# Patient Record
Sex: Female | Born: 1945
Health system: Southern US, Community
[De-identification: ages and names within clinical notes are randomized; demographics above are authoritative.]

## PROBLEM LIST (undated history)

## (undated) DIAGNOSIS — K219 Gastro-esophageal reflux disease without esophagitis: Secondary | ICD-10-CM

## (undated) DIAGNOSIS — I10 Essential (primary) hypertension: Secondary | ICD-10-CM

## (undated) DIAGNOSIS — E049 Nontoxic goiter, unspecified: Secondary | ICD-10-CM

## (undated) DIAGNOSIS — R229 Localized swelling, mass and lump, unspecified: Secondary | ICD-10-CM

## (undated) DIAGNOSIS — J40 Bronchitis, not specified as acute or chronic: Secondary | ICD-10-CM

## (undated) DIAGNOSIS — C4491 Basal cell carcinoma of skin, unspecified: Secondary | ICD-10-CM

## (undated) DIAGNOSIS — Z5189 Encounter for other specified aftercare: Secondary | ICD-10-CM

## (undated) DIAGNOSIS — D869 Sarcoidosis, unspecified: Secondary | ICD-10-CM

## (undated) DIAGNOSIS — T7840XA Allergy, unspecified, initial encounter: Secondary | ICD-10-CM

## (undated) DIAGNOSIS — E079 Disorder of thyroid, unspecified: Secondary | ICD-10-CM

## (undated) DIAGNOSIS — B019 Varicella without complication: Secondary | ICD-10-CM

## (undated) DIAGNOSIS — J302 Other seasonal allergic rhinitis: Secondary | ICD-10-CM

## (undated) HISTORY — DX: Basal cell carcinoma of skin, unspecified: C44.91

## (undated) HISTORY — PX: COSMETIC SURGERY: SHX468

## (undated) HISTORY — PX: JOINT REPLACEMENT: SHX530

## (undated) HISTORY — DX: Localized swelling, mass and lump, unspecified: R22.9

## (undated) HISTORY — DX: Sarcoidosis, unspecified: D86.9

## (undated) HISTORY — DX: Allergy, unspecified, initial encounter: T78.40XA

## (undated) HISTORY — DX: Nontoxic goiter, unspecified: E04.9

## (undated) HISTORY — DX: Disorder of thyroid, unspecified: E07.9

## (undated) HISTORY — DX: Varicella without complication: B01.9

## (undated) HISTORY — PX: CHOLECYSTECTOMY: SHX55

## (undated) HISTORY — DX: Gastro-esophageal reflux disease without esophagitis: K21.9

## (undated) HISTORY — PX: FRACTURE SURGERY: SHX138

## (undated) HISTORY — DX: Encounter for other specified aftercare: Z51.89

## (undated) HISTORY — DX: Other seasonal allergic rhinitis: J30.2

## (undated) HISTORY — DX: Bronchitis, not specified as acute or chronic: J40

## (undated) HISTORY — PX: POLYPECTOMY: SHX149

## (undated) HISTORY — DX: Essential (primary) hypertension: I10

---
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
## 1976-12-27 HISTORY — PX: GALLBLADDER SURGERY: SHX652

## 1976-12-27 HISTORY — PX: APPENDECTOMY: SHX54

## 1977-12-27 HISTORY — PX: CARPAL TUNNEL RELEASE: SHX101

## 1978-12-27 HISTORY — PX: CARPAL TUNNEL RELEASE: SHX101

## 1981-12-27 HISTORY — PX: TUBAL LIGATION: SHX77

## 1990-12-27 HISTORY — PX: ABDOMINAL HYSTERECTOMY: SHX81

## 2000-09-12 ENCOUNTER — Encounter: Admission: RE | Admit: 2000-09-12 | Discharge: 2000-09-12 | Payer: Self-pay | Admitting: Family Medicine

## 2000-09-12 ENCOUNTER — Encounter: Payer: Self-pay | Admitting: Family Medicine

## 2001-09-13 ENCOUNTER — Encounter: Payer: Self-pay | Admitting: Family Medicine

## 2001-09-13 ENCOUNTER — Encounter: Admission: RE | Admit: 2001-09-13 | Discharge: 2001-09-13 | Payer: Self-pay | Admitting: Family Medicine

## 2002-10-22 ENCOUNTER — Encounter: Admission: RE | Admit: 2002-10-22 | Discharge: 2002-10-22 | Payer: Self-pay | Admitting: Orthopedic Surgery

## 2002-10-24 ENCOUNTER — Encounter (INDEPENDENT_AMBULATORY_CARE_PROVIDER_SITE_OTHER): Payer: Self-pay | Admitting: *Deleted

## 2002-10-24 ENCOUNTER — Ambulatory Visit (HOSPITAL_BASED_OUTPATIENT_CLINIC_OR_DEPARTMENT_OTHER): Admission: RE | Admit: 2002-10-24 | Discharge: 2002-10-24 | Payer: Self-pay | Admitting: Orthopedic Surgery

## 2002-11-01 ENCOUNTER — Encounter: Admission: RE | Admit: 2002-11-01 | Discharge: 2002-11-01 | Payer: Self-pay | Admitting: Family Medicine

## 2002-11-01 ENCOUNTER — Encounter: Payer: Self-pay | Admitting: Family Medicine

## 2003-11-12 ENCOUNTER — Encounter: Admission: RE | Admit: 2003-11-12 | Discharge: 2003-11-12 | Payer: Self-pay | Admitting: Family Medicine

## 2004-01-03 ENCOUNTER — Ambulatory Visit (HOSPITAL_COMMUNITY): Admission: RE | Admit: 2004-01-03 | Discharge: 2004-01-03 | Payer: Self-pay | Admitting: General Surgery

## 2004-01-03 ENCOUNTER — Encounter (INDEPENDENT_AMBULATORY_CARE_PROVIDER_SITE_OTHER): Payer: Self-pay | Admitting: Specialist

## 2004-11-10 ENCOUNTER — Ambulatory Visit: Payer: Self-pay | Admitting: Cardiovascular Disease

## 2004-11-11 ENCOUNTER — Ambulatory Visit: Payer: Self-pay | Admitting: Internal Medicine

## 2004-11-20 ENCOUNTER — Ambulatory Visit: Payer: Self-pay

## 2004-11-26 LAB — HM COLONOSCOPY

## 2004-11-27 ENCOUNTER — Ambulatory Visit: Payer: Self-pay | Admitting: Internal Medicine

## 2004-12-18 ENCOUNTER — Encounter: Admission: RE | Admit: 2004-12-18 | Discharge: 2004-12-18 | Payer: Self-pay | Admitting: Internal Medicine

## 2005-06-26 DIAGNOSIS — C4491 Basal cell carcinoma of skin, unspecified: Secondary | ICD-10-CM

## 2005-06-26 HISTORY — DX: Basal cell carcinoma of skin, unspecified: C44.91

## 2005-12-03 ENCOUNTER — Other Ambulatory Visit: Admission: RE | Admit: 2005-12-03 | Discharge: 2005-12-03 | Payer: Self-pay | Admitting: Endocrinology

## 2005-12-03 ENCOUNTER — Encounter (INDEPENDENT_AMBULATORY_CARE_PROVIDER_SITE_OTHER): Payer: Self-pay | Admitting: Specialist

## 2005-12-03 ENCOUNTER — Ambulatory Visit: Payer: Self-pay | Admitting: Endocrinology

## 2006-12-27 LAB — HM PAP SMEAR

## 2007-12-28 DIAGNOSIS — IMO0002 Reserved for concepts with insufficient information to code with codable children: Secondary | ICD-10-CM

## 2007-12-28 HISTORY — PX: TIBIA FRACTURE SURGERY: SHX806

## 2007-12-28 HISTORY — DX: Reserved for concepts with insufficient information to code with codable children: IMO0002

## 2008-07-19 ENCOUNTER — Ambulatory Visit (HOSPITAL_COMMUNITY): Admission: RE | Admit: 2008-07-19 | Discharge: 2008-07-19 | Payer: Self-pay | Admitting: Orthopedic Surgery

## 2008-09-12 ENCOUNTER — Ambulatory Visit: Payer: Self-pay | Admitting: Oncology

## 2008-11-12 ENCOUNTER — Ambulatory Visit: Payer: Self-pay | Admitting: Oncology

## 2008-11-14 ENCOUNTER — Ambulatory Visit (HOSPITAL_COMMUNITY): Admission: RE | Admit: 2008-11-14 | Discharge: 2008-11-14 | Payer: Self-pay | Admitting: Oncology

## 2008-11-14 LAB — CBC WITH DIFFERENTIAL/PLATELET
BASO%: 0.3 % (ref 0.0–2.0)
Basophils Absolute: 0 10*3/uL (ref 0.0–0.1)
EOS%: 1.6 % (ref 0.0–7.0)
Eosinophils Absolute: 0.1 10*3/uL (ref 0.0–0.5)
HCT: 39.9 % (ref 34.8–46.6)
HGB: 13.6 g/dL (ref 11.6–15.9)
LYMPH%: 31.5 % (ref 14.0–48.0)
MCH: 30.5 pg (ref 26.0–34.0)
MCHC: 34.1 g/dL (ref 32.0–36.0)
MCV: 89.4 fL (ref 81.0–101.0)
MONO#: 0.5 10*3/uL (ref 0.1–0.9)
MONO%: 7.1 % (ref 0.0–13.0)
NEUT#: 4.2 10*3/uL (ref 1.5–6.5)
NEUT%: 59.5 % (ref 39.6–76.8)
Platelets: 357 10*3/uL (ref 145–400)
RBC: 4.46 10*6/uL (ref 3.70–5.32)
RDW: 14.4 % (ref 11.3–14.5)
WBC: 7.1 10*3/uL (ref 3.9–10.0)
lymph#: 2.2 10*3/uL (ref 0.9–3.3)

## 2008-11-18 LAB — COMPREHENSIVE METABOLIC PANEL
ALT: 23 U/L (ref 0–35)
AST: 17 U/L (ref 0–37)
Albumin: 4.5 g/dL (ref 3.5–5.2)
Alkaline Phosphatase: 90 U/L (ref 39–117)
BUN: 14 mg/dL (ref 6–23)
CO2: 25 mEq/L (ref 19–32)
Calcium: 9.5 mg/dL (ref 8.4–10.5)
Chloride: 106 mEq/L (ref 96–112)
Creatinine, Ser: 0.72 mg/dL (ref 0.40–1.20)
Glucose, Bld: 110 mg/dL — ABNORMAL HIGH (ref 70–99)
Potassium: 3.9 mEq/L (ref 3.5–5.3)
Sodium: 142 mEq/L (ref 135–145)
Total Bilirubin: 0.3 mg/dL (ref 0.3–1.2)
Total Protein: 7.1 g/dL (ref 6.0–8.3)

## 2008-11-18 LAB — SPEP & IFE WITH QIG
Albumin ELP: 60.6 % (ref 55.8–66.1)
Alpha-1-Globulin: 4.4 % (ref 2.9–4.9)
Alpha-2-Globulin: 10.6 % (ref 7.1–11.8)
Beta 2: 5 % (ref 3.2–6.5)
Beta Globulin: 6.9 % (ref 4.7–7.2)
Gamma Globulin: 12.5 % (ref 11.1–18.8)
IgA: 136 mg/dL (ref 68–378)
IgG (Immunoglobin G), Serum: 870 mg/dL (ref 694–1618)
IgM, Serum: 138 mg/dL (ref 60–263)
Total Protein, Serum Electrophoresis: 7.1 g/dL (ref 6.0–8.3)

## 2008-11-18 LAB — ANGIOTENSIN CONVERTING ENZYME: Angiotensin 1 Converting Enzyme: 16 U/L (ref 9–67)

## 2008-11-18 LAB — LACTATE DEHYDROGENASE: LDH: 161 U/L (ref 94–250)

## 2009-01-01 ENCOUNTER — Ambulatory Visit (HOSPITAL_COMMUNITY): Admission: RE | Admit: 2009-01-01 | Discharge: 2009-01-01 | Payer: Self-pay | Admitting: Oncology

## 2009-01-30 ENCOUNTER — Ambulatory Visit: Payer: Self-pay | Admitting: Oncology

## 2009-07-25 ENCOUNTER — Ambulatory Visit: Payer: Self-pay | Admitting: Oncology

## 2009-07-29 LAB — BASIC METABOLIC PANEL
BUN: 16 mg/dL (ref 6–23)
CO2: 28 mEq/L (ref 19–32)
Calcium: 9.6 mg/dL (ref 8.4–10.5)
Chloride: 106 mEq/L (ref 96–112)
Creatinine, Ser: 0.79 mg/dL (ref 0.40–1.20)
Glucose, Bld: 103 mg/dL — ABNORMAL HIGH (ref 70–99)
Potassium: 3.6 mEq/L (ref 3.5–5.3)
Sodium: 141 mEq/L (ref 135–145)

## 2009-08-06 ENCOUNTER — Ambulatory Visit (HOSPITAL_COMMUNITY): Admission: RE | Admit: 2009-08-06 | Discharge: 2009-08-06 | Payer: Self-pay | Admitting: Oncology

## 2009-08-19 ENCOUNTER — Ambulatory Visit: Payer: Self-pay | Admitting: Oncology

## 2009-11-17 ENCOUNTER — Inpatient Hospital Stay (HOSPITAL_COMMUNITY): Admission: RE | Admit: 2009-11-17 | Discharge: 2009-11-19 | Payer: Self-pay | Admitting: Urology

## 2009-11-17 ENCOUNTER — Encounter (INDEPENDENT_AMBULATORY_CARE_PROVIDER_SITE_OTHER): Payer: Self-pay | Admitting: Urology

## 2009-12-27 HISTORY — PX: OTHER SURGICAL HISTORY: SHX169

## 2011-01-13 LAB — CBC AND DIFFERENTIAL
HCT: 44 % (ref 36–46)
Hemoglobin: 14.7 g/dL (ref 12.0–16.0)
Platelets: 364 10*3/uL (ref 150–399)
WBC: 6.4 10^3/mL

## 2011-01-13 LAB — LIPID PANEL
Cholesterol: 178 mg/dL (ref 0–200)
HDL: 78 mg/dL — AB (ref 35–70)
LDL Cholesterol: 86 mg/dL
LDl/HDL Ratio: 2.3
Triglycerides: 72 mg/dL (ref 40–160)

## 2011-01-13 LAB — TSH: TSH: 0.83 u[IU]/mL (ref <=5.90)

## 2011-01-13 LAB — HEPATIC FUNCTION PANEL
ALT: 21 U/L (ref 7–35)
AST: 20 U/L (ref 13–35)
Alkaline Phosphatase: 71 U/L (ref 25–125)
Bilirubin, Total: 0.4 mg/dL

## 2011-01-13 LAB — HEMOGLOBIN A1C: Hgb A1c MFr Bld: 5.8 % (ref 4.0–6.0)

## 2011-01-17 ENCOUNTER — Encounter: Payer: Self-pay | Admitting: Oncology

## 2011-02-03 LAB — HM DEXA SCAN

## 2011-02-03 LAB — HM MAMMOGRAPHY: HM Mammogram: NORMAL

## 2011-03-31 LAB — CBC
HCT: 39.4 % (ref 36.0–46.0)
Hemoglobin: 13.1 g/dL (ref 12.0–15.0)
MCHC: 33.2 g/dL (ref 30.0–36.0)
MCV: 89.8 fL (ref 78.0–100.0)
Platelets: 375 10*3/uL (ref 150–400)
RBC: 4.39 MIL/uL (ref 3.87–5.11)
RDW: 13.4 % (ref 11.5–15.5)
WBC: 5.4 10*3/uL (ref 4.0–10.5)

## 2011-03-31 LAB — BASIC METABOLIC PANEL
BUN: 15 mg/dL (ref 6–23)
BUN: 6 mg/dL (ref 6–23)
BUN: 9 mg/dL (ref 6–23)
CO2: 26 mEq/L (ref 19–32)
CO2: 27 mEq/L (ref 19–32)
CO2: 30 mEq/L (ref 19–32)
Calcium: 8.1 mg/dL — ABNORMAL LOW (ref 8.4–10.5)
Calcium: 8.3 mg/dL — ABNORMAL LOW (ref 8.4–10.5)
Calcium: 9.2 mg/dL (ref 8.4–10.5)
Chloride: 104 mEq/L (ref 96–112)
Chloride: 106 mEq/L (ref 96–112)
Chloride: 107 mEq/L (ref 96–112)
Creatinine, Ser: 0.78 mg/dL (ref 0.4–1.2)
Creatinine, Ser: 0.88 mg/dL (ref 0.4–1.2)
Creatinine, Ser: 0.88 mg/dL (ref 0.4–1.2)
GFR calc Af Amer: >60 mL/min (ref >60)
GFR calc Af Amer: >60 mL/min (ref >60)
GFR calc Af Amer: >60 mL/min (ref >60)
GFR calc non Af Amer: >60 mL/min (ref >60)
GFR calc non Af Amer: >60 mL/min (ref >60)
GFR calc non Af Amer: >60 mL/min (ref >60)
Glucose, Bld: 126 mg/dL — ABNORMAL HIGH (ref 70–99)
Glucose, Bld: 133 mg/dL — ABNORMAL HIGH (ref 70–99)
Glucose, Bld: 86 mg/dL (ref 70–99)
Potassium: 3.6 mEq/L (ref 3.5–5.1)
Potassium: 3.7 mEq/L (ref 3.5–5.1)
Potassium: 4 mEq/L (ref 3.5–5.1)
Sodium: 139 mEq/L (ref 135–145)
Sodium: 140 mEq/L (ref 135–145)
Sodium: 142 mEq/L (ref 135–145)

## 2011-03-31 LAB — ABO/RH: ABO/RH(D): O POS

## 2011-03-31 LAB — TYPE AND SCREEN
ABO/RH(D): O POS
Antibody Screen: NEGATIVE

## 2011-03-31 LAB — CREATININE, FLUID (PLEURAL, PERITONEAL, JP DRAINAGE): Creat, Fluid: 0.8 mg/dL

## 2011-03-31 LAB — HEMOGLOBIN AND HEMATOCRIT, BLOOD
HCT: 30.7 % — ABNORMAL LOW (ref 36.0–46.0)
HCT: 32.8 % — ABNORMAL LOW (ref 36.0–46.0)
Hemoglobin: 10.3 g/dL — ABNORMAL LOW (ref 12.0–15.0)
Hemoglobin: 11 g/dL — ABNORMAL LOW (ref 12.0–15.0)

## 2011-05-11 NOTE — Op Note (Signed)
Kristina Mann, Kristina Mann               ACCOUNT NO.:  [id number]   MEDICAL RECORD NO.:  [id number]          PATIENT TYPE:  AMB   LOCATION:  SDS                          FACILITY:  [facility]   PHYSICIAN:  Doralee Albino. Carola Frost, M.D. DATE OF BIRTH:  [birth year]   DATE OF PROCEDURE:  DATE OF DISCHARGE:  07/19/2008                               OPERATIVE REPORT   PREOPERATIVE DIAGNOSES:  1. Retained external fixator, left ankle.  2. Soft tissue wounds with retained dressings, left thigh.   POSTOPERATIVE DIAGNOSES:  1. Retained external fixator, left ankle.  2. Soft tissue wounds with retained dressings, left thigh.   PROCEDURES:  1. Removal of external fixator under anesthesia.  2. Debridement of skin only, left thigh and left leg.   SURGEON:  Doralee Albino. Carola Frost, M.D.   ASSISTANT:  Mearl Latin, [place].   ANESTHESIA:  General   COMPLICATIONS:  None.   DISPOSITION:  PACU.   CONDITION:  Stable.   INDICATIONS FOR PROCEDURE:  Kristina Mann is a 65 year old female,  status post severe degloving injury to her left leg, which was treated  in an outside hospital with ORIF of the open fibula, initial antibiotic  spacer, and then rotational flap and split-thickness skin grafting.  We  discussed with her the risks and benefits of removal of the fixator  including loss of reduction, pin site infection, and we also discussed  removal of her adherent left thigh dressing and cleaning of her soft  tissue wounds about the donor and recipient sites.  After full  discussion, both she and her husband wished to proceed.   DESCRIPTION OF PROCEDURE:  Kristina Mann was administered preop  antibiotics, taken to operating room where general anesthesia was  induced and an LMA placed.  The left lower extremity then underwent  removal of the external fixator including pins and bars.  We also  removed the adherent aqua cell dressing about the about the thigh.  Underneath, there was hypertrophic skin, some foul odor and a  slight  purulence, which seemed to have accumulated within the contracted areas  of the dressing.  Distally, there was significant amount of dead skin  and scabbing.  We were able to remove the adherent scab and soft  tissues.  We did use a chlorhexidine scrub brush to further debride some  of this material while she remained under anesthesia.  At that point,  Adaptic and gauze dressing followed by an Ace wrap was applied.  The  patient was awakened from the anesthesia and transported to PACU in  stable condition.   PROGNOSIS:  Kristina Mann will be weightbearing as tolerated in a Cam  boot.  She will undergo dressing changes daily with Adaptic and gauze.  She will not require any formal DVT prophylaxis as the extractor to be  mobile at this time.  We will plan to see her back in the office in  about 10 days, and at that time, hope to wean her from her boot and  began PT supervised active and passive motion.  She will contact in the  interim, if any problems,  concerns, or questions.      Doralee Albino. Carola Frost, M.D.  Electronically Signed     MHH/MEDQ  D:  07/19/2008  T:  07/20/2008  Job:  [id number]

## 2011-05-14 NOTE — Op Note (Signed)
   NAME:  Kristina Mann, BAYER                         ACCOUNT NO.:  [id number]   MEDICAL RECORD NO.:  [id number]                   PATIENT TYPE:  AMB   LOCATION:  DSC                                  FACILITY:  [facility]   PHYSICIAN:  Artist Pais. Mina Marble, M.D.           DATE OF BIRTH:  1946/04/26   DATE OF PROCEDURE:  10/24/2002  DATE OF DISCHARGE:                                 OPERATIVE REPORT   PREOPERATIVE DIAGNOSIS:  Left wrist volar mass.   POSTOPERATIVE DIAGNOSIS:  Left wrist volar mass.   PROCEDURE:  Excisional biopsy of left wrist volar mass.   SURGEON:  Artist Pais. Mina Marble, M.D.   ASSISTANT:  RN.   ANESTHESIA:  Regional block.   TOURNIQUET TIME:  30 minutes.   COMPLICATIONS:  None.   DRAINS:  None.   SPECIMEN SENT:  None sent.   OPERATIVE REPORT:  The patient was taken to the operating room and after the  induction of adequate anesthesia, and the left upper extremity was prepped  and draped in the usual sterile fashion.  Once this was done a Lorrene Reid type  incision was made over the radial aspect of the left wrist and a large  radial based flap was elevated.  A large ventral artery was identified and  carefully dissected off of the mass.  The mass was dissected down to the FCR  sheath and removed in its entirety.  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  A complete debridement was  undertaken of the FCR sheath in this area.  The wound was then thoroughly  irrigated and loosely closed with a running 3-0 Prolene subcuticular stitch.  It was dressed with 4 x 4's, fluffs and a compressive dressing was applied.  The patient tolerated the procedure well and returned to the recovery room  in stable condition.                                               Artist Pais Mina Marble, M.D.    MAW/MEDQ  D:  10/24/2002  T:  10/24/2002  Job:  [id number]

## 2011-07-19 LAB — BASIC METABOLIC PANEL
BUN: 19 mg/dL (ref 4–21)
Creatinine: 0.9 mg/dL (ref <=1.1)
Glucose: 90 mg/dL
Potassium: 4.4 mmol/L (ref 3.4–5.3)
Sodium: 139 mmol/L (ref 137–147)

## 2011-09-24 LAB — CBC
HCT: 40.7
Hemoglobin: 13.5
MCHC: 33.2
MCV: 86.3
Platelets: 368
RBC: 4.72
RDW: 16 — ABNORMAL HIGH
WBC: 7.8

## 2011-09-24 LAB — BASIC METABOLIC PANEL
BUN: 8
CO2: 26
Calcium: 9.5
Chloride: 105
Creatinine, Ser: 0.58
GFR calc Af Amer: >60
GFR calc non Af Amer: >60
Glucose, Bld: 99
Potassium: 3.8
Sodium: 139

## 2012-01-20 ENCOUNTER — Encounter: Payer: Self-pay | Admitting: Internal Medicine

## 2012-01-20 ENCOUNTER — Encounter: Payer: Self-pay | Admitting: Family Medicine

## 2012-01-20 DIAGNOSIS — I10 Essential (primary) hypertension: Secondary | ICD-10-CM

## 2012-01-20 DIAGNOSIS — E041 Nontoxic single thyroid nodule: Secondary | ICD-10-CM

## 2012-01-20 DIAGNOSIS — J302 Other seasonal allergic rhinitis: Secondary | ICD-10-CM

## 2012-01-20 DIAGNOSIS — C4491 Basal cell carcinoma of skin, unspecified: Secondary | ICD-10-CM | POA: Insufficient documentation

## 2012-01-20 DIAGNOSIS — D869 Sarcoidosis, unspecified: Secondary | ICD-10-CM

## 2012-01-31 ENCOUNTER — Encounter: Payer: Self-pay | Admitting: Internal Medicine

## 2012-02-05 ENCOUNTER — Encounter: Payer: Self-pay | Admitting: *Deleted

## 2012-02-07 ENCOUNTER — Ambulatory Visit (INDEPENDENT_AMBULATORY_CARE_PROVIDER_SITE_OTHER): Payer: BC Managed Care – PPO | Admitting: Internal Medicine

## 2012-02-07 ENCOUNTER — Encounter: Payer: Self-pay | Admitting: Internal Medicine

## 2012-02-07 DIAGNOSIS — C44300 Unspecified malignant neoplasm of skin of unspecified part of face: Secondary | ICD-10-CM

## 2012-02-07 DIAGNOSIS — Z79899 Other long term (current) drug therapy: Secondary | ICD-10-CM

## 2012-02-07 DIAGNOSIS — I10 Essential (primary) hypertension: Secondary | ICD-10-CM

## 2012-02-07 DIAGNOSIS — E041 Nontoxic single thyroid nodule: Secondary | ICD-10-CM

## 2012-02-07 DIAGNOSIS — D869 Sarcoidosis, unspecified: Secondary | ICD-10-CM

## 2012-02-07 DIAGNOSIS — E042 Nontoxic multinodular goiter: Secondary | ICD-10-CM

## 2012-02-07 DIAGNOSIS — Z0000 Encounter for general adult medical examination without abnormal findings: Secondary | ICD-10-CM

## 2012-02-07 LAB — CBC WITH DIFFERENTIAL/PLATELET
Basophils Absolute: 0.1 10*3/uL (ref 0.0–0.1)
Basophils Relative: 1 % (ref 0–1)
Eosinophils Absolute: 0.3 10*3/uL (ref 0.0–0.7)
Eosinophils Relative: 3 % (ref 0–5)
HCT: 43.1 % (ref 36.0–46.0)
Hemoglobin: 14.5 g/dL (ref 12.0–15.0)
Lymphocytes Relative: 31 % (ref 12–46)
Lymphs Abs: 2.4 10*3/uL (ref 0.7–4.0)
MCH: 29.4 pg (ref 26.0–34.0)
MCHC: 33.6 g/dL (ref 30.0–36.0)
MCV: 87.4 fL (ref 78.0–100.0)
Monocytes Absolute: 0.5 10*3/uL (ref 0.1–1.0)
Monocytes Relative: 7 % (ref 3–12)
Neutro Abs: 4.5 10*3/uL (ref 1.7–7.7)
Neutrophils Relative %: 58 % (ref 43–77)
Platelets: 343 10*3/uL (ref 150–400)
RBC: 4.93 MIL/uL (ref 3.87–5.11)
RDW: 14.6 % (ref 11.5–15.5)
WBC: 7.7 10*3/uL (ref 4.0–10.5)

## 2012-02-07 LAB — COMPREHENSIVE METABOLIC PANEL
ALT: 20 U/L (ref 0–35)
AST: 21 U/L (ref 0–37)
Albumin: 4.1 g/dL (ref 3.5–5.2)
Alkaline Phosphatase: 62 U/L (ref 39–117)
BUN: 19 mg/dL (ref 6–23)
CO2: 27 mEq/L (ref 19–32)
Calcium: 9.5 mg/dL (ref 8.4–10.5)
Chloride: 104 mEq/L (ref 96–112)
Creat: 0.83 mg/dL (ref 0.50–1.10)
Glucose, Bld: 95 mg/dL (ref 70–99)
Potassium: 4.1 mEq/L (ref 3.5–5.3)
Sodium: 141 mEq/L (ref 135–145)
Total Bilirubin: 0.4 mg/dL (ref 0.3–1.2)
Total Protein: 6.2 g/dL (ref 6.0–8.3)

## 2012-02-07 LAB — LIPID PANEL
Cholesterol: 163 mg/dL (ref 0–200)
HDL: 68 mg/dL (ref >39)
LDL Cholesterol: 79 mg/dL (ref 0–99)
Total CHOL/HDL Ratio: 2.4 Ratio
Triglycerides: 81 mg/dL (ref <150)
VLDL: 16 mg/dL (ref 0–40)

## 2012-02-07 LAB — TSH: TSH: 0.583 u[IU]/mL (ref 0.350–4.500)

## 2012-02-07 NOTE — Progress Notes (Signed)
  Subjective:    Patient ID: Kristina Mann, female    DOB: 06/15/1946, 66 y.o.   MRN: [id number]  HPISee scanned hx, ros complete done today    Review of Systems    see above Objective:   Physical Exam  Constitutional: She is oriented to person, place, and time. She appears well-developed and well-nourished.  HENT:  Head: Normocephalic.  Right Ear: External ear normal.  Left Ear: External ear normal.  Nose: Nose normal.  Mouth/Throat: Oropharynx is clear and moist. No oropharyngeal exudate.  Eyes: EOM are normal. Pupils are equal, round, and reactive to light.  Neck: Normal range of motion. Neck supple. Thyromegaly present.  Cardiovascular: Normal rate, regular rhythm and normal heart sounds.   Pulmonary/Chest: Effort normal and breath sounds normal.  Abdominal: Soft. Bowel sounds are normal. She exhibits no mass. There is no tenderness.  Musculoskeletal: Normal range of motion.  Neurological: She is alert and oriented to person, place, and time. She has normal reflexes. No cranial nerve deficit. Coordination normal.  Skin: Skin is warm. No rash noted.  Psychiatric: She has a normal mood and affect.          Assessment & Plan:  HTN, Thyroid nodules, sarcoidosis all stable controlled.  Tolerates meds.  Refill meds one year.

## 2012-03-03 ENCOUNTER — Other Ambulatory Visit: Payer: Self-pay | Admitting: Internal Medicine

## 2012-03-03 LAB — IFOBT (OCCULT BLOOD): IFOBT: NEGATIVE

## 2012-04-05 ENCOUNTER — Ambulatory Visit (INDEPENDENT_AMBULATORY_CARE_PROVIDER_SITE_OTHER): Payer: BC Managed Care – PPO | Admitting: Internal Medicine

## 2012-04-05 VITALS — BP 141/70 | HR 78 | Temp 98.2°F | Resp 16 | Ht 61.0 in | Wt 174.0 lb

## 2012-04-05 DIAGNOSIS — J309 Allergic rhinitis, unspecified: Secondary | ICD-10-CM

## 2012-04-05 DIAGNOSIS — R05 Cough: Secondary | ICD-10-CM

## 2012-04-05 DIAGNOSIS — J45909 Unspecified asthma, uncomplicated: Secondary | ICD-10-CM

## 2012-04-05 DIAGNOSIS — R059 Cough, unspecified: Secondary | ICD-10-CM

## 2012-04-05 MED ORDER — ALBUTEROL SULFATE HFA 108 (90 BASE) MCG/ACT IN AERS: 2.0000 | INHALATION_SPRAY | Freq: Four times a day (QID) | RESPIRATORY_TRACT | Status: DC | PRN

## 2012-04-05 MED ORDER — ALBUTEROL SULFATE (2.5 MG/3ML) 0.083% IN NEBU
2.5000 mg | INHALATION_SOLUTION | Freq: Once | RESPIRATORY_TRACT | Status: AC
  Administered 2012-04-05: 2.5 mg via RESPIRATORY_TRACT

## 2012-04-05 MED ORDER — PREDNISONE 20 MG PO TABS: ORAL_TABLET | ORAL | Status: DC

## 2012-04-05 NOTE — Progress Notes (Signed)
  Subjective:    Patient ID: Kristina Mann, female    DOB: 06/21/1946, 66 y.o.   MRN: [id number]  Cough This is a new problem. The current episode started in the past 7 days. The problem has been unchanged. The cough is non-productive. Associated symptoms include nasal congestion and rhinorrhea. Pertinent negatives include no chest pain, chills, fever or heartburn. The symptoms are aggravated by lying down. Her past medical history is significant for environmental allergies.  [name] is here with complaints of a dry cough, scratchy throat for 2-3 days.  She is taking an OTC allergy medicine.  She uses her Advair inhaler once daily and has been using that for several years for her sarcoidosis.  She has felt feverish but her temperature was 98.7.  She has not had any sick contacts.  Her health is excellent and she recently saw Dr. Perrin Maltese in February for her CPE, she has never had a pulmonology consult.    Review of Systems  Constitutional: Negative for fever and chills.  HENT: Positive for rhinorrhea.   Respiratory: Positive for cough.   Cardiovascular: Negative for chest pain.  Gastrointestinal: Negative for heartburn.  Hematological: Positive for environmental allergies.  All other systems reviewed and are negative.       Objective:   Physical Exam  Vitals reviewed. Constitutional: She is oriented to person, place, and time. She appears well-developed and well-nourished.  HENT:  Head: Normocephalic and atraumatic.  Right Ear: External ear normal.  Eyes: Conjunctivae are normal.  Neck: Neck supple.  Cardiovascular: Normal rate, regular rhythm and normal heart sounds.   Pulmonary/Chest: Effort normal and breath sounds normal. No respiratory distress. She has no wheezes. She has no rales. She exhibits no tenderness.  Abdominal: Soft.  Lymphadenopathy:    She has no cervical adenopathy.  Neurological: She is alert and oriented to person, place, and time.  Skin: Skin is warm and  dry.  Psychiatric: She has a normal mood and affect. Her behavior is normal.          Assessment & Plan:  Her exam is entirely normal today, her oxygen saturation was unchanged after an albuterol neb treatment.  I suspect she has some RAD and so I have written a six day prednisone taper for her to take and asked her to increase her Advair inhalations to twice daily during this month of worse pollen, she agrees to this plan.  AVS printed and given to pt and she will RTC if not improved in 2-3 days.

## 2012-04-05 NOTE — Patient Instructions (Signed)
Increase your Advair to twice daily, also take your prednisone with food for a 6 day taper, let us know if your symptoms are not improving or get worse in 2-3 days.  Allergic Rhinitis Allergic rhinitis is when the mucous membranes in the nose respond to allergens. Allergens are particles in the air that cause your body to have an allergic reaction. This causes you to release allergic antibodies. Through a chain of events, these eventually cause you to release histamine into the blood stream (hence the use of antihistamines). Although meant to be protective to the body, it is this release that causes your discomfort, such as frequent sneezing, congestion and an itchy runny nose.  CAUSES  The pollen allergens may come from grasses, trees, and weeds. This is seasonal allergic rhinitis, or "hay fever." Other allergens cause year-round allergic rhinitis (perennial allergic rhinitis) such as house dust mite allergen, pet dander and mold spores.  SYMPTOMS   Nasal stuffiness (congestion).   Runny, itchy nose with sneezing and tearing of the eyes.   There is often an itching of the mouth, eyes and ears.  It cannot be cured, but it can be controlled with medications. DIAGNOSIS  If you are unable to determine the offending allergen, skin or blood testing may find it. TREATMENT   Avoid the allergen.   Medications and allergy shots (immunotherapy) can help.   Hay fever may often be treated with antihistamines in pill or nasal spray forms. Antihistamines block the effects of histamine. There are over-the-counter medicines that may help with nasal congestion and swelling around the eyes. Check with your caregiver before taking or giving this medicine.  If the treatment above does not work, there are many new medications your caregiver can prescribe. Stronger medications may be used if initial measures are ineffective. Desensitizing injections can be used if medications and avoidance fails. Desensitization is  when a patient is given ongoing shots until the body becomes less sensitive to the allergen. Make sure you follow up with your caregiver if problems continue. SEEK MEDICAL CARE IF:   You develop fever (more than 100.5 F (38.1 C).   You develop a cough that does not stop easily (persistent).   You have shortness of breath.   You start wheezing.   Symptoms interfere with normal daily activities.  Document Released: 09/07/2001 Document Revised: 12/02/2011 Document Reviewed: 03/19/2009 [facility] Patient Information 2012 [place], [place].

## 2012-04-24 ENCOUNTER — Other Ambulatory Visit: Payer: Self-pay | Admitting: Internal Medicine

## 2012-08-14 ENCOUNTER — Encounter: Payer: Self-pay | Admitting: Internal Medicine

## 2012-08-14 ENCOUNTER — Ambulatory Visit (INDEPENDENT_AMBULATORY_CARE_PROVIDER_SITE_OTHER): Payer: BC Managed Care – PPO | Admitting: Internal Medicine

## 2012-08-14 VITALS — BP 120/72 | HR 80 | Temp 98.2°F | Resp 16 | Ht 61.0 in | Wt 176.0 lb

## 2012-08-14 DIAGNOSIS — I10 Essential (primary) hypertension: Secondary | ICD-10-CM

## 2012-08-14 DIAGNOSIS — J301 Allergic rhinitis due to pollen: Secondary | ICD-10-CM

## 2012-08-14 DIAGNOSIS — E042 Nontoxic multinodular goiter: Secondary | ICD-10-CM

## 2012-08-14 DIAGNOSIS — Z789 Other specified health status: Secondary | ICD-10-CM

## 2012-08-14 DIAGNOSIS — J45902 Unspecified asthma with status asthmaticus: Secondary | ICD-10-CM

## 2012-08-14 DIAGNOSIS — Z79899 Other long term (current) drug therapy: Secondary | ICD-10-CM

## 2012-08-14 DIAGNOSIS — J45909 Unspecified asthma, uncomplicated: Secondary | ICD-10-CM

## 2012-08-14 DIAGNOSIS — E049 Nontoxic goiter, unspecified: Secondary | ICD-10-CM

## 2012-08-14 LAB — BASIC METABOLIC PANEL
BUN: 16 mg/dL (ref 6–23)
CO2: 29 mEq/L (ref 19–32)
Calcium: 9.8 mg/dL (ref 8.4–10.5)
Chloride: 105 mEq/L (ref 96–112)
Creat: 0.86 mg/dL (ref 0.50–1.10)
Glucose, Bld: 100 mg/dL — ABNORMAL HIGH (ref 70–99)
Potassium: 4.7 mEq/L (ref 3.5–5.3)
Sodium: 142 mEq/L (ref 135–145)

## 2012-08-14 NOTE — Progress Notes (Signed)
  Subjective:    Patient ID: Kristina Mann, female    DOB: 1946/08/27, 66 y.o.   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 HTN controlled. Allergy/asthma controlled.   Review of Systems stable    Objective:   Physical Exam  Constitutional: She is oriented to person, place, and time. She appears well-developed and well-nourished.  HENT:  Nose: Nose normal.  Mouth/Throat: Oropharynx is clear and moist.  Eyes: EOM are normal.  Neck: Neck supple. No tracheal deviation present. Thyromegaly present.  Cardiovascular: Normal rate, regular rhythm and normal heart sounds.   Pulmonary/Chest: Effort normal and breath sounds normal. She has no wheezes.  Lymphadenopathy:    She has no cervical adenopathy.  Neurological: She is alert and oriented to person, place, and time.  Psychiatric: She has a normal mood and affect.          Assessment & Plan:  Bmet RF meds 1 yr

## 2012-09-01 ENCOUNTER — Other Ambulatory Visit: Payer: Self-pay | Admitting: Physician Assistant

## 2012-10-05 ENCOUNTER — Ambulatory Visit (INDEPENDENT_AMBULATORY_CARE_PROVIDER_SITE_OTHER): Payer: BC Managed Care – PPO | Admitting: Physician Assistant

## 2012-10-05 VITALS — BP 120/76 | HR 74 | Temp 97.8°F | Resp 17 | Ht 62.5 in | Wt 176.0 lb

## 2012-10-05 DIAGNOSIS — J019 Acute sinusitis, unspecified: Secondary | ICD-10-CM

## 2012-10-05 MED ORDER — CEFDINIR 300 MG PO CAPS: 600.0000 mg | ORAL_CAPSULE | Freq: Every day | ORAL | Status: DC

## 2012-10-05 MED ORDER — GUAIFENESIN ER 1200 MG PO TB12: 1.0000 | ORAL_TABLET | Freq: Two times a day (BID) | ORAL | Status: DC | PRN

## 2012-10-05 MED ORDER — IPRATROPIUM BROMIDE 0.03 % NA SOLN: 2.0000 | Freq: Two times a day (BID) | NASAL | Status: DC

## 2012-10-05 NOTE — Patient Instructions (Signed)
Get plenty of rest and drink at least 64 ounces of water daily. 

## 2012-10-05 NOTE — Progress Notes (Signed)
Subjective:    Patient ID: Kristina Mann, female    DOB: May 28, 1946, 66 y.o.   MRN: [id number]  HPI This 66 y.o. female presents for evaluation of sinus pressure, intermittent ear pain and fullness and intermittent sore throat for the past week.  Some non-productive cough. This morning, the left eye was "plastered shut."  Eye continued to water this morning for about an hour, clear, no purulence. Using fluids, OTC medications, but without benefit.  Subjective fever/chills ("I generally run such a low temperature...").  No GU/GI symptoms.  Review of Systems As above.  Past Medical History  Diagnosis Date  . Hypertension   . Thyroid dysfunction      NODULE ABN CELLS 2006  . Bronchitis   . Chicken pox   . Sarcoidosis   . Seasonal allergies   . Basal cell cancer 06/2005  . Mass 2009    RETROPERITONEAL/PERINEPHRIC    Past Surgical History  Procedure Date  . Abdominal hysterectomy   . Gallbladder surgery   . Tonsillectomy and adenoidectomy   . Appendectomy   . 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 Wrist surgery     LEFT CYST    Prior to Admission medications   Medication Sig Start Date End Date Taking? Authorizing Provider  ADVAIR DISKUS 250-50 MCG/DOSE AEPB USE ONE INHALATION BY MOUTH TWICE DAILY 04/24/12  Yes Rickard Patience, PA-C  aspirin 81 MG chewable tablet Chew 81 mg by mouth daily.   Yes Historical Provider, MD  calcium carbonate (TUMS EX) 750 MG chewable tablet Chew 1 tablet by mouth daily.   Yes Historical Provider, MD  calcium-vitamin D 250-100 MG-UNIT per tablet Take 1 tablet by mouth 2 (two) times daily.   Yes Historical Provider, MD  chlorthalidone (HYGROTON) 25 MG tablet TAKE 1/2 TABLET BY MOUTH EVERY DAY FOR HYPERTENSION 03/03/12  Yes Morrell Riddle, PA-C  fexofenadine-pseudoephedrine (ALLEGRA-D 24) 180-240 MG per 24 hr tablet Take 1 tablet by mouth daily.   Yes Historical Provider, MD  fluticasone (FLONASE) 50 MCG/ACT nasal spray USE 2 SPRAYS IN EACH       NOSTRIL DAILY 09/01/12  Yes  Marzella Schlein McClung, PA-C  lisinopril (PRINIVIL,ZESTRIL) 5 MG tablet Take 5 mg by mouth daily.   Yes Historical Provider, MD  Multiple Vitamin (MULTIVITAMIN) capsule Take 1 capsule by mouth daily.   Yes Historical Provider, MD  naproxen sodium (ANAPROX) 220 MG tablet Take 220 mg by mouth 2 (two) times daily with a meal.   Yes Historical Provider, MD  albuterol (PROVENTIL HFA;VENTOLIN HFA) 108 (90 BASE) MCG/ACT inhaler Inhale 2 puffs into the lungs every 6 (six) hours as needed for wheezing. 04/05/12 04/05/13  Rickard Patience, PA-C    Allergies  Allergen Reactions  . Codeine Nausea And Vomiting    History   Social History  . Marital Status: Married    Spouse Name: Sherilyn Cooter    Number of Children: 3  . Years of Education: 16   Occupational History  . Customer Service    Social History Main Topics  . Smoking status: Never Smoker   . Smokeless tobacco: Never Used  . Alcohol Use: 0.0 - 0.6 oz/week    0-1 Glasses of wine per week  . Drug Use: No  . Sexually Active: Not Currently   Other Topics Concern  . Not on file   Social History Narrative   O+ BLOOD DONATES Q 3 MONTHS.  EXERCISE-WALKS.    Family History  Problem Relation Age of Onset  .  Diabetes Mother   . Cancer Mother     BREAST, THYROID  . Heart disease Father     MI X3  . Cancer Father   . COPD Father   . Hypertension    . Hypertension Sister   . Nephrolithiasis Sister   . Hypertension Brother        Objective:   Physical Exam  Blood pressure 120/76, pulse 74, temperature 97.8 F (36.6 C), temperature source Oral, resp. rate 17, height 5' 2.5" (1.588 m), weight 176 lb (79.833 kg), SpO2 96.00%. Body mass index is 31.68 kg/(m^2). Well-developed, well nourished WF who is awake, alert and oriented, in NAD. HEENT: [place]/AT, PERRL, EOMI.  Sclera and conjunctiva are clear.  No crusting or drainage noted. EAC are patent, TMs are normal in appearance. Nasal mucosa is pink and moist. OP is clear. Mild maxillary sinus tenderness  bilaterally.  No frontal sinus tenderness. Neck: supple, non-tender, no lymphadenopathy, thyromegaly. Heart: RRR, no murmur Lungs: normal effort, CTA Extremities: no cyanosis, clubbing or edema. Skin: warm and dry without rash. Psychologic: good mood and appropriate affect, normal speech and behavior.     Assessment & Plan:   1. Acute sinusitis, unspecified  cefdinir (OMNICEF) 300 MG capsule, ipratropium (ATROVENT) 0.03 % nasal spray, Guaifenesin (MUCINEX MAXIMUM STRENGTH) 1200 MG TB12   Anticipatory guidance provided.  Supportive care provided.

## 2012-10-24 ENCOUNTER — Other Ambulatory Visit: Payer: Self-pay | Admitting: Physician Assistant

## 2012-10-27 ENCOUNTER — Other Ambulatory Visit: Payer: Self-pay | Admitting: Internal Medicine

## 2012-11-15 ENCOUNTER — Ambulatory Visit (INDEPENDENT_AMBULATORY_CARE_PROVIDER_SITE_OTHER): Payer: BC Managed Care – PPO | Admitting: Family Medicine

## 2012-11-15 VITALS — BP 126/74 | HR 81 | Temp 98.2°F | Resp 17 | Ht 60.0 in | Wt 175.0 lb

## 2012-11-15 DIAGNOSIS — J329 Chronic sinusitis, unspecified: Secondary | ICD-10-CM

## 2012-11-15 MED ORDER — LEVOFLOXACIN 500 MG PO TABS: 500.0000 mg | ORAL_TABLET | Freq: Every day | ORAL | Status: DC

## 2012-11-15 NOTE — Progress Notes (Signed)
@[email or web address] @   Patient ID: Kristina Mann MRN: [id number], DOB: September 07, 1946, 66 y.o. Date of Encounter: 11/15/2012, 6:48 PM  Primary Physician: Tally Due, MD  Chief Complaint:  Chief Complaint  Patient presents with  . Sinusitis    1 week    HPI: 66 y.o. year old female presents with 7 day history of nasal congestion, post nasal drip, sore throat, sinus pressure, and cough. Afebrile. No chills. Nasal congestion thick and green/yellow. Sinus pressure is the worst symptom. Cough is productive secondary to post nasal drip and not associated with time of day. Ears feel full, leading to sensation of muffled hearing. Has tried OTC cold preps without success. No GI complaints.  No recent antibiotics, recent travels, or sick contacts   No leg trauma, sedentary periods, h/o cancer, or tobacco use.  Past Medical History  Diagnosis Date  . Hypertension   . Thyroid dysfunction      NODULE ABN CELLS 2006  . Bronchitis   . Chicken pox   . Sarcoidosis   . Seasonal allergies   . Basal cell cancer 06/2005  . Mass 2009    RETROPERITONEAL/PERINEPHRIC     Home Meds: Prior to Admission medications   Medication Sig Start Date End Date Taking? Authorizing Provider  ADVAIR DISKUS 250-50 MCG/DOSE AEPB INHALE 1 PUFF INTO THE LUNGS TWICE DAILY 10/27/12  Yes Eleanore E Debbra Riding, PA-C  albuterol (PROVENTIL HFA;VENTOLIN HFA) 108 (90 BASE) MCG/ACT inhaler Inhale 2 puffs into the lungs every 6 (six) hours as needed for wheezing. 04/05/12 04/05/13 Yes Rickard Patience, PA-C  aspirin 81 MG chewable tablet Chew 81 mg by mouth daily.   Yes Historical Provider, MD  calcium carbonate (TUMS EX) 750 MG chewable tablet Chew 1 tablet by mouth daily.   Yes Historical Provider, MD  calcium-vitamin D 250-100 MG-UNIT per tablet Take 1 tablet by mouth 2 (two) times daily.   Yes Historical Provider, MD  chlorthalidone (HYGROTON) 25 MG tablet TAKE 1/2 TABLET BY MOUTH EVERY DAY FOR HYPERTENSION 03/03/12  Yes Morrell Riddle,  PA-C  fexofenadine-pseudoephedrine (ALLEGRA-D 24) 180-240 MG per 24 hr tablet Take 1 tablet by mouth daily.   Yes Historical Provider, MD  fluticasone (FLONASE) 50 MCG/ACT nasal spray USE 2 SPRAYS IN EACH       NOSTRIL DAILY 09/01/12  Yes Marzella Schlein McClung, PA-C  ipratropium (ATROVENT) 0.03 % nasal spray Place 2 sprays into the nose 2 (two) times daily. 10/05/12  Yes Chelle S Jeffery, PA-C  lisinopril (PRINIVIL,ZESTRIL) 5 MG tablet TAKE 1 TABLET BY MOUTH EVERY DAY 10/24/12  Yes Nelva Nay, PA-C  Multiple Vitamin (MULTIVITAMIN) capsule Take 1 capsule by mouth daily.   Yes Historical Provider, MD  naproxen sodium (ANAPROX) 220 MG tablet Take 220 mg by mouth 2 (two) times daily with a meal.   Yes Historical Provider, MD  cefdinir (OMNICEF) 300 MG capsule Take 2 capsules (600 mg total) by mouth daily. 10/05/12   Chelle S Jeffery, PA-C  Guaifenesin (MUCINEX MAXIMUM STRENGTH) 1200 MG TB12 Take 1 tablet (1,200 mg total) by mouth every 12 (twelve) hours as needed. 10/05/12   Chelle Tessa Lerner, PA-C    Allergies:  Allergies  Allergen Reactions  . Codeine Nausea And Vomiting    History   Social History  . Marital Status: Married    Spouse Name: Sherilyn Cooter    Number of Children: 3  . Years of Education: 16   Occupational History  . Customer Service    Social History Main  Topics  . Smoking status: Never Smoker   . Smokeless tobacco: Never Used  . Alcohol Use: 0.0 - 0.6 oz/week    0-1 Glasses of wine per week  . Drug Use: No  . Sexually Active: Not Currently   Other Topics Concern  . Not on file   Social History Narrative   O+ BLOOD DONATES Q 3 MONTHS.  EXERCISE-WALKS.     Review of Systems: Constitutional: negative for chills, fever, night sweats or weight changes Cardiovascular: negative for chest pain or palpitations Respiratory: negative for hemoptysis, wheezing, or shortness of breath Abdominal: negative for abdominal pain, nausea, vomiting or diarrhea Dermatological: negative for  rash Neurologic: negative for headache   Physical Exam: Blood pressure 126/74, pulse 81, temperature 98.2 F (36.8 C), temperature source Oral, resp. rate 17, height 5' (1.524 m), weight 175 lb (79.379 kg), SpO2 96.00%., Body mass index is 34.18 kg/(m^2). General: Well developed, well nourished, in no acute distress. Head: Normocephalic, atraumatic, eyes without discharge, sclera non-icteric, nares are congested. Bilateral auditory canals clear, TM's are without perforation, pearly grey with reflective cone of light bilaterally. Serous effusion bilaterally behind TM's. Maxillary sinus TTP. Oral cavity moist, dentition normal. Posterior pharynx with post nasal drip and mild erythema. No peritonsillar abscess or tonsillar exudate. Neck: Supple. No thyromegaly. Full ROM. No lymphadenopathy. Lungs: Clear bilaterally to auscultation without wheezes, rales, or rhonchi. Breathing is unlabored.  Heart: RRR with S1 S2. No murmurs, rubs, or gallops appreciated. Msk:  Strength and tone normal for age. Extremities: No clubbing or cyanosis. No edema. Neuro: Alert and oriented X 3. Moves all extremities spontaneously. CNII-XII grossly in tact. Psych:  Responds to questions appropriately with a normal affect.   Labs:   ASSESSMENT AND PLAN:  66 y.o. year old female with sinusitis 1. Sinusitis  levofloxacin (LEVAQUIN) 500 MG tablet    -  -Tylenol/Motrin prn -Rest/fluids -RTC precautions -RTC 3-5 days if no improvement  Signed, Elvina Sidle, MD 11/15/2012 6:48 PM

## 2012-11-15 NOTE — Patient Instructions (Addendum)

## 2012-11-16 ENCOUNTER — Other Ambulatory Visit: Payer: Self-pay | Admitting: *Deleted

## 2013-02-19 ENCOUNTER — Ambulatory Visit (INDEPENDENT_AMBULATORY_CARE_PROVIDER_SITE_OTHER): Payer: BC Managed Care – PPO | Admitting: Internal Medicine

## 2013-02-19 ENCOUNTER — Ambulatory Visit: Payer: BC Managed Care – PPO

## 2013-02-19 ENCOUNTER — Encounter: Payer: Self-pay | Admitting: Internal Medicine

## 2013-02-19 VITALS — BP 115/67 | HR 74 | Temp 98.6°F | Resp 16 | Ht 60.5 in | Wt 172.0 lb

## 2013-02-19 DIAGNOSIS — E041 Nontoxic single thyroid nodule: Secondary | ICD-10-CM

## 2013-02-19 DIAGNOSIS — Z23 Encounter for immunization: Secondary | ICD-10-CM

## 2013-02-19 DIAGNOSIS — D869 Sarcoidosis, unspecified: Secondary | ICD-10-CM

## 2013-02-19 DIAGNOSIS — Z0000 Encounter for general adult medical examination without abnormal findings: Secondary | ICD-10-CM

## 2013-02-19 DIAGNOSIS — Z79899 Other long term (current) drug therapy: Secondary | ICD-10-CM

## 2013-02-19 DIAGNOSIS — I10 Essential (primary) hypertension: Secondary | ICD-10-CM

## 2013-02-19 LAB — CBC WITH DIFFERENTIAL/PLATELET
Basophils Absolute: 0.1 10*3/uL (ref 0.0–0.1)
Basophils Relative: 1 % (ref 0–1)
Eosinophils Absolute: 0.2 10*3/uL (ref 0.0–0.7)
Eosinophils Relative: 2 % (ref 0–5)
HCT: 40.6 % (ref 36.0–46.0)
Hemoglobin: 14.3 g/dL (ref 12.0–15.0)
Lymphocytes Relative: 26 % (ref 12–46)
Lymphs Abs: 1.9 10*3/uL (ref 0.7–4.0)
MCH: 29.2 pg (ref 26.0–34.0)
MCHC: 35.2 g/dL (ref 30.0–36.0)
MCV: 83 fL (ref 78.0–100.0)
Monocytes Absolute: 0.5 10*3/uL (ref 0.1–1.0)
Monocytes Relative: 7 % (ref 3–12)
Neutro Abs: 4.6 10*3/uL (ref 1.7–7.7)
Neutrophils Relative %: 64 % (ref 43–77)
Platelets: 400 10*3/uL (ref 150–400)
RBC: 4.89 MIL/uL (ref 3.87–5.11)
RDW: 15.1 % (ref 11.5–15.5)
WBC: 7.2 10*3/uL (ref 4.0–10.5)

## 2013-02-19 LAB — COMPREHENSIVE METABOLIC PANEL
ALT: 21 U/L (ref 0–35)
AST: 21 U/L (ref 0–37)
Albumin: 4.4 g/dL (ref 3.5–5.2)
Alkaline Phosphatase: 60 U/L (ref 39–117)
BUN: 18 mg/dL (ref 6–23)
CO2: 24 mEq/L (ref 19–32)
Calcium: 9.4 mg/dL (ref 8.4–10.5)
Chloride: 107 mEq/L (ref 96–112)
Creat: 0.84 mg/dL (ref 0.50–1.10)
Glucose, Bld: 96 mg/dL (ref 70–99)
Potassium: 4.2 mEq/L (ref 3.5–5.3)
Sodium: 140 mEq/L (ref 135–145)
Total Bilirubin: 0.3 mg/dL (ref 0.3–1.2)
Total Protein: 6.6 g/dL (ref 6.0–8.3)

## 2013-02-19 LAB — POCT URINALYSIS DIPSTICK
Bilirubin, UA: NEGATIVE
Blood, UA: NEGATIVE
Glucose, UA: NEGATIVE
Ketones, UA: NEGATIVE
Leukocytes, UA: NEGATIVE
Nitrite, UA: NEGATIVE
Protein, UA: NEGATIVE
Spec Grav, UA: 1.025
Urobilinogen, UA: 0.2
pH, UA: 5.5

## 2013-02-19 LAB — LIPID PANEL
Cholesterol: 173 mg/dL (ref 0–200)
HDL: 72 mg/dL (ref >39)
LDL Cholesterol: 87 mg/dL (ref 0–99)
Total CHOL/HDL Ratio: 2.4 Ratio
Triglycerides: 68 mg/dL (ref <150)
VLDL: 14 mg/dL (ref 0–40)

## 2013-02-19 LAB — POCT UA - MICROSCOPIC ONLY
Bacteria, U Microscopic: NEGATIVE
Casts, Ur, LPF, POC: NEGATIVE
Crystals, Ur, HPF, POC: NEGATIVE
Yeast, UA: NEGATIVE

## 2013-02-19 LAB — TSH: TSH: 0.369 u[IU]/mL (ref 0.350–4.500)

## 2013-02-19 LAB — IFOBT (OCCULT BLOOD): IFOBT: NEGATIVE

## 2013-02-19 MED ORDER — LISINOPRIL 5 MG PO TABS: 5.0000 mg | ORAL_TABLET | Freq: Every day | ORAL | Status: DC

## 2013-02-19 NOTE — Patient Instructions (Addendum)
`Sarcoidosis, Schaumann's Disease, Sarcoid of Boeck Sarcoidosis appears briefly and heals naturally in 60 to 70 percent of cases, often without the patient knowing or doing anything about it. 20 to 30 percent of patients with sarcoidosis are left with some permanent lung damage. In 10 to 15 percent of the patients, sarcoidosis can become chronic (long lasting). When either the granulomas or fibrosis seriously affect the function of a vital organ (lungs, heart, nervous system, liver, or kidneys), sarcoidosis can be fatal. This occurs 5 to 10 percent of the time. No one can predict how sarcoidosis will progress in an individual patient. The symptoms the patient experiences, the caregiver's findings, and the patient's race can give some clues. Sarcoidosis was once considered a rare disease. We now know that it is a common chronic illness that appears all over the world. It is the most common of the fibrotic (scarring) lung disorders. Anyone can get sarcoidosis. It occurs in all races and in both sexes. The risk is greater if you are a young black adult, especially a black woman, or are of Scandinavian, [place], [place], or [place] origin. In sarcoidosis, small lumps (also called nodules or granulomas) develop in multiple organs of the body. These granulomas are small collections of inflamed cells. They commonly appear in the lungs. This is the most common organ affected. They also occur in the lymph nodes (your glands), skin, liver, and eyes. The granulomas vary in the amount of disease they produce from very little with no problems (symptoms) to causing severe illness. The cause of sarcoidosis is not known. It may be due to an abnormal immune reaction in the body. Most people will recover. A few people will develop long lasting conditions that may get worse. Women are affected more often than men. The majority of those affected are under forty years of age. Because we do not know the cause, we do not have ways to  prevent it. SYMPTOMS   Fever.  Loss of appetite.  Night sweats.  Joint pain.  Aching muscles Symptoms vary because the disease affects different parts of the body in different people. Most people who see their caregiver with sarcoidosis have lung problems. The first signs are usually a dry cough and shortness of breath. There may also be wheezing, chest pain, or a cough that brings up bloody mucus. In severe cases, lung function may become so poor that the person cannot perform even the simple routine tasks of daily life. Other symptoms of sarcoidosis are less common than lung symptoms. They can include:  Skin symptoms. Sarcoidosis can appear as a collection of tender, red bumps called erythema nodosum. These bumps usually occur on the face, shins, and arms. They can also occur as a scaly, purplish discoloration on the nose, cheeks, and ears. This is called lupus pernio. Less often, sarcoidosis causes cysts, pimples, or disfiguring over growths of skin. In many cases, the disfiguring over growths develop in areas of scars or tattoos.  Eye symptoms. These include redness, eye pain, and sensitivity to light.  Heart symptoms. These include irregular heartbeat and heart failure.  Other symptoms. A person may have paralyzed facial muscles, seizures, psychiatric symptoms, swollen salivary glands, or bone pain. DIAGNOSIS  Even when there are no symptoms, your caregiver can sometimes pick up signs of sarcoidosis during a routine examination, usually through a chest x-ray or when checking other complaints. The patient's age and race or ethnic group can raise an additional red flag that a sign or symptom could   be related to sarcoidosis.   Enlargement of the salivary or tear glands and cysts in bone tissue may also be caused by sarcoidosis.  You may have had a biopsy done that shows signs of sarcoidosis. A biopsy is a small tissue sample that is removed for laboratory testing. This tissue sample can  be taken from your lung, skin, lip, or another inflamed or abnormal area of the body.  You may have had an abnormal chest X-ray. Although you appear healthy, a chest X-ray ordered for other reasons may turn up abnormalities that suggest sarcoidosis.  Other tests may be needed. These tests may be done to rule out other illnesses or to determine the amount of organ damage caused by sarcoidosis. Some of the most common tests are:  Blood levels of calcium or angiotensin-converting enzyme may be high in people with sarcoidosis.  Blood tests to evaluate how well your liver is functioning.  Lung function tests to measure how well you are breathing.  A complete eye examination. TREATMENT  If sarcoidosis does not cause any problems, treatment may not be necessary. Your caregiver may decide to simply monitor your condition. As part of this monitoring process, you may have frequent office visits, follow-up chest X-rays, and tests of your lung function.If you have signs of moderate or severe lung disease, your doctor may recommend:  A corticosteroid drug, such as prednisone (sold under several brand names).  Corticosteroids also are used to treat sarcoidosis of the eyes, joints, skin, nerves, or heart.  Corticosteroid eye drops may be used for the eyes.  Over-the-counter medications like nonsteroidal anti-inflammatory drugs (NSAID) often are used to treat joint pain first before corticosteroids, which tend to have more side effects.  If corticosteroids are not effective or cause serious side effects, other drugs that alter or suppress the immune system may be used.  In rare cases, when sarcoidosis causes life-threatening lung disease, a lung transplant may be necessary. However, there is some risk that the new lungs also will be attacked by sarcoidosis. SEEK IMMEDIATE MEDICAL CARE IF:   You suffer from shortness of breath or a lingering cough.  You develop new problems that may be related to the  disease. Remember this disease can affect almost all organs of the body and cause many different problems. Document Released: 10/13/2004 Document Revised: 03/06/2012 Document Reviewed: 03/23/2006 [employer] Patient Information 2013 [employer].  

## 2013-02-19 NOTE — Progress Notes (Signed)
Subjective:    Patient ID: Kristina Mann, female    DOB: 06/09/1946, 67 y.o.   MRN: [id number]  HPI Doing well. No pain. Sarcoidosis, thyroid nodules, mild htn, allergys all stable and controlled.   Review of Systems  Constitutional: Negative.   HENT: Negative.   Eyes: Negative.   Respiratory: Positive for wheezing.   Cardiovascular: Negative.   Gastrointestinal: Negative.   Endocrine: Negative.   Genitourinary: Negative.   Musculoskeletal: Negative.   Allergic/Immunologic: Positive for environmental allergies.  Neurological: Negative.   Hematological: Negative.   Psychiatric/Behavioral: Negative.        Objective:   Physical Exam  Vitals reviewed. Constitutional: She is oriented to person, place, and time. She appears well-developed and well-nourished.  HENT:  Right Ear: External ear normal.  Left Ear: External ear normal.  Nose: Nose normal.  Mouth/Throat: Oropharynx is clear and moist.  Eyes: Conjunctivae and EOM are normal. Pupils are equal, round, and reactive to light. No scleral icterus.  Neck: Normal range of motion. Neck supple. No tracheal deviation present. Thyromegaly present.  Cardiovascular: Normal rate, regular rhythm, normal heart sounds and intact distal pulses.   Pulmonary/Chest: Effort normal and breath sounds normal.  Abdominal: Soft. Bowel sounds are normal. She exhibits no mass. There is no tenderness. Hernia confirmed negative in the right inguinal area and confirmed negative in the left inguinal area.  Genitourinary: Vagina normal. No breast swelling, tenderness, discharge or bleeding. Pelvic exam was performed with patient in the knee-chest position. There is no rash, tenderness, lesion or injury on the right labia. There is no rash, tenderness or lesion on the left labia. No vaginal discharge found.  Rectal exam normal  Musculoskeletal: Normal range of motion.  Lymphadenopathy:    She has no cervical adenopathy.       Right: No inguinal  adenopathy present.       Left: No inguinal adenopathy present.  Neurological: She is alert and oriented to person, place, and time. She has normal reflexes. No cranial nerve deficit. Coordination normal.  Skin: Skin is warm and dry.  Psychiatric: She has a normal mood and affect.   Paps done with hpv   [facility] reading (PRIMARY) by  Dr.Guest CXR possible hilar adenopathy, has hx of sarcoid  Results for orders placed in visit on 02/19/13  POCT UA - MICROSCOPIC ONLY      Result Value Range   WBC, Ur, HPF, POC 0-6     RBC, urine, microscopic 0-1     Bacteria, U Microscopic neg     Mucus, UA small     Epithelial cells, urine per micros 0-10     Crystals, Ur, HPF, POC neg     Casts, Ur, LPF, POC neg     Yeast, UA neg    POCT URINALYSIS DIPSTICK      Result Value Range   Color, UA yellow     Clarity, UA clear     Glucose, UA neg     Bilirubin, UA neg     Ketones, UA neg     Spec Grav, UA 1.025     Blood, UA neg     pH, UA 5.5     Protein, UA neg     Urobilinogen, UA 0.2     Nitrite, UA neg     Leukocytes, UA Negative    IFOBT (OCCULT BLOOD)      Result Value Range   IFOBT Negative         Assessment &  Plan:  Normal exam RF meds 1 year

## 2013-02-20 LAB — PAP IG W/ RFLX HPV ASCU

## 2013-02-24 ENCOUNTER — Other Ambulatory Visit: Payer: Self-pay | Admitting: Internal Medicine

## 2013-03-13 ENCOUNTER — Encounter: Payer: Self-pay | Admitting: Internal Medicine

## 2013-03-14 ENCOUNTER — Other Ambulatory Visit: Payer: Self-pay | Admitting: Physician Assistant

## 2013-03-30 ENCOUNTER — Encounter: Payer: Self-pay | Admitting: Internal Medicine

## 2013-08-13 ENCOUNTER — Encounter: Payer: Self-pay | Admitting: Internal Medicine

## 2013-08-13 ENCOUNTER — Ambulatory Visit (INDEPENDENT_AMBULATORY_CARE_PROVIDER_SITE_OTHER): Payer: BC Managed Care – PPO | Admitting: Internal Medicine

## 2013-08-13 VITALS — BP 142/78 | HR 78 | Temp 98.3°F | Resp 16 | Ht 60.0 in | Wt 173.4 lb

## 2013-08-13 DIAGNOSIS — I10 Essential (primary) hypertension: Secondary | ICD-10-CM

## 2013-08-13 DIAGNOSIS — E041 Nontoxic single thyroid nodule: Secondary | ICD-10-CM

## 2013-08-13 DIAGNOSIS — D869 Sarcoidosis, unspecified: Secondary | ICD-10-CM

## 2013-08-13 DIAGNOSIS — R05 Cough: Secondary | ICD-10-CM

## 2013-08-13 DIAGNOSIS — Z79899 Other long term (current) drug therapy: Secondary | ICD-10-CM

## 2013-08-13 DIAGNOSIS — J019 Acute sinusitis, unspecified: Secondary | ICD-10-CM

## 2013-08-13 DIAGNOSIS — R059 Cough, unspecified: Secondary | ICD-10-CM

## 2013-08-13 DIAGNOSIS — M25562 Pain in left knee: Secondary | ICD-10-CM

## 2013-08-13 LAB — BASIC METABOLIC PANEL
BUN: 14 mg/dL (ref 6–23)
CO2: 24 mEq/L (ref 19–32)
Calcium: 9.7 mg/dL (ref 8.4–10.5)
Chloride: 106 mEq/L (ref 96–112)
Creat: 0.82 mg/dL (ref 0.50–1.10)
Glucose, Bld: 107 mg/dL — ABNORMAL HIGH (ref 70–99)
Potassium: 4 mEq/L (ref 3.5–5.3)
Sodium: 139 mEq/L (ref 135–145)

## 2013-08-13 MED ORDER — AZITHROMYCIN 500 MG PO TABS: 500.0000 mg | ORAL_TABLET | Freq: Every day | ORAL | Status: DC

## 2013-08-13 NOTE — Patient Instructions (Addendum)
Sinusitis Sinusitis is redness, soreness, and swelling (inflammation) of the paranasal sinuses. Paranasal sinuses are air pockets within the bones of your face (beneath the eyes, the middle of the forehead, or above the eyes). In healthy paranasal sinuses, mucus is able to drain out, and air is able to circulate through them by way of your nose. However, when your paranasal sinuses are inflamed, mucus and air can become trapped. This can allow bacteria and other germs to grow and cause infection. Sinusitis can develop quickly and last only a short time (acute) or continue over a long period (chronic). Sinusitis that lasts for more than 12 weeks is considered chronic.  CAUSES  Causes of sinusitis include:  Allergies.  Structural abnormalities, such as displacement of the cartilage that separates your nostrils (deviated septum), which can decrease the air flow through your nose and sinuses and affect sinus drainage.  Functional abnormalities, such as when the small hairs (cilia) that line your sinuses and help remove mucus do not work properly or are not present. SYMPTOMS  Symptoms of acute and chronic sinusitis are the same. The primary symptoms are pain and pressure around the affected sinuses. Other symptoms include:  Upper toothache.  Earache.  Headache.  Bad breath.  Decreased sense of smell and taste.  A cough, which worsens when you are lying flat.  Fatigue.  Fever.  Thick drainage from your nose, which often is green and may contain pus (purulent).  Swelling and warmth over the affected sinuses. DIAGNOSIS  Your caregiver will perform a physical exam. During the exam, your caregiver may:  Look in your nose for signs of abnormal growths in your nostrils (nasal polyps).  Tap over the affected sinus to check for signs of infection.  View the inside of your sinuses (endoscopy) with a special imaging device with a light attached (endoscope), which is inserted into your  sinuses. If your caregiver suspects that you have chronic sinusitis, one or more of the following tests may be recommended:  Allergy tests.  Nasal culture A sample of mucus is taken from your nose and sent to a lab and screened for bacteria.  Nasal cytology A sample of mucus is taken from your nose and examined by your caregiver to determine if your sinusitis is related to an allergy. TREATMENT  Most cases of acute sinusitis are related to a viral infection and will resolve on their own within 10 days. Sometimes medicines are prescribed to help relieve symptoms (pain medicine, decongestants, nasal steroid sprays, or saline sprays).  However, for sinusitis related to a bacterial infection, your caregiver will prescribe antibiotic medicines. These are medicines that will help kill the bacteria causing the infection.  Rarely, sinusitis is caused by a fungal infection. In theses cases, your caregiver will prescribe antifungal medicine. For some cases of chronic sinusitis, surgery is needed. Generally, these are cases in which sinusitis recurs more than 3 times per year, despite other treatments. HOME CARE INSTRUCTIONS   Drink plenty of water. Water helps thin the mucus so your sinuses can drain more easily.  Use a humidifier.  Inhale steam 3 to 4 times a day (for example, sit in the bathroom with the shower running).  Apply a warm, moist washcloth to your face 3 to 4 times a day, or as directed by your caregiver.  Use saline nasal sprays to help moisten and clean your sinuses.  Take over-the-counter or prescription medicines for pain, discomfort, or fever only as directed by your caregiver. SEEK IMMEDIATE MEDICAL   CARE IF:  You have increasing pain or severe headaches.  You have nausea, vomiting, or drowsiness.  You have swelling around your face.  You have vision problems.  You have a stiff neck.  You have difficulty breathing. MAKE SURE YOU:   Understand these  instructions.  Will watch your condition.  Will get help right away if you are not doing well or get worse. Document Released: 12/13/2005 Document Revised: 03/06/2012 Document Reviewed: 12/28/2011 [facility] Patient Information 2014 [place], [place]. Acute Bronchitis You have acute bronchitis. This means you have a chest cold. The airways in your lungs are red and sore (inflamed). Acute means it is sudden onset.  CAUSES Bronchitis is most often caused by the same virus that causes a cold. SYMPTOMS   Body aches.  Chest congestion.  Chills.  Cough.  Fever.  Shortness of breath.  Sore throat. TREATMENT  Acute bronchitis is usually treated with rest, fluids, and medicines for relief of fever or cough. Most symptoms should go away after a few days or a week. Increased fluids may help thin your secretions and will prevent dehydration. Your caregiver may give you an inhaler to improve your symptoms. The inhaler reduces shortness of breath and helps control cough. You can take over-the-counter pain relievers or cough medicine to decrease coughing, pain, or fever. A cool-air vaporizer may help thin bronchial secretions and make it easier to clear your chest. Antibiotics are usually not needed but can be prescribed if you smoke, are seriously ill, have chronic lung problems, are elderly, or you are at higher risk for developing complications.Allergies and asthma can make bronchitis worse. Repeated episodes of bronchitis may cause longstanding lung problems. Avoid smoking and secondhand smoke.Exposure to cigarette smoke or irritating chemicals will make bronchitis worse. If you are a cigarette smoker, consider using nicotine gum or skin patches to help control withdrawal symptoms. Quitting smoking will help your lungs heal faster. Recovery from bronchitis is often slow, but you should start feeling better after 2 to 3 days. Cough from bronchitis frequently lasts for 3 to 4 weeks. To prevent  another bout of acute bronchitis:  Quit smoking.  Wash your hands frequently to get rid of viruses or use a hand sanitizer.  Avoid other people with cold or virus symptoms.  Try not to touch your hands to your mouth, nose, or eyes. SEEK IMMEDIATE MEDICAL CARE IF:  You develop increased fever, chills, or chest pain.  You have severe shortness of breath or bloody sputum.  You develop dehydration, fainting, repeated vomiting, or a severe headache.  You have no improvement after 1 week of treatment or you get worse. MAKE SURE YOU:   Understand these instructions.  Will watch your condition.  Will get help right away if you are not doing well or get worse. Document Released: 01/20/2005 Document Revised: 03/06/2012 Document Reviewed: 04/07/2011 [facility] Patient Information 2014 [place], [place]. Sarcoidosis, Schaumann's Disease, Sarcoid of Boeck Sarcoidosis appears briefly and heals naturally in 60 to 70 percent of cases, often without the patient knowing or doing anything about it. 20 to 30 percent of patients with sarcoidosis are left with some permanent lung damage. In 10 to 15 percent of the patients, sarcoidosis can become chronic (long lasting). When either the granulomas or fibrosis seriously affect the function of a vital organ (lungs, heart, nervous system, liver, or kidneys), sarcoidosis can be fatal. This occurs 5 to 10 percent of the time. No one can predict how sarcoidosis will progress in an individual patient. The  symptoms the patient experiences, the caregiver's findings, and the patient's race can give some clues. Sarcoidosis was once considered a rare disease. We now know that it is a common chronic illness that appears all over the world. It is the most common of the fibrotic (scarring) lung disorders. Anyone can get sarcoidosis. It occurs in all races and in both sexes. The risk is greater if you are a young black adult, especially a black woman, or are of [place],  [place], [place], or [place] origin. In sarcoidosis, small lumps (also called nodules or granulomas) develop in multiple organs of the body. These granulomas are small collections of inflamed cells. They commonly appear in the lungs. This is the most common organ affected. They also occur in the lymph nodes (your glands), skin, liver, and eyes. The granulomas vary in the amount of disease they produce from very little with no problems (symptoms) to causing severe illness. The cause of sarcoidosis is not known. It may be due to an abnormal immune reaction in the body. Most people will recover. A few people will develop long lasting conditions that may get worse. Women are affected more often than men. The majority of those affected are under forty years of age. Because we do not know the cause, we do not have ways to prevent it. SYMPTOMS   Fever.  Loss of appetite.  Night sweats.  Joint pain.  Aching muscles Symptoms vary because the disease affects different parts of the body in different people. Most people who see their caregiver with sarcoidosis have lung problems. The first signs are usually a dry cough and shortness of breath. There may also be wheezing, chest pain, or a cough that brings up bloody mucus. In severe cases, lung function may become so poor that the person cannot perform even the simple routine tasks of daily life. Other symptoms of sarcoidosis are less common than lung symptoms. They can include:  Skin symptoms. Sarcoidosis can appear as a collection of tender, red bumps called erythema nodosum. These bumps usually occur on the face, shins, and arms. They can also occur as a scaly, purplish discoloration on the nose, cheeks, and ears. This is called lupus pernio. Less often, sarcoidosis causes cysts, pimples, or disfiguring over growths of skin. In many cases, the disfiguring over growths develop in areas of scars or tattoos.  Eye symptoms. These include redness, eye pain, and  sensitivity to light.  Heart symptoms. These include irregular heartbeat and heart failure.  Other symptoms. A person may have paralyzed facial muscles, seizures, psychiatric symptoms, swollen salivary glands, or bone pain. DIAGNOSIS  Even when there are no symptoms, your caregiver can sometimes pick up signs of sarcoidosis during a routine examination, usually through a chest x-ray or when checking other complaints. The patient's age and race or ethnic group can raise an additional red flag that a sign or symptom could be related to sarcoidosis.   Enlargement of the salivary or tear glands and cysts in bone tissue may also be caused by sarcoidosis.  You may have had a biopsy done that shows signs of sarcoidosis. A biopsy is a small tissue sample that is removed for laboratory testing. This tissue sample can be taken from your lung, skin, lip, or another inflamed or abnormal area of the body.  You may have had an abnormal chest X-ray. Although you appear healthy, a chest X-ray ordered for other reasons may turn up abnormalities that suggest sarcoidosis.  Other tests may be needed. These  tests may be done to rule out other illnesses or to determine the amount of organ damage caused by sarcoidosis. Some of the most common tests are:  Blood levels of calcium or angiotensin-converting enzyme may be high in people with sarcoidosis.  Blood tests to evaluate how well your liver is functioning.  Lung function tests to measure how well you are breathing.  A complete eye examination. TREATMENT  If sarcoidosis does not cause any problems, treatment may not be necessary. Your caregiver may decide to simply monitor your condition. As part of this monitoring process, you may have frequent office visits, follow-up chest X-rays, and tests of your lung function.If you have signs of moderate or severe lung disease, your doctor may recommend:  A corticosteroid drug, such as prednisone (sold under several  brand names).  Corticosteroids also are used to treat sarcoidosis of the eyes, joints, skin, nerves, or heart.  Corticosteroid eye drops may be used for the eyes.  Over-the-counter medications like nonsteroidal anti-inflammatory drugs (NSAID) often are used to treat joint pain first before corticosteroids, which tend to have more side effects.  If corticosteroids are not effective or cause serious side effects, other drugs that alter or suppress the immune system may be used.  In rare cases, when sarcoidosis causes life-threatening lung disease, a lung transplant may be necessary. However, there is some risk that the new lungs also will be attacked by sarcoidosis. SEEK IMMEDIATE MEDICAL CARE IF:   You suffer from shortness of breath or a lingering cough.  You develop new problems that may be related to the disease. Remember this disease can affect almost all organs of the body and cause many different problems. Document Released: 10/13/2004 Document Revised: 03/06/2012 Document Reviewed: 03/23/2006 [facility] Patient Information 2014 [place], [place].
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
## 2013-08-23 ENCOUNTER — Ambulatory Visit: Payer: BC Managed Care – PPO

## 2013-08-23 ENCOUNTER — Ambulatory Visit (INDEPENDENT_AMBULATORY_CARE_PROVIDER_SITE_OTHER): Payer: BC Managed Care – PPO | Admitting: Internal Medicine

## 2013-08-23 VITALS — BP 128/72 | HR 84 | Temp 98.6°F | Resp 20 | Ht 60.5 in | Wt 173.4 lb

## 2013-08-23 DIAGNOSIS — R059 Cough, unspecified: Secondary | ICD-10-CM

## 2013-08-23 DIAGNOSIS — R05 Cough: Secondary | ICD-10-CM

## 2013-08-23 DIAGNOSIS — D869 Sarcoidosis, unspecified: Secondary | ICD-10-CM

## 2013-08-23 DIAGNOSIS — R51 Headache: Secondary | ICD-10-CM

## 2013-08-23 DIAGNOSIS — J329 Chronic sinusitis, unspecified: Secondary | ICD-10-CM

## 2013-08-23 LAB — POCT CBC
Granulocyte percent: 60.7 %G (ref 37–80)
HCT, POC: 44.9 % (ref 37.7–47.9)
Hemoglobin: 14.5 g/dL (ref 12.2–16.2)
Lymph, poc: 2.3 (ref 0.6–3.4)
MCH, POC: 28.4 pg (ref 27–31.2)
MCHC: 32.3 g/dL (ref 31.8–35.4)
MCV: 88 fL (ref 80–97)
MID (cbc): 0.6 (ref 0–0.9)
MPV: 8.7 fL (ref 0–99.8)
POC Granulocyte: 4.6 (ref 2–6.9)
POC LYMPH PERCENT: 30.8 %L (ref 10–50)
POC MID %: 8.5 %M (ref 0–12)
Platelet Count, POC: 396 10*3/uL (ref 142–424)
RBC: 5.1 M/uL (ref 4.04–5.48)
RDW, POC: 17.6 %
WBC: 7.6 10*3/uL (ref 4.6–10.2)

## 2013-08-23 LAB — ANGIOTENSIN CONVERTING ENZYME: Angiotensin-Converting Enzyme: 13 U/L (ref 8–52)

## 2013-08-23 MED ORDER — PREDNISONE 10 MG PO TABS: ORAL_TABLET | ORAL | Status: DC

## 2013-08-23 MED ORDER — CEFTRIAXONE SODIUM 1 G IJ SOLR
1.0000 g | Freq: Once | INTRAMUSCULAR | Status: AC
  Administered 2013-08-23: 1 g via INTRAMUSCULAR

## 2013-08-23 MED ORDER — LEVOFLOXACIN 500 MG PO TABS: 500.0000 mg | ORAL_TABLET | Freq: Every day | ORAL | Status: DC

## 2013-08-23 NOTE — Progress Notes (Signed)
  Subjective:    Patient ID: Kristina Mann, female    DOB: [birth year], 67 y.o.   MRN: [id number]  HPI Has sarcoidosis, sinus infection is worse see last visit, chest is ok, cough is mild. No sob, cp. No fever.   Review of Systems     Objective:   Physical Exam  Vitals reviewed. Constitutional: She is oriented to person, place, and time. She appears well-developed and well-nourished. She appears ill.  HENT:  Right Ear: External ear normal.  Left Ear: External ear normal.  Nose: Mucosal edema, rhinorrhea and sinus tenderness present. Right sinus exhibits maxillary sinus tenderness. Right sinus exhibits no frontal sinus tenderness. Left sinus exhibits maxillary sinus tenderness. Left sinus exhibits no frontal sinus tenderness.  Mouth/Throat: Oropharynx is clear and moist.  Eyes: EOM are normal.  Neck: Neck supple.  Cardiovascular: Normal rate, regular rhythm and normal heart sounds.   Pulmonary/Chest: Effort normal and breath sounds normal. She has no wheezes. She has no rales. She exhibits no tenderness.  Musculoskeletal: Normal range of motion.  Neurological: She is alert and oriented to person, place, and time. No cranial nerve deficit. She exhibits normal muscle tone. Coordination normal.  Psychiatric: She has a normal mood and affect. Her behavior is normal.     [facility] reading (PRIMARY) by  Dr.Tyshon Fanning sinus cloudy, no air fluid levels seen, cxr calcified granuloma Results for orders placed in visit on 08/23/13  POCT CBC      Result Value Range   WBC 7.6  4.6 - 10.2 K/uL   Lymph, poc 2.3  0.6 - 3.4   POC LYMPH PERCENT 30.8  10 - 50 %L   MID (cbc) 0.6  0 - 0.9   POC MID % 8.5  0 - 12 %M   POC Granulocyte 4.6  2 - 6.9   Granulocyte percent 60.7  37 - 80 %G   RBC 5.10  4.04 - 5.48 M/uL   Hemoglobin 14.5  12.2 - 16.2 g/dL   HCT, POC 40.9  81.1 - 47.9 %   MCV 88.0  80 - 97 fL   MCH, POC 28.4  27 - 31.2 pg   MCHC 32.3  31.8 - 35.4 g/dL   RDW, POC 91.4     Platelet Count, POC  396  142 - 424 K/uL   MPV 8.7  0 - 99.8 fL          Assessment & Plan:  Sinusitis/Cough Prednisone taper/Levaquin 500mg /Rocephin 1g Steam/afrin 3d

## 2013-08-23 NOTE — Patient Instructions (Signed)
Sarcoidosis, Schaumann's Disease, Sarcoid of [place]  Sarcoidosis appears briefly and heals naturally in 60 to 70 percent of cases, often without the patient knowing or doing anything about it. 20 to 30 percent of patients with sarcoidosis are left with some permanent lung damage. In 10 to 15 percent of the patients, sarcoidosis can become chronic (long lasting). When either the granulomas or fibrosis seriously affect the function of a vital organ (lungs, heart, nervous system, liver, or kidneys), sarcoidosis can be fatal. This occurs 5 to 10 percent of the time. No one can predict how sarcoidosis will progress in an individual patient. The symptoms the patient experiences, the caregiver's findings, and the patient's race can give some clues.  Sarcoidosis was once considered a rare disease. We now know that it is a common chronic illness that appears all over the world. It is the most common of the fibrotic (scarring) lung disorders. Anyone can get sarcoidosis. It occurs in all races and in both sexes. The risk is greater if you are a young black adult, especially a black woman, or are of Scandinavian, German, Irish, or [place] origin.  In sarcoidosis, small lumps (also called nodules or granulomas) develop in multiple organs of the body. These granulomas are small collections of inflamed cells. They commonly appear in the lungs. This is the most common organ affected. They also occur in the lymph nodes (your glands), skin, liver, and eyes. The granulomas vary in the amount of disease they produce from very little with no problems (symptoms) to causing severe illness. The cause of sarcoidosis is not known. It may be due to an abnormal immune reaction in the body. Most people will recover. A few people will develop long lasting conditions that may get worse. Women are affected more often than men. The majority of those affected are under forty years of age. Because we do not know the cause, we do not have ways to  prevent it.  SYMPTOMS   · Fever.  · Loss of appetite.  · Night sweats.  · Joint pain.  · Aching muscles  Symptoms vary because the disease affects different parts of the body in different people. Most people who see their caregiver with sarcoidosis have lung problems. The first signs are usually a dry cough and shortness of breath. There may also be wheezing, chest pain, or a cough that brings up bloody mucus. In severe cases, lung function may become so poor that the person cannot perform even the simple routine tasks of daily life.  Other symptoms of sarcoidosis are less common than lung symptoms. They can include:  · Skin symptoms. Sarcoidosis can appear as a collection of tender, red bumps called erythema nodosum. These bumps usually occur on the face, shins, and arms. They can also occur as a scaly, purplish discoloration on the nose, cheeks, and ears. This is called lupus pernio. Less often, sarcoidosis causes cysts, pimples, or disfiguring over growths of skin. In many cases, the disfiguring over growths develop in areas of scars or tattoos.  · Eye symptoms. These include redness, eye pain, and sensitivity to light.  · Heart symptoms. These include irregular heartbeat and heart failure.  · Other symptoms. A person may have paralyzed facial muscles, seizures, psychiatric symptoms, swollen salivary glands, or bone pain.  DIAGNOSIS   Even when there are no symptoms, your caregiver can sometimes pick up signs of sarcoidosis during a routine examination, usually through a chest x-ray or when checking other complaints. The patient's age   and race or ethnic group can raise an additional red flag that a sign or symptom could be related to sarcoidosis.   · Enlargement of the salivary or tear glands and cysts in bone tissue may also be caused by sarcoidosis.  · You may have had a biopsy done that shows signs of sarcoidosis. A biopsy is a small tissue sample that is removed for laboratory testing. This tissue sample can  be taken from your lung, skin, lip, or another inflamed or abnormal area of the body.  · You may have had an abnormal chest X-ray. Although you appear healthy, a chest X-ray ordered for other reasons may turn up abnormalities that suggest sarcoidosis.  · Other tests may be needed. These tests may be done to rule out other illnesses or to determine the amount of organ damage caused by sarcoidosis. Some of the most common tests are:  · Blood levels of calcium or angiotensin-converting enzyme may be high in people with sarcoidosis.  · Blood tests to evaluate how well your liver is functioning.  · Lung function tests to measure how well you are breathing.  · A complete eye examination.  TREATMENT   If sarcoidosis does not cause any problems, treatment may not be necessary. Your caregiver may decide to simply monitor your condition. As part of this monitoring process, you may have frequent office visits, follow-up chest X-rays, and tests of your lung function.If you have signs of moderate or severe lung disease, your doctor may recommend:  · A corticosteroid drug, such as prednisone (sold under several brand names).  · Corticosteroids also are used to treat sarcoidosis of the eyes, joints, skin, nerves, or heart.  · Corticosteroid eye drops may be used for the eyes.  · Over-the-counter medications like nonsteroidal anti-inflammatory drugs (NSAID) often are used to treat joint pain first before corticosteroids, which tend to have more side effects.  · If corticosteroids are not effective or cause serious side effects, other drugs that alter or suppress the immune system may be used.  · In rare cases, when sarcoidosis causes life-threatening lung disease, a lung transplant may be necessary. However, there is some risk that the new lungs also will be attacked by sarcoidosis.  SEEK IMMEDIATE MEDICAL CARE IF:   · You suffer from shortness of breath or a lingering cough.  · You develop new problems that may be related to the  disease. Remember this disease can affect almost all organs of the body and cause many different problems.  Document Released: 10/13/2004 Document Revised: 03/06/2012 Document Reviewed: 03/23/2006  ExitCare® Patient Information ©2014 [employer].

## 2013-08-25 ENCOUNTER — Encounter: Payer: Self-pay | Admitting: Family Medicine

## 2013-09-19 ENCOUNTER — Other Ambulatory Visit: Payer: Self-pay | Admitting: Physician Assistant

## 2013-10-15 ENCOUNTER — Other Ambulatory Visit: Payer: Self-pay | Admitting: Physician Assistant

## 2013-10-18 ENCOUNTER — Telehealth: Payer: Self-pay

## 2013-10-18 NOTE — Telephone Encounter (Signed)
Patient states that [facility] needs prior authorization for her Flonase.[phone]  [phone]

## 2013-10-19 NOTE — Telephone Encounter (Signed)
Called [facility] (I've never had to do a PA on generic Flonase). They stated that it does not require a PA and pt does have RFs on file, so she can call and order a RF today. I notified pt of this and gave her the date we sent in the most recent Rx w/1 year of RFs.

## 2013-11-20 ENCOUNTER — Ambulatory Visit (INDEPENDENT_AMBULATORY_CARE_PROVIDER_SITE_OTHER): Payer: BC Managed Care – PPO | Admitting: Physician Assistant

## 2013-11-20 VITALS — BP 150/70 | HR 95 | Temp 98.3°F | Resp 16 | Ht 61.0 in | Wt 174.8 lb

## 2013-11-20 DIAGNOSIS — J029 Acute pharyngitis, unspecified: Secondary | ICD-10-CM

## 2013-11-20 DIAGNOSIS — M545 Low back pain, unspecified: Secondary | ICD-10-CM | POA: Insufficient documentation

## 2013-11-20 DIAGNOSIS — I10 Essential (primary) hypertension: Secondary | ICD-10-CM

## 2013-11-20 DIAGNOSIS — J019 Acute sinusitis, unspecified: Secondary | ICD-10-CM

## 2013-11-20 LAB — POCT RAPID STREP A (OFFICE): Rapid Strep A Screen: NEGATIVE

## 2013-11-20 MED ORDER — GUAIFENESIN ER 1200 MG PO TB12: 1.0000 | ORAL_TABLET | Freq: Two times a day (BID) | ORAL | Status: DC | PRN

## 2013-11-20 MED ORDER — CEFDINIR 300 MG PO CAPS: 600.0000 mg | ORAL_CAPSULE | Freq: Every day | ORAL | Status: DC

## 2013-11-20 MED ORDER — PREDNISONE 20 MG PO TABS: ORAL_TABLET | ORAL | Status: DC

## 2013-11-20 MED ORDER — IPRATROPIUM BROMIDE 0.03 % NA SOLN: 2.0000 | Freq: Two times a day (BID) | NASAL | Status: DC

## 2013-11-20 NOTE — Progress Notes (Signed)
[address]  [place], [place] [address]  [phone]  [email or web address]  Subjective:   [address]  [place], [place] [address]  [phone]  [email or web address]   Patient ID: Kristina Mann, female    DOB: 1946/03/28, 67 y.o.   MRN: [id number]  PCP: Tally Due, MD  Chief Complaint  Patient presents with  . Sore Throat    X Wednesday  . Cough    Minor, X Wednesday  . Sinus Congestion    Minor congestion, X Wednesday    HPI This 67 y.o. female presents for evaluation of sinus symptoms and sore throat.  Began with a sore throat.  Rested, but didn't have any significant improvement, and then got much worse over night.  Some nasal congestion and drainage (yellow).  Mild non-productive cough.   RIGHT ear has intermittent crackling sound. Generally feels bad, and low energy.  No fever ("I rarely get fever").  No identified exposure to allergic triggers.  Medications, allergies, past medical history, surgical history, family history, social history and problem list reviewed and updated. Of note, she has sarcoidosis.   Review of Systems As above.    Objective:   Physical Exam  Vitals reviewed. Constitutional: She is oriented to person, place, and time. Vital signs are normal. She appears well-developed and well-nourished. No distress.  HENT:  Head: Normocephalic and atraumatic.  Right Ear: Hearing, tympanic membrane, external ear and ear canal normal.  Left Ear: Hearing, tympanic membrane, external ear and ear canal normal.  Nose: Mucosal edema and rhinorrhea present.  No foreign bodies. Right sinus exhibits no maxillary sinus tenderness and no frontal sinus tenderness. Left sinus exhibits no maxillary sinus tenderness and no frontal sinus tenderness.  Mouth/Throat: Uvula is midline, oropharynx is clear and moist and mucous membranes are normal. No uvula swelling. No oropharyngeal exudate.  Eyes: Conjunctivae and EOM are normal. Pupils are equal, round, and reactive to  light. Right eye exhibits no discharge. Left eye exhibits no discharge. No scleral icterus.  Neck: Trachea normal, normal range of motion and full passive range of motion without pain. Neck supple. No mass and no thyromegaly present.  Cardiovascular: Normal rate, regular rhythm and normal heart sounds.   Pulmonary/Chest: Effort normal and breath sounds normal.  Lymphadenopathy:       Head (right side): No submandibular, no tonsillar, no preauricular, no posterior auricular and no occipital adenopathy present.       Head (left side): No submandibular, no tonsillar, no preauricular and no occipital adenopathy present.    She has no cervical adenopathy.       Right: No supraclavicular adenopathy present.       Left: No supraclavicular adenopathy present.  Neurological: She is alert and oriented to person, place, and time. She has normal strength. No cranial nerve deficit or sensory deficit.  Skin: Skin is warm, dry and intact. No rash noted.  Psychiatric: She has a normal mood and affect. Her speech is normal and behavior is normal.      Results for orders placed in visit on 11/20/13  POCT RAPID STREP A (OFFICE)      Result Value Range   Rapid Strep A Screen Negative  Negative       Assessment & Plan:  Acute pharyngitis - Plan: POCT rapid strep A, Culture, Group A Strep  Acute sinusitis, unspecified - Plan: cefdinir (OMNICEF) 300 MG capsule, Guaifenesin (MUCINEX MAXIMUM STRENGTH) 1200 MG TB12, ipratropium (ATROVENT) 0.03 % nasal spray, predniSONE (DELTASONE) 20 MG tablet  Brittnee Gaetano S.  Leotis Shames, PA-C Physician Assistant-Certified [facility]

## 2013-11-20 NOTE — Patient Instructions (Signed)
Get plenty of rest and drink at least 64 ounces of water daily. You may fill the prescription for prednisone now, or hold on to it to see if the other medications do the job.  If you haven't had significant improvement in the next 2-3 days, go ahead and fill it.

## 2013-11-21 LAB — CULTURE, GROUP A STREP: Organism ID, Bacteria: NORMAL

## 2013-12-05 ENCOUNTER — Other Ambulatory Visit: Payer: Self-pay | Admitting: Dermatology

## 2013-12-07 ENCOUNTER — Encounter: Payer: Self-pay | Admitting: Family Medicine

## 2013-12-07 ENCOUNTER — Ambulatory Visit (INDEPENDENT_AMBULATORY_CARE_PROVIDER_SITE_OTHER): Payer: BC Managed Care – PPO | Admitting: Family Medicine

## 2013-12-07 VITALS — BP 110/70 | HR 90 | Temp 98.3°F | Resp 16 | Ht 60.5 in | Wt 176.0 lb

## 2013-12-07 DIAGNOSIS — R05 Cough: Secondary | ICD-10-CM

## 2013-12-07 DIAGNOSIS — B349 Viral infection, unspecified: Secondary | ICD-10-CM

## 2013-12-07 DIAGNOSIS — B9789 Other viral agents as the cause of diseases classified elsewhere: Secondary | ICD-10-CM

## 2013-12-07 DIAGNOSIS — R059 Cough, unspecified: Secondary | ICD-10-CM

## 2013-12-07 LAB — POCT CBC
Granulocyte percent: 82.1 %G — AB (ref 37–80)
HCT, POC: 43.8 % (ref 37.7–47.9)
Hemoglobin: 13.8 g/dL (ref 12.2–16.2)
Lymph, poc: 0.9 (ref 0.6–3.4)
MCH, POC: 28.5 pg (ref 27–31.2)
MCHC: 31.5 g/dL — AB (ref 31.8–35.4)
MCV: 90.3 fL (ref 80–97)
MID (cbc): 0.5 (ref 0–0.9)
MPV: 9.1 fL (ref 0–99.8)
POC Granulocyte: 6.6 (ref 2–6.9)
POC LYMPH PERCENT: 11.6 %L (ref 10–50)
POC MID %: 6.3 %M (ref 0–12)
Platelet Count, POC: 317 10*3/uL (ref 142–424)
RBC: 4.85 M/uL (ref 4.04–5.48)
RDW, POC: 16 %
WBC: 8 10*3/uL (ref 4.6–10.2)

## 2013-12-07 LAB — POCT INFLUENZA A/B
Influenza A, POC: NEGATIVE
Influenza B, POC: NEGATIVE

## 2013-12-07 MED ORDER — BENZONATATE 100 MG PO CAPS: ORAL_CAPSULE | ORAL | Status: DC

## 2013-12-07 NOTE — Patient Instructions (Signed)
Drink plenty of fluids and get enough rest  Practice good handwashing had to try to avoid being in people spaces. If necessary wear a face mask.  Tessalon one or 2 every 6 or 8 hours for cough  DM from over-the-counter as necessary  Continue your Allegra and nose sprays  Take Imodium if diarrhea gets worse  Return if worse

## 2013-12-07 NOTE — Progress Notes (Signed)
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 She apparently did better, and then yesterday started feeling worse again. She developed a cough and reports getting a little bit raspy. Her head is congested and she is blowing stuff out. She only has a slight sore throat. She was not documented to be febrile at home though she says her regular body temperature is 97.6 and she was 98. She does not smoke. She did work yesterday. During the night last night she had nausea without vomiting. She did have diarrhea about 4 times. She has not had a flu shot.  She does have a history of sarcoidosis. She is on medicine for her blood pressure.  Objective: Pleasant alert lady in no major distress but congested. Her TMs are normal. Throat is minimally erythematous. Neck supple without significant nodes. Chest is clear to auscultation. Heart regular without murmurs gallops or arrhythmias. She feels some crackles in her chest and she wonders whether it's just for sarcoidosis, but I do not hear them auscultation.  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  Plan: CBC Results for orders placed in visit on 12/07/13  POCT CBC      Result Value Range   WBC 8.0  4.6 - 10.2 K/uL   Lymph, poc 0.9  0.6 - 3.4   POC LYMPH PERCENT 11.6  10 - 50 %L   MID (cbc) 0.5  0 - 0.9   POC MID % 6.3  0 - 12 %M   POC Granulocyte 6.6  2 - 6.9   Granulocyte percent 82.1 (*) 37 - 80 %G   RBC 4.85  4.04 - 5.48 M/uL   Hemoglobin 13.8  12.2 - 16.2 g/dL   HCT, POC 16.1  09.6 - 47.9 %   MCV 90.3  80 - 97 fL   MCH, POC 28.5  27 - 31.2 pg   MCHC 31.5 (*) 31.8 - 35.4 g/dL   RDW, POC 04.5     Platelet Count, POC 317  142 - 424 K/uL   MPV 9.1  0 - 99.8 fL  POCT INFLUENZA A/B      Result Value Range   Influenza A, POC Negative     Influenza B, POC Negative

## 2013-12-22 ENCOUNTER — Other Ambulatory Visit: Payer: Self-pay | Admitting: Physician Assistant

## 2013-12-29 ENCOUNTER — Ambulatory Visit (INDEPENDENT_AMBULATORY_CARE_PROVIDER_SITE_OTHER): Payer: BC Managed Care – PPO | Admitting: *Deleted

## 2013-12-29 ENCOUNTER — Other Ambulatory Visit: Payer: Self-pay | Admitting: Physician Assistant

## 2013-12-29 DIAGNOSIS — Z23 Encounter for immunization: Secondary | ICD-10-CM

## 2014-02-25 ENCOUNTER — Other Ambulatory Visit: Payer: Self-pay | Admitting: Internal Medicine

## 2014-02-26 ENCOUNTER — Other Ambulatory Visit: Payer: Self-pay | Admitting: Internal Medicine

## 2014-03-03 ENCOUNTER — Ambulatory Visit (INDEPENDENT_AMBULATORY_CARE_PROVIDER_SITE_OTHER): Payer: BC Managed Care – PPO | Admitting: Internal Medicine

## 2014-03-03 VITALS — BP 132/86 | HR 82 | Temp 98.4°F | Resp 16

## 2014-03-03 DIAGNOSIS — M79644 Pain in right finger(s): Secondary | ICD-10-CM

## 2014-03-03 DIAGNOSIS — S61209A Unspecified open wound of unspecified finger without damage to nail, initial encounter: Secondary | ICD-10-CM

## 2014-03-03 DIAGNOSIS — M79609 Pain in unspecified limb: Secondary | ICD-10-CM

## 2014-03-03 NOTE — Progress Notes (Signed)
   Patient ID: Kristina Mann MRN: [id number], DOB: 1946/03/18, 68 y.o. Date of Encounter: 03/03/2014, 7:12 PM   PROCEDURE NOTE: Verbal consent obtained.  Risks and benefits of the procedure were explained. Patient made an informed decision to proceed with the procedure. Sterile technique employed. Numbing: Anesthesia obtained with 2% plain lidocaine 3 cc for digit block.  Cleansed with soap and water. Irrigated. Betadine prep per usual protocol.  Penrose applied x 2 for 1 minute each time to explore the wound.  Wound explored, no deep structures involved, no foreign bodies.   Wound repaired with # 3 simple interrupted sutures of 5-0 Prolene.  Hemostasis obtained. Wound cleansed and dressed.  Wound care instructions including precautions covered with patient. Handout given.  Anticipate suture removal in 10 days.   Signed, Christell Faith, MHS, PA-C [facility] [place] [place] [id number] [place] 03/03/2014 7:12 PM

## 2014-03-03 NOTE — Progress Notes (Signed)
   Subjective:    Patient ID: Kristina Mann, female    DOB: Oct 08, 1946, 68 y.o.   MRN: [id number]  HPI Cutting celery w/ automatic slicer and cut tip of right index finger Bleeding profusely  Patient Active Problem List   Diagnosis Date Noted  . Low back pain 11/20/2013  . Seasonal allergies 01/20/2012  . Thyroid nodule 01/20/2012  . Sarcoidosis 01/20/2012  . HTN (hypertension) 01/20/2012  . Basal cell cancer 01/20/2012   Kristina Mann does not currently have medications on file. they include lisinopril and chlorthalidone but no blood thinners.  Tetanus is up to date  Review of Systems Noncontributory    Objective:   Physical Exam BP 132/86  Pulse 82  Temp(Src) 98.4 F (36.9 C) (Oral)  Resp 16  SpO2 97% No acute distress Bleeding of the right index finger has been controlled with compression The wound is a slice along the distal aspect that extends from the fingerprint into the lateral nail border/the flap is moderately perfused        Assessment & Plan:  Wound finger-laceration  Sutures per R DunnPAC Wound care// followup for suture removal 10 days

## 2014-03-11 ENCOUNTER — Encounter: Payer: Self-pay | Admitting: Family Medicine

## 2014-03-11 ENCOUNTER — Ambulatory Visit (INDEPENDENT_AMBULATORY_CARE_PROVIDER_SITE_OTHER): Payer: BC Managed Care – PPO | Admitting: Family Medicine

## 2014-03-11 VITALS — BP 120/60 | HR 74 | Temp 98.2°F | Resp 16 | Ht 60.5 in | Wt 175.2 lb

## 2014-03-11 DIAGNOSIS — J449 Chronic obstructive pulmonary disease, unspecified: Secondary | ICD-10-CM

## 2014-03-11 DIAGNOSIS — Z0000 Encounter for general adult medical examination without abnormal findings: Secondary | ICD-10-CM

## 2014-03-11 DIAGNOSIS — I10 Essential (primary) hypertension: Secondary | ICD-10-CM

## 2014-03-11 LAB — COMPREHENSIVE METABOLIC PANEL
ALT: 21 U/L (ref 0–35)
AST: 19 U/L (ref 0–37)
Albumin: 4.4 g/dL (ref 3.5–5.2)
Alkaline Phosphatase: 69 U/L (ref 39–117)
BUN: 14 mg/dL (ref 6–23)
CO2: 29 mEq/L (ref 19–32)
Calcium: 9.6 mg/dL (ref 8.4–10.5)
Chloride: 104 mEq/L (ref 96–112)
Creat: 0.75 mg/dL (ref 0.50–1.10)
Glucose, Bld: 95 mg/dL (ref 70–99)
Potassium: 4.2 mEq/L (ref 3.5–5.3)
Sodium: 141 mEq/L (ref 135–145)
Total Bilirubin: 0.4 mg/dL (ref 0.2–1.2)
Total Protein: 6.7 g/dL (ref 6.0–8.3)

## 2014-03-11 LAB — LIPID PANEL
Cholesterol: 191 mg/dL (ref 0–200)
HDL: 59 mg/dL (ref >39)
LDL Cholesterol: 108 mg/dL — ABNORMAL HIGH (ref 0–99)
Total CHOL/HDL Ratio: 3.2 Ratio
Triglycerides: 121 mg/dL (ref <150)
VLDL: 24 mg/dL (ref 0–40)

## 2014-03-11 MED ORDER — LISINOPRIL 5 MG PO TABS: ORAL_TABLET | ORAL | Status: DC

## 2014-03-11 MED ORDER — FLUTICASONE-SALMETEROL 250-50 MCG/DOSE IN AEPB: INHALATION_SPRAY | RESPIRATORY_TRACT | Status: DC

## 2014-03-11 MED ORDER — CHLORTHALIDONE 25 MG PO TABS: ORAL_TABLET | ORAL | Status: DC

## 2014-03-11 NOTE — Patient Instructions (Addendum)
We will update your pneumovax now that you are 67.  You should receive the 13 valent, followed by the 23 valent in one year.    Your mammogram is now due- please give [facility] a call.   I will be in touch with your labs when they come in- if you wish, sign up for mychart for faster results.    It is time to update your colonoscopy. Please give [facility] a call.   Address: [address], [place], [place] [address]  [phone]
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
## 2014-04-04 ENCOUNTER — Encounter: Payer: Self-pay | Admitting: Family Medicine

## 2014-06-07 ENCOUNTER — Ambulatory Visit (INDEPENDENT_AMBULATORY_CARE_PROVIDER_SITE_OTHER): Payer: BC Managed Care – PPO | Admitting: Family Medicine

## 2014-06-07 VITALS — BP 126/70 | HR 89 | Temp 98.6°F | Resp 18 | Ht 61.0 in | Wt 174.0 lb

## 2014-06-07 DIAGNOSIS — R059 Cough, unspecified: Secondary | ICD-10-CM

## 2014-06-07 DIAGNOSIS — J329 Chronic sinusitis, unspecified: Secondary | ICD-10-CM

## 2014-06-07 DIAGNOSIS — J209 Acute bronchitis, unspecified: Secondary | ICD-10-CM

## 2014-06-07 DIAGNOSIS — R05 Cough: Secondary | ICD-10-CM

## 2014-06-07 MED ORDER — AMOXICILLIN-POT CLAVULANATE 875-125 MG PO TABS: 1.0000 | ORAL_TABLET | Freq: Two times a day (BID) | ORAL | Status: DC

## 2014-06-07 NOTE — Patient Instructions (Signed)

## 2014-06-07 NOTE — Progress Notes (Signed)
68 year old retired woman comes in with her husband Hank. She's been having several days of progressive cough and sinus congestion. He got much worse yesterday. She had trouble sleeping last night and took some Tessalon which did help a little bit.  She was exposed to an acute infection from her grandson 2 weeks ago was treated with Z-Pak.  Patient has an underlying condition of sarcoid.  Objective: EENT: Marked with a red right nasal passage with mucopurulent chart Oropharynx: Mild erythema Neck: Supple no adenopathy Chest: Few expiratory wheezes in the right lung Heart: Regular no murmur Skin: No rash  Assessment: Sinus infection with bronchitis, acute. Patient has an underlying sarcoid to consider but she's not so sick that steroids are indicated at this point.  Rather, she should continue her Advair and fluticasone and I'll call in a prescription for Augmentin 875 mg twice a day for 10 days.  Patient can call if she's not doing better. She'll be attending the funeral of her sister-in-law who died last night in [place] ?, Robyn Haber M.D.

## 2014-06-21 ENCOUNTER — Ambulatory Visit (INDEPENDENT_AMBULATORY_CARE_PROVIDER_SITE_OTHER): Payer: BC Managed Care – PPO | Admitting: Emergency Medicine

## 2014-06-21 VITALS — BP 126/70 | HR 95 | Temp 98.2°F | Ht 60.0 in | Wt 175.2 lb

## 2014-06-21 DIAGNOSIS — R059 Cough, unspecified: Secondary | ICD-10-CM

## 2014-06-21 DIAGNOSIS — B349 Viral infection, unspecified: Secondary | ICD-10-CM

## 2014-06-21 DIAGNOSIS — J029 Acute pharyngitis, unspecified: Secondary | ICD-10-CM

## 2014-06-21 DIAGNOSIS — B9789 Other viral agents as the cause of diseases classified elsewhere: Secondary | ICD-10-CM

## 2014-06-21 DIAGNOSIS — R05 Cough: Secondary | ICD-10-CM

## 2014-06-21 LAB — POCT RAPID STREP A (OFFICE): Rapid Strep A Screen: NEGATIVE

## 2014-06-21 MED ORDER — FIRST-DUKES MOUTHWASH MT SUSP: OROMUCOSAL | Status: DC

## 2014-06-21 MED ORDER — BENZONATATE 100 MG PO CAPS: 100.0000 mg | ORAL_CAPSULE | Freq: Three times a day (TID) | ORAL | Status: DC | PRN

## 2014-06-21 NOTE — Patient Instructions (Addendum)
Sore Throat A sore throat is pain, burning, irritation, or scratchiness of the throat. There is often pain or tenderness when swallowing or talking. A sore throat may be accompanied by other symptoms, such as coughing, sneezing, fever, and swollen neck glands. A sore throat is often the first sign of another sickness, such as a cold, flu, strep throat, or mononucleosis (commonly known as mono). Most sore throats go away without medical treatment. CAUSES  The most common causes of a sore throat include:  A viral infection, such as a cold, flu, or mono.  A bacterial infection, such as strep throat, tonsillitis, or whooping cough.  Seasonal allergies.  Dryness in the air.  Irritants, such as smoke or pollution.  Gastroesophageal reflux disease (GERD). HOME CARE INSTRUCTIONS   Only take over-the-counter medicines as directed by your caregiver.  Drink enough fluids to keep your urine clear or pale yellow.  Rest as needed.  Try using throat sprays, lozenges, or sucking on hard candy to ease any pain (if older than 4 years or as directed).  Sip warm liquids, such as broth, herbal tea, or warm water with honey to relieve pain temporarily. You may also eat or drink cold or frozen liquids such as frozen ice pops.  Gargle with salt water (mix 1 tsp salt with 8 oz of water).  Do not smoke and avoid secondhand smoke.  Put a cool-mist humidifier in your bedroom at night to moisten the air. You can also turn on a hot shower and sit in the bathroom with the door closed for 5-10 minutes. SEEK IMMEDIATE MEDICAL CARE IF:  You have difficulty breathing.  You are unable to swallow fluids, soft foods, or your saliva.  You have increased swelling in the throat.  Your sore throat does not get better in 7 days.  You have nausea and vomiting.  You have a fever or persistent symptoms for more than 2-3 days.  You have a fever and your symptoms suddenly get worse. MAKE SURE YOU:   Understand  these instructions.  Will watch your condition.  Will get help right away if you are not doing well or get worse. Document Released: 01/20/2005 Document Revised: 11/29/2012 Document Reviewed: 08/20/2012 [facility] Patient Information 2015 [place], [place]. This information is not intended to replace advice given to you by your health care provider. Make sure you discuss any questions you have with your health care provider. Cough, Adult  A cough is a reflex that helps clear your throat and airways. It can help heal the body or may be a reaction to an irritated airway. A cough may only last 2 or 3 weeks (acute) or may last more than 8 weeks (chronic).  CAUSES Acute cough:  Viral or bacterial infections. Chronic cough:  Infections.  Allergies.  Asthma.  Post-nasal drip.  Smoking.  Heartburn or acid reflux.  Some medicines.  Chronic lung problems (COPD).  Cancer. SYMPTOMS   Cough.  Fever.  Chest pain.  Increased breathing rate.  High-pitched whistling sound when breathing (wheezing).  Colored mucus that you cough up (sputum). TREATMENT   A bacterial cough may be treated with antibiotic medicine.  A viral cough must run its course and will not respond to antibiotics.  Your caregiver may recommend other treatments if you have a chronic cough. HOME CARE INSTRUCTIONS   Only take over-the-counter or prescription medicines for pain, discomfort, or fever as directed by your caregiver. Use cough suppressants only as directed by your caregiver.  Use a cold  steam vaporizer or humidifier in your bedroom or home to help loosen secretions.  Sleep in a semi-upright position if your cough is worse at night.  Rest as needed.  Stop smoking if you smoke. SEEK IMMEDIATE MEDICAL CARE IF:   You have pus in your sputum.  Your cough starts to worsen.  You cannot control your cough with suppressants and are losing sleep.  You begin coughing up blood.  You have difficulty  breathing.  You develop pain which is getting worse or is uncontrolled with medicine.  You have a fever. MAKE SURE YOU:   Understand these instructions.  Will watch your condition.  Will get help right away if you are not doing well or get worse. Document Released: 06/11/2011 Document Revised: 03/06/2012 Document Reviewed: 06/11/2011 [facility] Patient Information 2015 [place], [place]. This information is not intended to replace advice given to you by your health care provider. Make sure you discuss any questions you have with your health care provider.

## 2014-06-21 NOTE — Progress Notes (Signed)
Subjective:  This chart was scribed for Arlyss Queen, MD by Mercy Moore, Medial Scribe. This patient was seen in room 14 and the patient's care was started at 6:44 PM.    Patient ID: Kristina Mann, female    DOB: [birth year], 68 y.o.   MRN: [id number]  HPI HPI Comments: Kristina Mann is a 68 y.o. female who presents to the Emergency Department complaining of sore throat and  continued dry, nonproductive cough. Patient recently seen by Dr. Joseph Art for sinus infection and bronchitis. Patient prescribed Augmentin. Patient reports disturbed sleep due to her coughing. Patient's husband has similar symptoms.   Patient denies acid reflux or belching. Patient reports history of mild allergies and sarcoidosis for which she has been taking Advair and Allegra D.   Patient Active Problem List   Diagnosis Date Noted  . Low back pain 11/20/2013  . Seasonal allergies 01/20/2012  . Thyroid nodule 01/20/2012  . Sarcoidosis 01/20/2012  . HTN (hypertension) 01/20/2012  . Basal cell cancer 01/20/2012   Past Medical History  Diagnosis Date  . Hypertension   . Thyroid dysfunction      NODULE ABN CELLS 2006  . Bronchitis   . Chicken pox   . Sarcoidosis   . Seasonal allergies   . Basal cell cancer 06/2005  . Mass 2009    RETROPERITONEAL/PERINEPHRIC  . Allergy    Past Surgical History  Procedure Laterality Date  . Abdominal hysterectomy    . Gallbladder surgery    . Tonsillectomy and adenoidectomy    . Appendectomy    . 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 Wrist surgery      LEFT CYST  . Tubal ligation     Allergies  Allergen Reactions  . Codeine Nausea And Vomiting   Prior to Admission medications   Medication Sig Start Date End Date Taking? Authorizing Provider  amoxicillin-clavulanate (AUGMENTIN) 875-125 MG per tablet Take 1 tablet by mouth 2 (two) times daily. 06/07/14  Yes Robyn Haber, MD  aspirin 81 MG chewable tablet Chew 81 mg by mouth daily.   Yes Historical Provider, MD  benzonatate  (TESSALON) 100 MG capsule Take 1-2 tablets 3-4 times daily for cough 12/07/13  Yes Posey Boyer, MD  calcium-vitamin D 250-100 MG-UNIT per tablet Take 1 tablet by mouth 2 (two) times daily.   Yes Historical Provider, MD  chlorthalidone (HYGROTON) 25 MG tablet TAKE 1/2 TABLET BY MOUTH EVERY DAY FOR BLOOD PRESSURE 03/11/14  Yes Jessica C Copland, MD  fexofenadine-pseudoephedrine (ALLEGRA-D 24) 180-240 MG per 24 hr tablet Take 1 tablet by mouth daily.   Yes Historical Provider, MD  fluticasone (FLONASE) 50 MCG/ACT nasal spray USE 2 SPRAYS IN [facility]       NOSTRIL DAILY 09/19/13  Yes Heather M Marte, PA-C  Fluticasone-Salmeterol (ADVAIR DISKUS) 250-50 MCG/DOSE AEPB USE 1 PUFF TWICE DAILY 03/11/14  Yes Gay Filler Copland, MD  lisinopril (PRINIVIL,ZESTRIL) 5 MG tablet TAKE 1 TABLET BY MOUTH DAILY 03/11/14  Yes Gay Filler Copland, MD  Multiple Vitamin (MULTIVITAMIN) capsule Take 1 capsule by mouth daily.   Yes Historical Provider, MD  OVER THE COUNTER MEDICATION Biotin skin, hair, nails taking   Yes Historical Provider, MD  OVER THE COUNTER MEDICATION OTC Co Q10 taking   Yes Historical Provider, MD  OVER THE COUNTER MEDICATION Glucosamine Chondriotin taking   Yes Historical Provider, MD  OVER THE COUNTER MEDICATION Krill Oil taking   Yes Historical Provider, MD  OVER THE COUNTER MEDICATION Garlic tablets taking  Yes Historical Provider, MD   History   Social History  . Marital Status: Married    Spouse Name: Mallie Mussel    Number of Children: 3  . Years of Education: 16   Occupational History  . Customer Service    Social History Main Topics  . Smoking status: Never Smoker   . Smokeless tobacco: Never Used  . Alcohol Use: 0.0 - 0.6 oz/week    0-1 Glasses of wine per week  . Drug Use: No  . Sexual Activity: Not Currently   Other Topics Concern  . Not on file   Social History Narrative   O+ BLOOD DONATES Q 3 MONTHS.  EXERCISE-WALKS.   Mother in an assisted living facility in [place], where the  patient's sister lives.   The patient lives with her husband.  Her son lives in [place], [place].   One daughter lives in [place], the other in [place]. Patient does exercise.      Review of Systems  Constitutional: Negative for fever and chills.  HENT: Positive for sore throat and voice change. Negative for congestion and rhinorrhea.   Respiratory: Positive for cough. Negative for shortness of breath.   Cardiovascular: Negative for chest pain.  Psychiatric/Behavioral: Negative for confusion.       Objective:   Physical Exam  Nursing note and vitals reviewed.   CONSTITUTIONAL: Well developed/well nourished HEAD: Normocephalic/atraumatic EYES: EOMI/PERRL ENMT: Mucous membranes moist; reddened uvula  NECK: supple no meningeal signs; no adenopathy  SPINE:entire spine nontender CV: S1/S2 noted, no murmurs/rubs/gallops noted LUNGS: Lungs are clear to auscultation bilaterally, no apparent distress ABDOMEN: soft, nontender, no rebound or guarding GU:no cva tenderness NEURO: Pt is awake/alert, moves all extremitiesx4 EXTREMITIES: pulses normal, full ROM SKIN: warm, color normal PSYCH: no abnormalities of mood noted  Filed Vitals:   06/21/14 1844  BP: 126/70  Pulse: 95  Temp: 98.2 F (36.8 C)   Results for orders placed in visit on 06/21/14  POCT RAPID STREP A (OFFICE)      Result Value Ref Range   Rapid Strep A Screen Negative  Negative        Assessment & Plan:  Treat symptomatically with Tessalon Perles and Dukes. I personally performed the services described in this documentation, which was scribed in my presence. The recorded information has been reviewed and is accurate.

## 2014-08-28 ENCOUNTER — Encounter: Payer: Self-pay | Admitting: Internal Medicine

## 2014-09-04 ENCOUNTER — Other Ambulatory Visit: Payer: Self-pay | Admitting: Internal Medicine

## 2014-09-10 ENCOUNTER — Encounter: Payer: Self-pay | Admitting: Internal Medicine

## 2014-09-16 ENCOUNTER — Encounter: Payer: Self-pay | Admitting: Family Medicine

## 2014-09-16 ENCOUNTER — Ambulatory Visit (INDEPENDENT_AMBULATORY_CARE_PROVIDER_SITE_OTHER): Payer: BC Managed Care – PPO | Admitting: Family Medicine

## 2014-09-16 ENCOUNTER — Ambulatory Visit (INDEPENDENT_AMBULATORY_CARE_PROVIDER_SITE_OTHER): Payer: BC Managed Care – PPO

## 2014-09-16 VITALS — BP 122/64 | HR 91 | Temp 98.7°F | Resp 16 | Ht 60.5 in | Wt 174.2 lb

## 2014-09-16 DIAGNOSIS — D751 Secondary polycythemia: Secondary | ICD-10-CM

## 2014-09-16 DIAGNOSIS — R490 Dysphonia: Secondary | ICD-10-CM

## 2014-09-16 DIAGNOSIS — R059 Cough, unspecified: Secondary | ICD-10-CM

## 2014-09-16 DIAGNOSIS — D869 Sarcoidosis, unspecified: Secondary | ICD-10-CM

## 2014-09-16 DIAGNOSIS — R05 Cough: Secondary | ICD-10-CM

## 2014-09-16 DIAGNOSIS — Z23 Encounter for immunization: Secondary | ICD-10-CM

## 2014-09-16 LAB — CBC WITH DIFFERENTIAL/PLATELET
Basophils Absolute: 0.1 10*3/uL (ref 0.0–0.1)
Basophils Relative: 1 % (ref 0–1)
Eosinophils Absolute: 0.1 10*3/uL (ref 0.0–0.7)
Eosinophils Relative: 2 % (ref 0–5)
HCT: 46.6 % — ABNORMAL HIGH (ref 36.0–46.0)
Hemoglobin: 16.2 g/dL — ABNORMAL HIGH (ref 12.0–15.0)
Lymphocytes Relative: 31 % (ref 12–46)
Lymphs Abs: 2.3 10*3/uL (ref 0.7–4.0)
MCH: 29.9 pg (ref 26.0–34.0)
MCHC: 34.8 g/dL (ref 30.0–36.0)
MCV: 86.1 fL (ref 78.0–100.0)
Monocytes Absolute: 0.4 10*3/uL (ref 0.1–1.0)
Monocytes Relative: 6 % (ref 3–12)
Neutro Abs: 4.4 10*3/uL (ref 1.7–7.7)
Neutrophils Relative %: 60 % (ref 43–77)
Platelets: 357 10*3/uL (ref 150–400)
RBC: 5.41 MIL/uL — ABNORMAL HIGH (ref 3.87–5.11)
RDW: 13.8 % (ref 11.5–15.5)
WBC: 7.4 10*3/uL (ref 4.0–10.5)

## 2014-09-16 LAB — COMPREHENSIVE METABOLIC PANEL
ALT: 25 U/L (ref 0–35)
AST: 24 U/L (ref 0–37)
Albumin: 4.5 g/dL (ref 3.5–5.2)
Alkaline Phosphatase: 73 U/L (ref 39–117)
BUN: 13 mg/dL (ref 6–23)
CO2: 29 mEq/L (ref 19–32)
Calcium: 9.9 mg/dL (ref 8.4–10.5)
Chloride: 103 mEq/L (ref 96–112)
Creat: 0.95 mg/dL (ref 0.50–1.10)
Glucose, Bld: 107 mg/dL — ABNORMAL HIGH (ref 70–99)
Potassium: 4 mEq/L (ref 3.5–5.3)
Sodium: 140 mEq/L (ref 135–145)
Total Bilirubin: 0.4 mg/dL (ref 0.2–1.2)
Total Protein: 6.8 g/dL (ref 6.0–8.3)

## 2014-09-16 MED ORDER — AZITHROMYCIN 250 MG PO TABS: ORAL_TABLET | ORAL | Status: AC

## 2014-09-16 NOTE — Patient Instructions (Signed)
I will be in touch with your labs.  We were refer you to ENT to check out your vocal cords/ chronic hoarseness.  Let me know if your cough does not finally improve! Use the azithromycin (zpack) as directed
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
## 2014-09-19 ENCOUNTER — Encounter: Payer: Self-pay | Admitting: Family Medicine

## 2014-09-19 NOTE — Addendum Note (Signed)
Addended by: Lamar Blinks C on: 09/19/2014 04:44 PM   Modules accepted: Orders

## 2014-10-24 ENCOUNTER — Ambulatory Visit (AMBULATORY_SURGERY_CENTER): Payer: BC Managed Care – PPO | Admitting: *Deleted

## 2014-10-24 VITALS — Ht 60.0 in | Wt 176.4 lb

## 2014-10-24 DIAGNOSIS — Z1211 Encounter for screening for malignant neoplasm of colon: Secondary | ICD-10-CM

## 2014-10-24 MED ORDER — MOVIPREP 100 G PO SOLR: ORAL | Status: DC

## 2014-10-24 NOTE — Progress Notes (Signed)
No allergies to eggs or soy. No problems with anesthesia.  Pt given Emmi instructions for colonoscopy  No oxygen use  No diet drug use  

## 2014-11-04 ENCOUNTER — Encounter: Payer: Self-pay | Admitting: Internal Medicine

## 2014-11-08 ENCOUNTER — Ambulatory Visit (AMBULATORY_SURGERY_CENTER): Payer: BC Managed Care – PPO | Admitting: Internal Medicine

## 2014-11-08 ENCOUNTER — Encounter: Payer: Self-pay | Admitting: Internal Medicine

## 2014-11-08 VITALS — BP 113/73 | HR 73 | Temp 97.3°F | Resp 21 | Ht 60.0 in | Wt 176.0 lb

## 2014-11-08 DIAGNOSIS — D125 Benign neoplasm of sigmoid colon: Secondary | ICD-10-CM

## 2014-11-08 DIAGNOSIS — Z1211 Encounter for screening for malignant neoplasm of colon: Secondary | ICD-10-CM

## 2014-11-08 HISTORY — PX: COLONOSCOPY: SHX174

## 2014-11-08 MED ORDER — SODIUM CHLORIDE 0.9 % IV SOLN: 500.0000 mL | INTRAVENOUS | Status: DC

## 2014-11-08 NOTE — Op Note (Signed)
[facility]  [address]. [place], [address]   COLONOSCOPY PROCEDURE REPORT  PATIENT: Kristina Mann, Kristina Mann  MR#: [id number] BIRTHDATE: Jun 13, 1946 , 77  yrs. old GENDER: female ENDOSCOPIST: Lafayette Dragon, MD REFERRED GY:[id number] Copland, M.D. PROCEDURE DATE:  11/08/2014 PROCEDURE:   Colonoscopy with snare polypectomy First Screening Colonoscopy - Avg.  risk and is 50 yrs.  old or older - No.  Prior Negative Screening - Now for repeat screening. 10 or more years since last screening  History of Adenoma - Now for follow-up colonoscopy & has been > or = to 3 yrs.  N/A  Polyps Removed Today? Yes. ASA CLASS:   Class II INDICATIONS:average risk for colon cancer and prior colonoscopy December 2005 showed mild diverticulosis. MEDICATIONS: Monitored anesthesia care and Propofol 290 mg IV  DESCRIPTION OF PROCEDURE:   After the risks benefits and alternatives of the procedure were thoroughly explained, informed consent was obtained.  The digital rectal exam revealed no abnormalities of the rectum.   The LB [id number]  endoscope was introduced through the anus and advanced to the cecum, which was identified by both the appendix and ileocecal valve. No adverse events experienced.   The quality of the prep was good, using MoviPrep  The instrument was then slowly withdrawn as the colon was fully examined.      COLON FINDINGS: A firm sessile polyp measuring 9 mm in size was found in the sigmoid colon.  A polypectomy was performed with a cold snare.  The resection was complete, the polyp tissue was completely retrieved and sent to histology.   There was mild diverticulosis noted throughout the entire examined colon. Retroflexed views revealed no abnormalities. The time to cecum= 10.38min, .  Withdrawal time=8 minutes 10 seconds.  The scope was withdrawn and the procedure completed. COMPLICATIONS: There were no immediate complications.  ENDOSCOPIC IMPRESSION: 1.   Sessile  polyp was found in the sigmoid colon; polypectomy was performed with a cold snare 2.   Mild diverticulosis was noted throughout the entire examined colon  RECOMMENDATIONS: 1.  Await pathology results 2.  High-fiber diet Recall colonoscopy pending path report  eSigned:  Lafayette Dragon, MD 11/08/2014 10:41 AM   cc:   PATIENT NAME:  Kristina Mann, Kristina Mann MR#: [id number]

## 2014-11-08 NOTE — Progress Notes (Signed)
Called to room to assist during endoscopic procedure.  Patient ID and intended procedure confirmed with present staff. Received instructions for my participation in the procedure from the performing physician.  

## 2014-11-08 NOTE — Patient Instructions (Signed)
YOU HAD AN ENDOSCOPIC PROCEDURE TODAY AT THE [facility]: Refer to the procedure report that was given to you for any specific questions about what was found during the examination.  If the procedure report does not answer your questions, please call your gastroenterologist to clarify.  If you requested that your care partner not be given the details of your procedure findings, then the procedure report has been included in a sealed envelope for you to review at your convenience later.  YOU SHOULD EXPECT: Some feelings of bloating in the abdomen. Passage of more gas than usual.  Walking can help get rid of the air that was put into your GI tract during the procedure and reduce the bloating. If you had a lower endoscopy (such as a colonoscopy or flexible sigmoidoscopy) you may notice spotting of blood in your stool or on the toilet paper. If you underwent a bowel prep for your procedure, then you may not have a normal bowel movement for a few days.  DIET: Your first meal following the procedure should be a light meal and then it is ok to progress to your normal diet.  A half-sandwich or bowl of soup is an example of a good first meal.  Heavy or fried foods are harder to digest and may make you feel nauseous or bloated.  Likewise meals heavy in dairy and vegetables can cause extra gas to form and this can also increase the bloating.  Drink plenty of fluids but you should avoid alcoholic beverages for 24 hours.  ACTIVITY: Your care partner should take you home directly after the procedure.  You should plan to take it easy, moving slowly for the rest of the day.  You can resume normal activity the day after the procedure however you should NOT DRIVE or use heavy machinery for 24 hours (because of the sedation medicines used during the test).    SYMPTOMS TO REPORT IMMEDIATELY: A gastroenterologist can be reached at any hour.  During normal business hours, 8:30 AM to 5:00 PM Monday through Friday,  call [phone].  After hours and on weekends, please call the GI answering service at [phone] who will take a message and have the physician on call contact you.   Following lower endoscopy (colonoscopy or flexible sigmoidoscopy):  Excessive amounts of blood in the stool  Significant tenderness or worsening of abdominal pains  Swelling of the abdomen that is new, acute  Fever of 100F or higher    FOLLOW UP: If any biopsies were taken you will be contacted by phone or by letter within the next 1-3 weeks.  Call your gastroenterologist if you have not heard about the biopsies in 3 weeks.  Our staff will call the home number listed on your records the next business day following your procedure to check on you and address any questions or concerns that you may have at that time regarding the information given to you following your procedure. This is a courtesy call and so if there is no answer at the home number and we have not heard from you through the emergency physician on call, we will assume that you have returned to your regular daily activities without incident.  SIGNATURES/CONFIDENTIALITY: You and/or your care partner have signed paperwork which will be entered into your electronic medical record.  These signatures attest to the fact that that the information above on your After Visit Summary has been reviewed and is understood.  Full responsibility of the confidentiality   of this discharge information lies with you and/or your care-partner.  Polyp, diverticulosis, and high fiber diet information given. 

## 2014-11-08 NOTE — Progress Notes (Signed)
Procedure ends, to recovery, report given and VSS. 

## 2014-11-11 ENCOUNTER — Telehealth: Payer: Self-pay | Admitting: *Deleted

## 2014-11-11 NOTE — Telephone Encounter (Signed)
  Follow up Call-  Call back number 11/08/2014  Post procedure Call Back phone  # [phone]  Permission to leave phone message Yes     Patient questions:  Do you have a fever, pain , or abdominal swelling? No. Pain Score  0 *  Have you tolerated food without any problems? Yes.    Have you been able to return to your normal activities? Yes.    Do you have any questions about your discharge instructions: Diet   No. Medications  No. Follow up visit  No.  Do you have questions or concerns about your Care? No.  Actions: * If pain score is 4 or above: No action needed, pain <4.

## 2014-11-12 ENCOUNTER — Encounter: Payer: Self-pay | Admitting: Internal Medicine

## 2015-02-28 ENCOUNTER — Ambulatory Visit (INDEPENDENT_AMBULATORY_CARE_PROVIDER_SITE_OTHER): Payer: BLUE CROSS/BLUE SHIELD | Admitting: Family Medicine

## 2015-02-28 VITALS — BP 110/80 | HR 85 | Temp 98.9°F | Resp 18 | Wt 172.0 lb

## 2015-02-28 DIAGNOSIS — R059 Cough, unspecified: Secondary | ICD-10-CM

## 2015-02-28 DIAGNOSIS — R5381 Other malaise: Secondary | ICD-10-CM | POA: Diagnosis not present

## 2015-02-28 DIAGNOSIS — R05 Cough: Secondary | ICD-10-CM | POA: Diagnosis not present

## 2015-02-28 LAB — POCT INFLUENZA A/B
Influenza A, POC: NEGATIVE
Influenza B, POC: NEGATIVE

## 2015-02-28 MED ORDER — PROMETHAZINE-DM 6.25-15 MG/5ML PO SYRP: 5.0000 mL | ORAL_SOLUTION | Freq: Four times a day (QID) | ORAL | Status: DC | PRN

## 2015-02-28 MED ORDER — BENZONATATE 100 MG PO CAPS: 100.0000 mg | ORAL_CAPSULE | Freq: Three times a day (TID) | ORAL | Status: DC | PRN
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
## 2015-02-28 NOTE — Patient Instructions (Signed)
It appears that you have a viral illness. Use the tessalon perles and the phenergan DM cough syrup as needed Remember the syrup will make you feel sleepy! Rest and drink plenty of fluids. If you are not feeling better over the next few days please let me know- Sooner if worse.

## 2015-03-03 ENCOUNTER — Other Ambulatory Visit: Payer: Self-pay | Admitting: Family Medicine

## 2015-03-03 ENCOUNTER — Telehealth: Payer: Self-pay

## 2015-03-03 DIAGNOSIS — J209 Acute bronchitis, unspecified: Secondary | ICD-10-CM

## 2015-03-03 MED ORDER — DOXYCYCLINE HYCLATE 100 MG PO CAPS: 100.0000 mg | ORAL_CAPSULE | Freq: Two times a day (BID) | ORAL | Status: DC

## 2015-03-03 NOTE — Telephone Encounter (Signed)
Called her back- she notes sinus pressure and cough that keeps her awake at night. She has tried the phenergan DM and tessalon.  She has noted a temp to 98.7.  Will rx doxycycline for her, she will let me know if not getting better soon

## 2015-03-03 NOTE — Telephone Encounter (Signed)
Assessment and Plan: Malaise - Plan: POCT Influenza A/B  Cough - Plan: POCT Influenza A/B, promethazine-dextromethorphan (PROMETHAZINE-DM) 6.25-15 MG/5ML syrup, benzonatate (TESSALON) 100 MG capsule  Viral URI- does not appear to be flu. She is intolerant to codeine, so will treat with phenergan DM and tessalon as needed   Signed Lamar Blinks, MD   Dr. Lorelei Pont what is the next step?

## 2015-03-03 NOTE — Telephone Encounter (Signed)
Pt states she was seen by Dr.Copland and told if no better to give her a call, she is worse and having a hard time sleeping from coughing. Please call [phone]

## 2015-03-17 ENCOUNTER — Other Ambulatory Visit: Payer: Self-pay | Admitting: Family Medicine

## 2015-03-17 ENCOUNTER — Ambulatory Visit (INDEPENDENT_AMBULATORY_CARE_PROVIDER_SITE_OTHER): Payer: BLUE CROSS/BLUE SHIELD | Admitting: Family Medicine

## 2015-03-17 VITALS — BP 110/70 | HR 76 | Temp 98.5°F | Resp 16 | Ht 60.5 in | Wt 173.2 lb

## 2015-03-17 DIAGNOSIS — Z130 Encounter for screening for diseases of the blood and blood-forming organs and certain disorders involving the immune mechanism: Secondary | ICD-10-CM

## 2015-03-17 DIAGNOSIS — Z131 Encounter for screening for diabetes mellitus: Secondary | ICD-10-CM

## 2015-03-17 DIAGNOSIS — Z0000 Encounter for general adult medical examination without abnormal findings: Secondary | ICD-10-CM

## 2015-03-17 DIAGNOSIS — Z13220 Encounter for screening for lipoid disorders: Secondary | ICD-10-CM | POA: Diagnosis not present

## 2015-03-17 DIAGNOSIS — Z124 Encounter for screening for malignant neoplasm of cervix: Secondary | ICD-10-CM

## 2015-03-17 DIAGNOSIS — I10 Essential (primary) hypertension: Secondary | ICD-10-CM

## 2015-03-17 DIAGNOSIS — E041 Nontoxic single thyroid nodule: Secondary | ICD-10-CM | POA: Diagnosis not present

## 2015-03-17 DIAGNOSIS — Z23 Encounter for immunization: Secondary | ICD-10-CM | POA: Diagnosis not present

## 2015-03-17 LAB — LIPID PANEL
Cholesterol: 170 mg/dL (ref 0–200)
HDL: 64 mg/dL (ref >=46)
LDL Cholesterol: 86 mg/dL (ref 0–99)
Total CHOL/HDL Ratio: 2.7 Ratio
Triglycerides: 98 mg/dL (ref <150)
VLDL: 20 mg/dL (ref 0–40)

## 2015-03-17 LAB — COMPREHENSIVE METABOLIC PANEL
ALT: 29 U/L (ref 0–35)
AST: 23 U/L (ref 0–37)
Albumin: 4 g/dL (ref 3.5–5.2)
Alkaline Phosphatase: 80 U/L (ref 39–117)
BUN: 14 mg/dL (ref 6–23)
CO2: 29 mEq/L (ref 19–32)
Calcium: 9.6 mg/dL (ref 8.4–10.5)
Chloride: 105 mEq/L (ref 96–112)
Creat: 0.79 mg/dL (ref 0.50–1.10)
Glucose, Bld: 93 mg/dL (ref 70–99)
Potassium: 4.5 mEq/L (ref 3.5–5.3)
Sodium: 143 mEq/L (ref 135–145)
Total Bilirubin: 0.3 mg/dL (ref 0.2–1.2)
Total Protein: 6.4 g/dL (ref 6.0–8.3)

## 2015-03-17 LAB — CBC
HCT: 40 % (ref 36.0–46.0)
Hemoglobin: 13.4 g/dL (ref 12.0–15.0)
MCH: 28.2 pg (ref 26.0–34.0)
MCHC: 33.5 g/dL (ref 30.0–36.0)
MCV: 84.2 fL (ref 78.0–100.0)
MPV: 9.5 fL (ref 8.6–12.4)
Platelets: 404 10*3/uL — ABNORMAL HIGH (ref 150–400)
RBC: 4.75 MIL/uL (ref 3.87–5.11)
RDW: 14.6 % (ref 11.5–15.5)
WBC: 7.4 10*3/uL (ref 4.0–10.5)

## 2015-03-17 LAB — HEMOGLOBIN A1C
Hgb A1c MFr Bld: 6.3 % — ABNORMAL HIGH (ref <5.7)
Mean Plasma Glucose: 134 mg/dL — ABNORMAL HIGH (ref <117)

## 2015-03-17 LAB — POCT URINALYSIS DIPSTICK
Bilirubin, UA: NEGATIVE
Blood, UA: NEGATIVE
Glucose, UA: NEGATIVE
Ketones, UA: NEGATIVE
Nitrite, UA: NEGATIVE
Protein, UA: NEGATIVE
Spec Grav, UA: 1.015
Urobilinogen, UA: 0.2
pH, UA: 7

## 2015-03-17 LAB — TSH: TSH: 0.348 u[IU]/mL — ABNORMAL LOW (ref 0.350–4.500)

## 2015-03-17 NOTE — Patient Instructions (Addendum)
Great to see you today as always!  You will be done with pneumonia vaccination today.  Assuming your pap is normal we can also stop doing paps if you like  Let us know when you need more BP medication
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
## 2015-03-19 ENCOUNTER — Encounter: Payer: Self-pay | Admitting: Family Medicine

## 2015-03-19 LAB — PAP IG AND HPV HIGH-RISK: HPV DNA High Risk: NOT DETECTED

## 2015-03-20 ENCOUNTER — Encounter: Payer: Self-pay | Admitting: Family Medicine

## 2015-03-20 LAB — T3, FREE: T3, Free: 3.2 pg/mL (ref 2.3–4.2)

## 2015-03-20 NOTE — Addendum Note (Signed)
Addended by: Constance Goltz on: 03/20/2015 10:52 AM   Modules accepted: Miquel Dunn

## 2015-03-27 ENCOUNTER — Other Ambulatory Visit: Payer: Self-pay | Admitting: Family Medicine

## 2015-03-30 ENCOUNTER — Other Ambulatory Visit: Payer: Self-pay | Admitting: Family Medicine

## 2015-05-19 ENCOUNTER — Other Ambulatory Visit: Payer: Self-pay | Admitting: Physician Assistant

## 2015-05-19 ENCOUNTER — Other Ambulatory Visit: Payer: Self-pay | Admitting: Internal Medicine

## 2015-05-20 NOTE — Telephone Encounter (Signed)
Please advise on refill.

## 2015-06-16 ENCOUNTER — Ambulatory Visit (INDEPENDENT_AMBULATORY_CARE_PROVIDER_SITE_OTHER): Payer: BLUE CROSS/BLUE SHIELD | Admitting: Family Medicine

## 2015-06-16 VITALS — BP 125/72 | HR 71 | Temp 98.4°F | Resp 16 | Ht 60.5 in | Wt 168.6 lb

## 2015-06-16 DIAGNOSIS — R7309 Other abnormal glucose: Secondary | ICD-10-CM | POA: Diagnosis not present

## 2015-06-16 DIAGNOSIS — Z131 Encounter for screening for diabetes mellitus: Secondary | ICD-10-CM | POA: Diagnosis not present

## 2015-06-16 DIAGNOSIS — R7303 Prediabetes: Secondary | ICD-10-CM | POA: Insufficient documentation

## 2015-06-16 LAB — POCT GLYCOSYLATED HEMOGLOBIN (HGB A1C): Hemoglobin A1C: 6.2

## 2015-06-16 MED ORDER — METFORMIN HCL 500 MG PO TABS: 500.0000 mg | ORAL_TABLET | Freq: Two times a day (BID) | ORAL | Status: DC

## 2015-06-16 NOTE — Patient Instructions (Signed)
Great job on weight loss!  Keep up the good work Your A1c shows some improvement today Start on the metformin 500 once a day in the evening.  If this goes well you can increase to twice a day after a couple of weeks Please plan to see me in about 6 months to check on your progress
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
## 2015-08-29 ENCOUNTER — Other Ambulatory Visit: Payer: Self-pay | Admitting: Family Medicine

## 2015-09-02 ENCOUNTER — Other Ambulatory Visit: Payer: Self-pay | Admitting: Physician Assistant

## 2015-09-15 ENCOUNTER — Ambulatory Visit: Payer: BLUE CROSS/BLUE SHIELD | Admitting: Family Medicine

## 2015-09-17 ENCOUNTER — Encounter: Payer: Self-pay | Admitting: Family Medicine

## 2015-09-17 ENCOUNTER — Ambulatory Visit: Payer: BLUE CROSS/BLUE SHIELD | Admitting: Family Medicine

## 2015-09-17 ENCOUNTER — Ambulatory Visit (INDEPENDENT_AMBULATORY_CARE_PROVIDER_SITE_OTHER): Payer: BLUE CROSS/BLUE SHIELD | Admitting: Family Medicine

## 2015-09-17 VITALS — BP 110/69 | HR 78 | Temp 98.3°F | Resp 16 | Ht 60.5 in | Wt 170.0 lb

## 2015-09-17 DIAGNOSIS — Z130 Encounter for screening for diseases of the blood and blood-forming organs and certain disorders involving the immune mechanism: Secondary | ICD-10-CM | POA: Diagnosis not present

## 2015-09-17 DIAGNOSIS — R7989 Other specified abnormal findings of blood chemistry: Secondary | ICD-10-CM

## 2015-09-17 DIAGNOSIS — R7309 Other abnormal glucose: Secondary | ICD-10-CM | POA: Diagnosis not present

## 2015-09-17 DIAGNOSIS — R7303 Prediabetes: Secondary | ICD-10-CM

## 2015-09-17 DIAGNOSIS — Z119 Encounter for screening for infectious and parasitic diseases, unspecified: Secondary | ICD-10-CM | POA: Diagnosis not present

## 2015-09-17 DIAGNOSIS — Z23 Encounter for immunization: Secondary | ICD-10-CM

## 2015-09-17 LAB — BASIC METABOLIC PANEL
BUN: 12 mg/dL (ref 7–25)
CO2: 27 mmol/L (ref 20–31)
Calcium: 9.1 mg/dL (ref 8.6–10.4)
Chloride: 104 mmol/L (ref 98–110)
Creat: 0.88 mg/dL (ref 0.50–0.99)
Glucose, Bld: 85 mg/dL (ref 65–99)
Potassium: 4 mmol/L (ref 3.5–5.3)
Sodium: 140 mmol/L (ref 135–146)

## 2015-09-17 LAB — CBC
HCT: 37.5 % (ref 36.0–46.0)
Hemoglobin: 12.1 g/dL (ref 12.0–15.0)
MCH: 25.4 pg — ABNORMAL LOW (ref 26.0–34.0)
MCHC: 32.3 g/dL (ref 30.0–36.0)
MCV: 78.6 fL (ref 78.0–100.0)
MPV: 9.3 fL (ref 8.6–12.4)
Platelets: 411 10*3/uL — ABNORMAL HIGH (ref 150–400)
RBC: 4.77 MIL/uL (ref 3.87–5.11)
RDW: 21.3 % — ABNORMAL HIGH (ref 11.5–15.5)
WBC: 8 10*3/uL (ref 4.0–10.5)

## 2015-09-17 LAB — TSH: TSH: 0.518 u[IU]/mL (ref 0.350–4.500)

## 2015-09-17 NOTE — Progress Notes (Signed)
[facility] [address], [place] [address] [phone]  Date:  09/17/2015   Name:  Kristina Mann   DOB:  07/06/1946   MRN:  [id number]  PCP:  Lamar Blinks, MD    Chief Complaint: Follow-up; Diabetes; and Sarcoidosis   History of Present Illness:  Kristina Mann is a 69 y.o. very pleasant female patient who presents with the following:  Last seen about 6 months ago and noted to have pre-diabetes, then had a repeat A1c in June- 6.2%.  We started metformin in June- she has noted some GI effects and will have some diarrhea every few days. She is taking it once daily in the evening.  This SE is tolerable for her  Her CHL looked great back in March Her sarcoidosis is stable- she continues to use adviar She will soon retire- in January.  She will then be on medicare.  Her husband has been retired for a while now. She is looking forward to her retirement!   She has 7 grandchildren and is excited to be able to spend more time with them  BP Readings from Last 3 Encounters:  09/17/15 110/69  06/16/15 125/72  03/17/15 110/70     Wt Readings from Last 3 Encounters:  09/17/15 170 lb (77.111 kg)  06/16/15 168 lb 9.6 oz (76.476 kg)  03/17/15 173 lb 3.2 oz (78.563 kg)     Patient Active Problem List   Diagnosis Date Noted  . Pre-diabetes 06/16/2015  . Low back pain 11/20/2013  . Seasonal allergies 01/20/2012  . Thyroid nodule 01/20/2012  . Sarcoidosis 01/20/2012  . HTN (hypertension) 01/20/2012  . Basal cell cancer 01/20/2012    Past Medical History  Diagnosis Date  . Hypertension   . Thyroid dysfunction      NODULE ABN CELLS 2006  . Bronchitis   . Chicken pox   . Sarcoidosis   . Seasonal allergies   . Mass 2009    RETROPERITONEAL/PERINEPHRIC  . Allergy   . GERD (gastroesophageal reflux disease)   . Basal cell cancer 06/2005    Patient denies any cancer    Past Surgical History  Procedure Laterality Date  . Abdominal hysterectomy  1992   . Gallbladder surgery  1978  . Tonsillectomy and adenoidectomy  1953  . Appendectomy  1978  . Tumor on kidney  2011  . Carpal tunnel release Left 1979  . Tubal ligation  1983  . Tibia fracture surgery Left 2009    had 6 surgeries to repair and debride; and skin graft    Social History  Substance Use Topics  . Smoking status: Never Smoker   . Smokeless tobacco: Never Used  . Alcohol Use: 0.0 - 0.6 oz/week    0-1 Glasses of wine per week    Family History  Problem Relation Age of Onset  . Diabetes Mother   . Cancer Mother     BREAST, THYROID  . Dementia Mother   . Heart disease Father     MI X3  . Cancer Father   . COPD Father   . Hypertension    . Hypertension Sister   . Nephrolithiasis Sister   . Hypertension Brother   . Colon cancer Neg Hx     Allergies  Allergen Reactions  . Codeine Nausea And Vomiting    Medication list has been reviewed and updated.  Current Outpatient Prescriptions on File Prior to Visit  Medication Sig Dispense Refill  . aspirin 81 MG chewable tablet  Chew 81 mg by mouth daily.    . calcium-vitamin D 250-100 MG-UNIT per tablet Take 1 tablet by mouth 2 (two) times daily.    . chlorthalidone (HYGROTON) 25 MG tablet TAKE 1/2 TABLET BY MOUTH EVERY DAY FOR BLOOD PRESSURE 45 tablet 0  . Esomeprazole Magnesium (NEXIUM PO) Take 40 mg by mouth 2 (two) times daily.     . fexofenadine-pseudoephedrine (ALLEGRA-D 24) 180-240 MG per 24 hr tablet Take 1 tablet by mouth daily.    . fluticasone (FLONASE) 50 MCG/ACT nasal spray USE 2 SPRAYS IN EACH       NOSTRIL DAILY 48 g 3  . Fluticasone-Salmeterol (ADVAIR DISKUS) 250-50 MCG/DOSE AEPB Inhale 1 puff by mouth twice daily 1 each 0  . KRILL OIL PO Take by mouth daily.    Marland Kitchen lisinopril (PRINIVIL,ZESTRIL) 5 MG tablet TAKE 1 TABLET (5 MG) BY MOUTH DAILY 30 tablet 0  . metFORMIN (GLUCOPHAGE) 500 MG tablet Take 1 tablet (500 mg total) by mouth 2 (two) times daily with a meal. 180 tablet 3  . Multiple Vitamin  (MULTIVITAMIN) capsule Take 1 capsule by mouth daily.    Marland Kitchen OVER THE COUNTER MEDICATION Biotin skin, hair, nails taking    . OVER THE COUNTER MEDICATION OTC Co Q10 taking    . OVER THE COUNTER MEDICATION Glucosamine Chondriotin taking    . OVER THE COUNTER MEDICATION Krill Oil taking    . OVER THE COUNTER MEDICATION Garlic tablets taking     No current facility-administered medications on file prior to visit.    Review of Systems:  As per HPI- otherwise negative.   Physical Examination: Filed Vitals:   09/17/15 1607  BP: 110/69  Pulse: 78  Temp: 98.3 F (36.8 C)  Resp: 16   Filed Vitals:   09/17/15 1607  Height: 5' 0.5" (1.537 m)  Weight: 170 lb (77.111 kg)   Body mass index is 32.64 kg/(m^2). Ideal Body Weight: Weight in (lb) to have BMI = 25: 129.9  GEN: WDWN, NAD, Non-toxic, A & O x 3, looks well,  HEENT: Atraumatic, Normocephalic. Neck supple. No masses, No LAD. Ears and Nose: No external deformity. CV: RRR, No M/G/R. No JVD. No thrill. No extra heart sounds. PULM: CTA B, no wheezes, crackles, rhonchi. No retractions. No resp. distress. No accessory muscle use. ABD: S, NT, ND, +BS. No rebound. No HSM. EXTR: No c/c/e NEURO Normal gait.  PSYCH: Normally interactive. Conversant. Not depressed or anxious appearing.  Calm demeanor.  Normal foot exam bilaterally    Assessment and Plan: Pre-diabetes - Plan: Basic metabolic panel, Hemoglobin A1c  Need for prophylactic vaccination and inoculation against influenza - Plan: Flu Vaccine QUAD 36+ mos IM  Screening examination for infectious disease - Plan: Hepatitis C antibody  Abnormal TSH - Plan: TSH  Screening for deficiency anemia - Plan: CBC   Repeat labs for her today as above Her sarcoid is stable and asymptomatic Flu shot today Signed Lamar Blinks, MD

## 2015-09-17 NOTE — Patient Instructions (Signed)
I will be in touch with your labs asap Kristina Mann to see you today and congratulations in advance on your retirement!

## 2015-09-18 ENCOUNTER — Encounter: Payer: Self-pay | Admitting: Family Medicine

## 2015-09-18 LAB — HEMOGLOBIN A1C
Hgb A1c MFr Bld: 6 % — ABNORMAL HIGH (ref <5.7)
Mean Plasma Glucose: 126 mg/dL — ABNORMAL HIGH (ref <117)

## 2015-09-18 LAB — HEPATITIS C ANTIBODY: HCV Ab: NEGATIVE

## 2015-09-26 ENCOUNTER — Other Ambulatory Visit: Payer: Self-pay | Admitting: Physician Assistant

## 2015-10-18 ENCOUNTER — Other Ambulatory Visit: Payer: Self-pay | Admitting: Family Medicine

## 2015-10-20 NOTE — Telephone Encounter (Signed)
OK to RF

## 2015-12-28 ENCOUNTER — Other Ambulatory Visit: Payer: Self-pay | Admitting: Family Medicine

## 2016-01-03 ENCOUNTER — Other Ambulatory Visit: Payer: Self-pay | Admitting: Physician Assistant

## 2016-01-16 ENCOUNTER — Encounter: Payer: Self-pay | Admitting: Family Medicine

## 2016-01-21 ENCOUNTER — Encounter: Payer: Self-pay | Admitting: Family Medicine

## 2016-02-25 ENCOUNTER — Ambulatory Visit (INDEPENDENT_AMBULATORY_CARE_PROVIDER_SITE_OTHER): Payer: Medicare Other | Admitting: Family Medicine

## 2016-02-25 ENCOUNTER — Encounter: Payer: Self-pay | Admitting: Family Medicine

## 2016-02-25 VITALS — BP 110/72 | HR 70 | Temp 98.6°F | Ht 60.5 in | Wt 174.0 lb

## 2016-02-25 DIAGNOSIS — J0111 Acute recurrent frontal sinusitis: Secondary | ICD-10-CM

## 2016-02-25 DIAGNOSIS — R7309 Other abnormal glucose: Secondary | ICD-10-CM | POA: Diagnosis not present

## 2016-02-25 DIAGNOSIS — R7303 Prediabetes: Secondary | ICD-10-CM

## 2016-02-25 MED ORDER — PREDNISONE 20 MG PO TABS: ORAL_TABLET | ORAL | Status: DC

## 2016-02-25 MED ORDER — AMOXICILLIN 500 MG PO CAPS: 1000.0000 mg | ORAL_CAPSULE | Freq: Two times a day (BID) | ORAL | Status: DC
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
## 2016-02-25 NOTE — Patient Instructions (Signed)
You likely have allergies causing your sinus pain Start on the prednisone- remember this will make you more hungry and thirsty. And can raise your blood sugar. Watch your carbs and sweets while on this medication If you are not improved in 1-2 days you can start the amoxicillin rx also

## 2016-02-26 LAB — BASIC METABOLIC PANEL
BUN: 15 mg/dL (ref 6–23)
CO2: 29 mEq/L (ref 19–32)
Calcium: 9.3 mg/dL (ref 8.4–10.5)
Chloride: 103 mEq/L (ref 96–112)
Creatinine, Ser: 0.96 mg/dL (ref 0.40–1.20)
GFR: 61.39 mL/min (ref >60.00)
Glucose, Bld: 84 mg/dL (ref 70–99)
Potassium: 3.9 mEq/L (ref 3.5–5.1)
Sodium: 139 mEq/L (ref 135–145)

## 2016-02-26 LAB — HEMOGLOBIN A1C: Hgb A1c MFr Bld: 6.1 % (ref 4.6–6.5)

## 2016-02-28 ENCOUNTER — Encounter: Payer: Self-pay | Admitting: Family Medicine

## 2016-03-15 DIAGNOSIS — Z1231 Encounter for screening mammogram for malignant neoplasm of breast: Secondary | ICD-10-CM | POA: Diagnosis not present

## 2016-03-15 DIAGNOSIS — Z803 Family history of malignant neoplasm of breast: Secondary | ICD-10-CM | POA: Diagnosis not present

## 2016-03-15 LAB — HM MAMMOGRAPHY

## 2016-03-17 ENCOUNTER — Ambulatory Visit: Payer: BLUE CROSS/BLUE SHIELD | Admitting: Family Medicine

## 2016-03-17 ENCOUNTER — Ambulatory Visit: Payer: Self-pay | Admitting: Family Medicine

## 2016-03-17 ENCOUNTER — Encounter: Payer: Self-pay | Admitting: Family Medicine

## 2016-03-17 ENCOUNTER — Ambulatory Visit (INDEPENDENT_AMBULATORY_CARE_PROVIDER_SITE_OTHER): Payer: Medicare Other | Admitting: Family Medicine

## 2016-03-17 ENCOUNTER — Ambulatory Visit (HOSPITAL_BASED_OUTPATIENT_CLINIC_OR_DEPARTMENT_OTHER)
Admission: RE | Admit: 2016-03-17 | Discharge: 2016-03-17 | Disposition: A | Discharge status: Home / Self Care | Payer: Medicare Other | Source: Ambulatory Visit | Attending: Family Medicine | Admitting: Family Medicine

## 2016-03-17 VITALS — BP 118/60 | HR 86 | Ht 60.5 in | Wt 174.6 lb

## 2016-03-17 DIAGNOSIS — Z13220 Encounter for screening for lipoid disorders: Secondary | ICD-10-CM

## 2016-03-17 DIAGNOSIS — M25572 Pain in left ankle and joints of left foot: Secondary | ICD-10-CM

## 2016-03-17 DIAGNOSIS — Z5181 Encounter for therapeutic drug level monitoring: Secondary | ICD-10-CM | POA: Diagnosis not present

## 2016-03-17 DIAGNOSIS — Z23 Encounter for immunization: Secondary | ICD-10-CM | POA: Diagnosis not present

## 2016-03-17 DIAGNOSIS — M19072 Primary osteoarthritis, left ankle and foot: Secondary | ICD-10-CM | POA: Diagnosis not present

## 2016-03-17 DIAGNOSIS — E669 Obesity, unspecified: Secondary | ICD-10-CM

## 2016-03-17 DIAGNOSIS — R7303 Prediabetes: Secondary | ICD-10-CM

## 2016-03-17 DIAGNOSIS — Z130 Encounter for screening for diseases of the blood and blood-forming organs and certain disorders involving the immune mechanism: Secondary | ICD-10-CM

## 2016-03-17 NOTE — Progress Notes (Signed)
Pre visit review using our clinic review tool, if applicable. No additional management support is needed unless otherwise documented below in the visit note. 

## 2016-03-17 NOTE — Patient Instructions (Signed)
Please go downstairs for your x-rays, then you can head home I will send you the reports on mychart asap  Please make a lab appointment at your convenience in the next few months and come in for fasting labs
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
## 2016-03-18 ENCOUNTER — Other Ambulatory Visit (INDEPENDENT_AMBULATORY_CARE_PROVIDER_SITE_OTHER): Payer: Medicare Other

## 2016-03-18 DIAGNOSIS — Z23 Encounter for immunization: Secondary | ICD-10-CM

## 2016-03-18 DIAGNOSIS — Z5181 Encounter for therapeutic drug level monitoring: Secondary | ICD-10-CM | POA: Diagnosis not present

## 2016-03-18 DIAGNOSIS — Z130 Encounter for screening for diseases of the blood and blood-forming organs and certain disorders involving the immune mechanism: Secondary | ICD-10-CM | POA: Diagnosis not present

## 2016-03-18 DIAGNOSIS — R7303 Prediabetes: Secondary | ICD-10-CM

## 2016-03-18 DIAGNOSIS — Z13220 Encounter for screening for lipoid disorders: Secondary | ICD-10-CM | POA: Diagnosis not present

## 2016-03-18 DIAGNOSIS — E669 Obesity, unspecified: Secondary | ICD-10-CM

## 2016-03-18 LAB — LIPID PANEL
Cholesterol: 196 mg/dL (ref 0–200)
HDL: 67.8 mg/dL (ref >39.00)
LDL Cholesterol: 108 mg/dL — ABNORMAL HIGH (ref 0–99)
NonHDL: 128.66
Total CHOL/HDL Ratio: 3
Triglycerides: 102 mg/dL (ref 0.0–149.0)
VLDL: 20.4 mg/dL (ref 0.0–40.0)

## 2016-03-18 LAB — CBC
HCT: 40.9 % (ref 36.0–46.0)
Hemoglobin: 13.3 g/dL (ref 12.0–15.0)
MCHC: 32.6 g/dL (ref 30.0–36.0)
MCV: 84.3 fl (ref 78.0–100.0)
Platelets: 381 10*3/uL (ref 150.0–400.0)
RBC: 4.85 Mil/uL (ref 3.87–5.11)
RDW: 15.7 % — ABNORMAL HIGH (ref 11.5–15.5)
WBC: 6.9 10*3/uL (ref 4.0–10.5)

## 2016-03-18 LAB — COMPREHENSIVE METABOLIC PANEL
ALT: 25 U/L (ref 0–35)
AST: 21 U/L (ref 0–37)
Albumin: 4.1 g/dL (ref 3.5–5.2)
Alkaline Phosphatase: 67 U/L (ref 39–117)
BUN: 17 mg/dL (ref 6–23)
CO2: 30 mEq/L (ref 19–32)
Calcium: 9.4 mg/dL (ref 8.4–10.5)
Chloride: 102 mEq/L (ref 96–112)
Creatinine, Ser: 0.88 mg/dL (ref 0.40–1.20)
GFR: 67.86 mL/min (ref >60.00)
Glucose, Bld: 95 mg/dL (ref 70–99)
Potassium: 3.4 mEq/L — ABNORMAL LOW (ref 3.5–5.1)
Sodium: 141 mEq/L (ref 135–145)
Total Bilirubin: 0.4 mg/dL (ref 0.2–1.2)
Total Protein: 7.1 g/dL (ref 6.0–8.3)

## 2016-03-22 ENCOUNTER — Other Ambulatory Visit: Payer: Self-pay | Admitting: Family Medicine

## 2016-03-24 ENCOUNTER — Encounter: Payer: Self-pay | Admitting: Family Medicine

## 2016-05-12 DIAGNOSIS — I10 Essential (primary) hypertension: Secondary | ICD-10-CM | POA: Diagnosis not present

## 2016-05-12 DIAGNOSIS — E0590 Thyrotoxicosis, unspecified without thyrotoxic crisis or storm: Secondary | ICD-10-CM | POA: Diagnosis not present

## 2016-05-12 DIAGNOSIS — E042 Nontoxic multinodular goiter: Secondary | ICD-10-CM | POA: Insufficient documentation

## 2016-06-02 DIAGNOSIS — E042 Nontoxic multinodular goiter: Secondary | ICD-10-CM | POA: Diagnosis not present

## 2016-06-02 DIAGNOSIS — E0590 Thyrotoxicosis, unspecified without thyrotoxic crisis or storm: Secondary | ICD-10-CM | POA: Diagnosis not present

## 2016-06-14 ENCOUNTER — Other Ambulatory Visit: Payer: Self-pay | Admitting: Family Medicine

## 2016-07-29 ENCOUNTER — Other Ambulatory Visit: Payer: Self-pay | Admitting: Family Medicine

## 2016-07-29 DIAGNOSIS — R7303 Prediabetes: Secondary | ICD-10-CM

## 2016-07-29 NOTE — Telephone Encounter (Signed)
Refill request received for metFORMIN (GLUCOPHAGE) 500 MG tablet. RX sent to pharmacy.

## 2016-09-08 ENCOUNTER — Other Ambulatory Visit: Payer: Self-pay | Admitting: Emergency Medicine

## 2016-09-08 ENCOUNTER — Other Ambulatory Visit: Payer: Self-pay | Admitting: Family Medicine

## 2016-09-08 MED ORDER — LISINOPRIL 5 MG PO TABS: ORAL_TABLET | ORAL | 1 refills | Status: DC

## 2016-09-08 MED ORDER — CHLORTHALIDONE 25 MG PO TABS: ORAL_TABLET | ORAL | 2 refills | Status: DC

## 2016-09-24 ENCOUNTER — Encounter: Payer: Self-pay | Admitting: Family Medicine

## 2016-09-24 ENCOUNTER — Ambulatory Visit (INDEPENDENT_AMBULATORY_CARE_PROVIDER_SITE_OTHER): Payer: Medicare Other | Admitting: Family Medicine

## 2016-09-24 VITALS — BP 120/70 | HR 84 | Temp 98.3°F | Ht 60.5 in | Wt 171.4 lb

## 2016-09-24 DIAGNOSIS — J040 Acute laryngitis: Secondary | ICD-10-CM | POA: Diagnosis not present

## 2016-09-24 DIAGNOSIS — R05 Cough: Secondary | ICD-10-CM | POA: Diagnosis not present

## 2016-09-24 DIAGNOSIS — R059 Cough, unspecified: Secondary | ICD-10-CM

## 2016-09-24 MED ORDER — BENZONATATE 100 MG PO CAPS: 100.0000 mg | ORAL_CAPSULE | Freq: Three times a day (TID) | ORAL | 0 refills | Status: DC | PRN
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
## 2016-09-24 NOTE — Patient Instructions (Signed)
Claritin (loratadine), Allegra (fexofenadine), Zyrtec (cetirizine); these are listed in order from weakest to strongest. Generic, and therefore cheaper, options are in the parentheses.   There are available OTC, and the generic versions, which may be cheaper, are in parentheses. Show this to a pharmacist if you have trouble finding any of these items.  If you develop new symptoms, come back.

## 2016-09-24 NOTE — Progress Notes (Signed)
Pre visit review using our clinic review tool, if applicable. No additional management support is needed unless otherwise documented below in the visit note. 

## 2016-10-25 ENCOUNTER — Other Ambulatory Visit: Payer: Self-pay | Admitting: Family Medicine

## 2016-10-25 DIAGNOSIS — R7303 Prediabetes: Secondary | ICD-10-CM

## 2016-11-22 DIAGNOSIS — H40053 Ocular hypertension, bilateral: Secondary | ICD-10-CM | POA: Diagnosis not present

## 2016-11-27 ENCOUNTER — Other Ambulatory Visit: Payer: Self-pay | Admitting: Family Medicine

## 2016-12-13 ENCOUNTER — Encounter: Payer: Self-pay | Admitting: Family Medicine

## 2016-12-13 ENCOUNTER — Ambulatory Visit (INDEPENDENT_AMBULATORY_CARE_PROVIDER_SITE_OTHER): Payer: Medicare Other | Admitting: Family Medicine

## 2016-12-13 VITALS — BP 104/63 | HR 82 | Temp 97.9°F | Ht 60.5 in | Wt 169.4 lb

## 2016-12-13 DIAGNOSIS — M25572 Pain in left ankle and joints of left foot: Secondary | ICD-10-CM | POA: Diagnosis not present

## 2016-12-13 DIAGNOSIS — G8929 Other chronic pain: Secondary | ICD-10-CM

## 2016-12-13 DIAGNOSIS — R7303 Prediabetes: Secondary | ICD-10-CM | POA: Diagnosis not present

## 2016-12-13 DIAGNOSIS — Z23 Encounter for immunization: Secondary | ICD-10-CM | POA: Diagnosis not present

## 2016-12-13 DIAGNOSIS — Z85828 Personal history of other malignant neoplasm of skin: Secondary | ICD-10-CM

## 2016-12-13 DIAGNOSIS — J0111 Acute recurrent frontal sinusitis: Secondary | ICD-10-CM

## 2016-12-13 DIAGNOSIS — I10 Essential (primary) hypertension: Secondary | ICD-10-CM

## 2016-12-13 LAB — BASIC METABOLIC PANEL
BUN: 13 mg/dL (ref 6–23)
CO2: 29 mEq/L (ref 19–32)
Calcium: 9.3 mg/dL (ref 8.4–10.5)
Chloride: 102 mEq/L (ref 96–112)
Creatinine, Ser: 0.81 mg/dL (ref 0.40–1.20)
GFR: 74.51 mL/min (ref >60.00)
Glucose, Bld: 101 mg/dL — ABNORMAL HIGH (ref 70–99)
Potassium: 3.9 mEq/L (ref 3.5–5.1)
Sodium: 140 mEq/L (ref 135–145)

## 2016-12-13 LAB — HEMOGLOBIN A1C: Hgb A1c MFr Bld: 5.6 % (ref 4.6–6.5)

## 2016-12-13 MED ORDER — AMOXICILLIN 500 MG PO CAPS: 1000.0000 mg | ORAL_CAPSULE | Freq: Two times a day (BID) | ORAL | 0 refills | Status: DC

## 2016-12-13 NOTE — Patient Instructions (Addendum)
It was very nice to see you today- if you are not able to find a chlorthalidone supplier that you can split please let me know .  Your BP is on the low side today; please keep an eye on this for me. We may need to stop your lisinopril I will be in touch with your labs, and we will refer you to Dr. Denna Haggard to check your skin, and to see Dr. Doran Durand for your ankle You got your flu shot today I gave you a printed rx for amoxicillin- this is an antibiotic for your sinuses. However, at this point I would presume that your symptoms are due to a virus. I would recommend that you hold this rx and not use unless your symptoms fail to resolve over the next several days  Please see me in about 6 months
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
## 2017-01-12 ENCOUNTER — Ambulatory Visit: Payer: PRIVATE HEALTH INSURANCE | Admitting: Family Medicine

## 2017-01-14 ENCOUNTER — Ambulatory Visit (INDEPENDENT_AMBULATORY_CARE_PROVIDER_SITE_OTHER): Payer: Medicare Other | Admitting: Medical

## 2017-01-14 ENCOUNTER — Encounter: Payer: Self-pay | Admitting: Medical

## 2017-01-14 ENCOUNTER — Ambulatory Visit (HOSPITAL_BASED_OUTPATIENT_CLINIC_OR_DEPARTMENT_OTHER)
Admission: RE | Admit: 2017-01-14 | Discharge: 2017-01-14 | Disposition: A | Discharge status: Home / Self Care | Payer: Medicare Other | Source: Ambulatory Visit | Attending: Medical | Admitting: Medical

## 2017-01-14 VITALS — BP 117/56 | HR 79 | Temp 98.0°F | Resp 16 | Ht 60.5 in | Wt 173.4 lb

## 2017-01-14 DIAGNOSIS — M25742 Osteophyte, left hand: Secondary | ICD-10-CM | POA: Diagnosis not present

## 2017-01-14 DIAGNOSIS — M79645 Pain in left finger(s): Secondary | ICD-10-CM | POA: Diagnosis not present

## 2017-01-14 DIAGNOSIS — M189 Osteoarthritis of first carpometacarpal joint, unspecified: Secondary | ICD-10-CM | POA: Diagnosis not present

## 2017-01-14 DIAGNOSIS — M19042 Primary osteoarthritis, left hand: Secondary | ICD-10-CM | POA: Diagnosis not present

## 2017-01-14 DIAGNOSIS — M779 Enthesopathy, unspecified: Secondary | ICD-10-CM | POA: Diagnosis not present

## 2017-01-14 NOTE — Progress Notes (Signed)
Pre visit review using our clinic review tool, if applicable. No additional management support is needed unless otherwise documented below in the visit note/SLS  

## 2017-01-14 NOTE — Patient Instructions (Addendum)
You appear to have tendinitis of the thumb and may be related to working with clay.  Will get xray of thumb today. And will place thumb spica splint. Use daily.  Advise low dose ibuprofen 200 mg every 8 hours for next 3-4 days/until can get in with sports medicine.  If by Monday pain increased would recommend uric acid level.  Will put in referral to sports med and see if they can see you on Monday.   Follow up 7-10 days or as needed
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
## 2017-01-17 ENCOUNTER — Encounter: Payer: Self-pay | Admitting: Family Medicine

## 2017-01-17 ENCOUNTER — Ambulatory Visit (INDEPENDENT_AMBULATORY_CARE_PROVIDER_SITE_OTHER): Payer: Medicare Other | Admitting: Family Medicine

## 2017-01-17 VITALS — BP 120/78 | HR 99 | Ht 61.0 in | Wt 174.0 lb

## 2017-01-17 DIAGNOSIS — M6530 Trigger finger, unspecified finger: Secondary | ICD-10-CM | POA: Diagnosis not present

## 2017-01-17 DIAGNOSIS — M65312 Trigger thumb, left thumb: Secondary | ICD-10-CM

## 2017-01-17 MED ORDER — METHYLPREDNISOLONE ACETATE 40 MG/ML IJ SUSP
20.0000 mg | Freq: Once | INTRAMUSCULAR | Status: AC
  Administered 2017-01-17: 20 mg via INTRA_ARTICULAR

## 2017-01-17 NOTE — Patient Instructions (Signed)
You have a trigger thumb. You were given an injection today. Wear the splint for another week then can discontinue this. Tylenol, aleve only if needed for pain. Follow up with me in 1 month or as needed.

## 2017-01-18 DIAGNOSIS — M6530 Trigger finger, unspecified finger: Secondary | ICD-10-CM | POA: Insufficient documentation
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
## 2017-01-18 NOTE — Assessment & Plan Note (Signed)
discussed options - went ahead with injection today.  Will continue with thumb spica splint for another week.  Tylenol, aleve only if needed.  F/u in 1 month or prn.  After informed written consent patient was seated in chair in exam room.  Area overlying left 1st digit A1 pulley prepped with alcohol swab then injected with 0.5:0.22mL bupivicaine:depomedrol.  Patient tolerated procedure well without immediate complications.

## 2017-01-31 DIAGNOSIS — Z9689 Presence of other specified functional implants: Secondary | ICD-10-CM | POA: Diagnosis not present

## 2017-01-31 DIAGNOSIS — M19172 Post-traumatic osteoarthritis, left ankle and foot: Secondary | ICD-10-CM | POA: Diagnosis not present

## 2017-02-01 DIAGNOSIS — Z85828 Personal history of other malignant neoplasm of skin: Secondary | ICD-10-CM | POA: Diagnosis not present

## 2017-02-01 DIAGNOSIS — D229 Melanocytic nevi, unspecified: Secondary | ICD-10-CM | POA: Diagnosis not present

## 2017-02-01 DIAGNOSIS — L570 Actinic keratosis: Secondary | ICD-10-CM | POA: Diagnosis not present

## 2017-02-01 DIAGNOSIS — L821 Other seborrheic keratosis: Secondary | ICD-10-CM | POA: Diagnosis not present

## 2017-03-22 ENCOUNTER — Encounter: Payer: Self-pay | Admitting: Family Medicine

## 2017-03-22 DIAGNOSIS — Z1231 Encounter for screening mammogram for malignant neoplasm of breast: Secondary | ICD-10-CM | POA: Diagnosis not present

## 2017-03-22 DIAGNOSIS — Z803 Family history of malignant neoplasm of breast: Secondary | ICD-10-CM | POA: Diagnosis not present

## 2017-03-22 DIAGNOSIS — M8589 Other specified disorders of bone density and structure, multiple sites: Secondary | ICD-10-CM | POA: Diagnosis not present

## 2017-03-30 ENCOUNTER — Encounter: Payer: Self-pay | Admitting: Family Medicine

## 2017-03-30 DIAGNOSIS — M8580 Other specified disorders of bone density and structure, unspecified site: Secondary | ICD-10-CM | POA: Insufficient documentation

## 2017-05-09 ENCOUNTER — Other Ambulatory Visit: Payer: Self-pay

## 2017-05-09 MED ORDER — FLUTICASONE-SALMETEROL 250-50 MCG/DOSE IN AEPB: 1.0000 | INHALATION_SPRAY | Freq: Two times a day (BID) | RESPIRATORY_TRACT | 1 refills | Status: DC

## 2017-05-09 MED ORDER — LISINOPRIL 5 MG PO TABS: ORAL_TABLET | ORAL | 1 refills | Status: DC

## 2017-05-11 IMAGING — DX DG FINGER THUMB 2+V*L*
3 series · 3 of 3 positions shown · non-contrast
Comparison: None.

CLINICAL DATA: Soft tissue swelling and tendinitis of the thumb
over the last week.

EXAM:
LEFT THUMB 2+V

[finger ap]
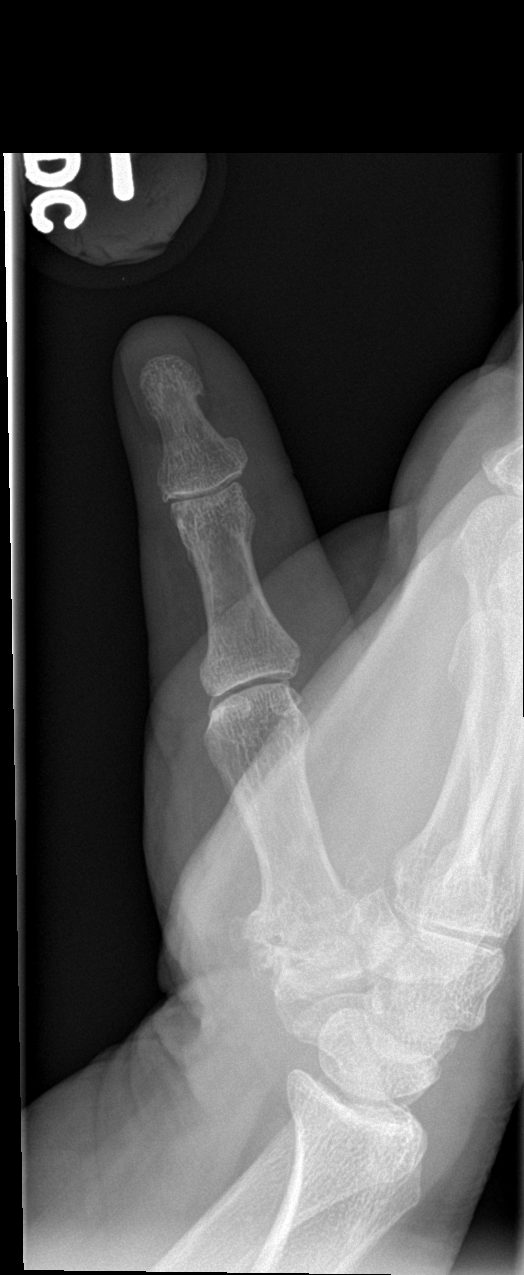

[finger obl]
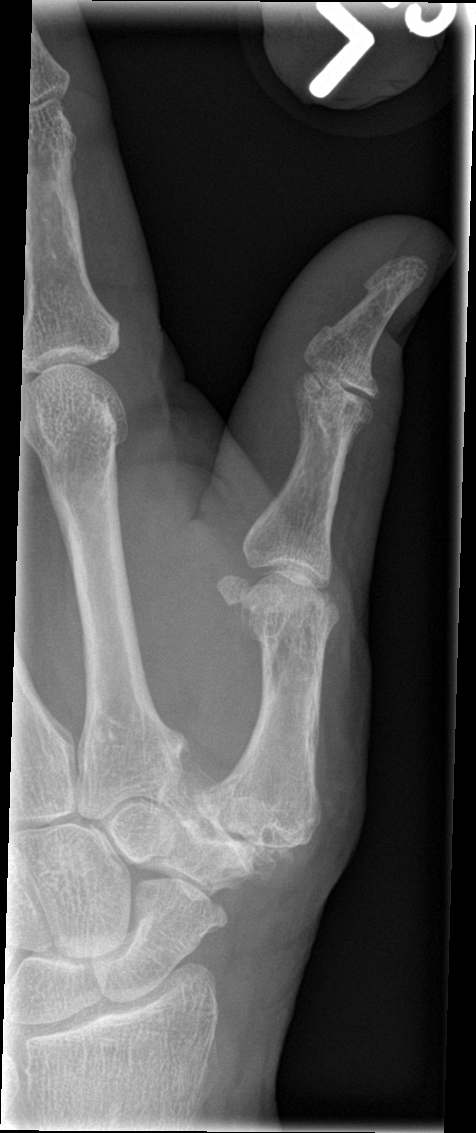

[finger lat]
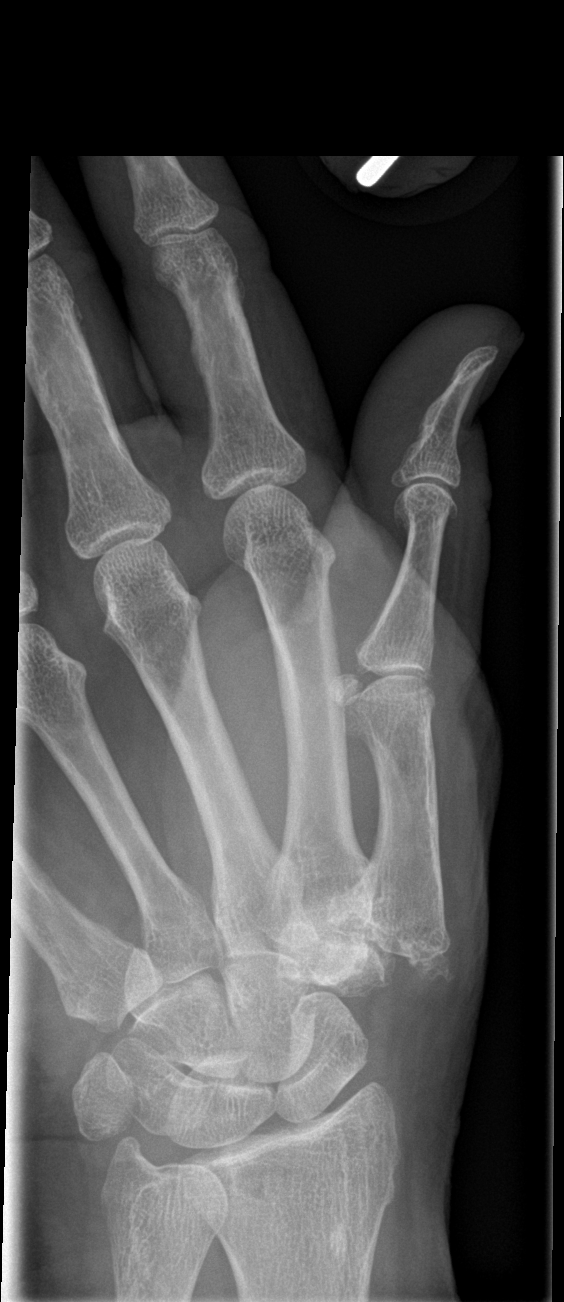

[3 of 3 positions shown; findings below may reference images not displayed]

FINDINGS: There is pronounced osteoarthritis at the first carpal/metacarpal
joint with joint space narrowing, osteophyte formation and
intraosseous cyst formation. There could be calcified loose bodies
as well. Mild osteoarthritis of the MCP joint and the inter
phalangeal joint.
IMPRESSION: Advanced osteoarthritis of the first carpometacarpal joint, with
lesser degenerative changes of the MCP joint and IP joint as well.

## 2017-05-12 DIAGNOSIS — E0590 Thyrotoxicosis, unspecified without thyrotoxic crisis or storm: Secondary | ICD-10-CM | POA: Diagnosis not present

## 2017-05-12 DIAGNOSIS — I10 Essential (primary) hypertension: Secondary | ICD-10-CM | POA: Diagnosis not present

## 2017-05-12 DIAGNOSIS — E042 Nontoxic multinodular goiter: Secondary | ICD-10-CM | POA: Diagnosis not present

## 2017-06-08 ENCOUNTER — Encounter: Payer: Self-pay | Admitting: Family Medicine

## 2017-06-08 ENCOUNTER — Ambulatory Visit (INDEPENDENT_AMBULATORY_CARE_PROVIDER_SITE_OTHER): Payer: Medicare Other | Admitting: Family Medicine

## 2017-06-08 VITALS — BP 128/72 | HR 71 | Temp 98.1°F | Ht 61.0 in | Wt 172.0 lb

## 2017-06-08 DIAGNOSIS — M8580 Other specified disorders of bone density and structure, unspecified site: Secondary | ICD-10-CM | POA: Diagnosis not present

## 2017-06-08 DIAGNOSIS — G8929 Other chronic pain: Secondary | ICD-10-CM

## 2017-06-08 DIAGNOSIS — E785 Hyperlipidemia, unspecified: Secondary | ICD-10-CM

## 2017-06-08 DIAGNOSIS — R7303 Prediabetes: Secondary | ICD-10-CM

## 2017-06-08 DIAGNOSIS — M2550 Pain in unspecified joint: Secondary | ICD-10-CM | POA: Diagnosis not present

## 2017-06-08 DIAGNOSIS — Z5181 Encounter for therapeutic drug level monitoring: Secondary | ICD-10-CM

## 2017-06-08 DIAGNOSIS — D869 Sarcoidosis, unspecified: Secondary | ICD-10-CM

## 2017-06-08 DIAGNOSIS — I10 Essential (primary) hypertension: Secondary | ICD-10-CM

## 2017-06-08 DIAGNOSIS — M25572 Pain in left ankle and joints of left foot: Secondary | ICD-10-CM | POA: Diagnosis not present

## 2017-06-08 DIAGNOSIS — Z130 Encounter for screening for diseases of the blood and blood-forming organs and certain disorders involving the immune mechanism: Secondary | ICD-10-CM | POA: Diagnosis not present

## 2017-06-08 LAB — COMPREHENSIVE METABOLIC PANEL
ALT: 23 U/L (ref 0–35)
AST: 21 U/L (ref 0–37)
Albumin: 4.2 g/dL (ref 3.5–5.2)
Alkaline Phosphatase: 70 U/L (ref 39–117)
BUN: 14 mg/dL (ref 6–23)
CO2: 30 mEq/L (ref 19–32)
Calcium: 9.5 mg/dL (ref 8.4–10.5)
Chloride: 104 mEq/L (ref 96–112)
Creatinine, Ser: 0.74 mg/dL (ref 0.40–1.20)
GFR: 82.58 mL/min (ref >60.00)
Glucose, Bld: 103 mg/dL — ABNORMAL HIGH (ref 70–99)
Potassium: 3.6 mEq/L (ref 3.5–5.1)
Sodium: 140 mEq/L (ref 135–145)
Total Bilirubin: 0.4 mg/dL (ref 0.2–1.2)
Total Protein: 6.8 g/dL (ref 6.0–8.3)

## 2017-06-08 LAB — LIPID PANEL
Cholesterol: 191 mg/dL (ref 0–200)
HDL: 55.5 mg/dL (ref >39.00)
LDL Cholesterol: 110 mg/dL — ABNORMAL HIGH (ref 0–99)
NonHDL: 135.6
Total CHOL/HDL Ratio: 3
Triglycerides: 130 mg/dL (ref 0.0–149.0)
VLDL: 26 mg/dL (ref 0.0–40.0)

## 2017-06-08 LAB — CBC
HCT: 40.4 % (ref 36.0–46.0)
Hemoglobin: 13.7 g/dL (ref 12.0–15.0)
MCHC: 34 g/dL (ref 30.0–36.0)
MCV: 88 fl (ref 78.0–100.0)
Platelets: 346 10*3/uL (ref 150.0–400.0)
RBC: 4.59 Mil/uL (ref 3.87–5.11)
RDW: 13.5 % (ref 11.5–15.5)
WBC: 6.4 10*3/uL (ref 4.0–10.5)

## 2017-06-08 LAB — C-REACTIVE PROTEIN: CRP: 0.6 mg/dL (ref 0.5–20.0)

## 2017-06-08 LAB — SEDIMENTATION RATE: Sed Rate: 11 mm/hr (ref 0–30)

## 2017-06-08 LAB — HEMOGLOBIN A1C: Hgb A1c MFr Bld: 6 % (ref 4.6–6.5)

## 2017-06-08 LAB — VITAMIN D 25 HYDROXY (VIT D DEFICIENCY, FRACTURES): VITD: 48.15 ng/mL (ref 30.00–100.00)
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
## 2017-06-08 NOTE — Patient Instructions (Addendum)
It was a pleasure to see you in the office as always We will be in touch with your labs asap  I will run some labs today to look for any sign of an autoimmune disorder Your blood pressure looks great, weight is stable  BP Readings from Last 3 Encounters:  06/08/17 128/72  01/17/17 120/78  01/14/17 (!) 117/56   Wt Readings from Last 3 Encounters:  06/08/17 172 lb (78 kg)  01/17/17 174 lb (78.9 kg)  01/14/17 173 lb 6 oz (78.6 kg)

## 2017-06-09 LAB — RHEUMATOID FACTOR: Rhuematoid fact SerPl-aCnc: <14 IU/mL (ref <14)

## 2017-06-09 LAB — ANTI-NUCLEAR AB-TITER (ANA TITER): ANA Titer 1: 1:160 {titer} — ABNORMAL HIGH

## 2017-06-09 LAB — ANA: Anti Nuclear Antibody(ANA): POSITIVE — AB

## 2017-06-10 DIAGNOSIS — E042 Nontoxic multinodular goiter: Secondary | ICD-10-CM | POA: Diagnosis not present

## 2017-06-11 ENCOUNTER — Encounter: Payer: Self-pay | Admitting: Family Medicine

## 2017-06-11 DIAGNOSIS — D869 Sarcoidosis, unspecified: Secondary | ICD-10-CM

## 2017-06-13 ENCOUNTER — Ambulatory Visit: Payer: PRIVATE HEALTH INSURANCE | Admitting: Family Medicine

## 2017-06-24 ENCOUNTER — Other Ambulatory Visit: Payer: Self-pay | Admitting: Family Medicine

## 2017-07-23 ENCOUNTER — Other Ambulatory Visit: Payer: Self-pay | Admitting: Family Medicine
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
## 2017-08-11 ENCOUNTER — Ambulatory Visit (INDEPENDENT_AMBULATORY_CARE_PROVIDER_SITE_OTHER): Payer: Medicare Other | Admitting: Family Medicine

## 2017-08-11 VITALS — BP 130/88 | HR 69 | Temp 98.2°F | Ht 61.0 in | Wt 167.6 lb

## 2017-08-11 DIAGNOSIS — R1110 Vomiting, unspecified: Secondary | ICD-10-CM

## 2017-08-11 DIAGNOSIS — R768 Other specified abnormal immunological findings in serum: Secondary | ICD-10-CM | POA: Diagnosis not present

## 2017-08-11 DIAGNOSIS — R197 Diarrhea, unspecified: Secondary | ICD-10-CM

## 2017-08-11 LAB — SEDIMENTATION RATE: Sed Rate: 4 mm/hr (ref 0–30)

## 2017-08-11 NOTE — Patient Instructions (Addendum)
It was good to see you again today I will be in touch with your labs asap and we will plan the next step Do avoid iceberg lettuce and larger amounts of tomatoes for the time being since they seem to trigger your bowel distress

## 2017-08-12 LAB — ANTI-NUCLEAR AB-TITER (ANA TITER): ANA Titer 1: 1:320 {titer} — ABNORMAL HIGH

## 2017-08-12 LAB — ANA: Anti Nuclear Antibody(ANA): POSITIVE — AB

## 2017-08-14 ENCOUNTER — Encounter: Payer: Self-pay | Admitting: Family Medicine

## 2017-08-14 NOTE — Addendum Note (Signed)
Addended by: Lamar Blinks C on: 08/14/2017 07:46 PM   Modules accepted: Orders

## 2017-09-02 ENCOUNTER — Other Ambulatory Visit: Payer: Self-pay | Admitting: Family Medicine

## 2017-11-02 ENCOUNTER — Other Ambulatory Visit: Payer: Self-pay | Admitting: Family Medicine

## 2017-11-20 ENCOUNTER — Encounter: Payer: Self-pay | Admitting: Family Medicine

## 2017-11-21 MED ORDER — BENZONATATE 100 MG PO CAPS: 100.0000 mg | ORAL_CAPSULE | Freq: Three times a day (TID) | ORAL | 0 refills | Status: DC | PRN

## 2017-11-29 ENCOUNTER — Telehealth: Payer: Self-pay | Admitting: Family Medicine

## 2017-11-29 NOTE — Telephone Encounter (Signed)
Copied from [place]. Topic: Quick Communication - See Telephone Encounter >> Nov 29, 2017  4:45 PM Cleaster Corin, NT wrote: CRM for notification. See Telephone encounter for:   11/29/17. Travis from [employer] called to see if pt. Can get a refill on Advair Diskus  [id number] - [place], [place] - [address] [place] [address] Phone: [phone] Fax: [phone] Not a 24 hour pharmacy; exact hours not known

## 2017-12-01 ENCOUNTER — Other Ambulatory Visit: Payer: Self-pay

## 2017-12-01 MED ORDER — FLUTICASONE-SALMETEROL 250-50 MCG/DOSE IN AEPB: INHALATION_SPRAY | RESPIRATORY_TRACT | 1 refills | Status: DC

## 2017-12-07 ENCOUNTER — Ambulatory Visit (INDEPENDENT_AMBULATORY_CARE_PROVIDER_SITE_OTHER): Payer: Medicare Other | Admitting: Family Medicine

## 2017-12-07 ENCOUNTER — Encounter: Payer: Self-pay | Admitting: Family Medicine

## 2017-12-07 VITALS — BP 117/64 | HR 78 | Temp 98.1°F | Resp 16 | Ht 61.0 in | Wt 166.0 lb

## 2017-12-07 DIAGNOSIS — R5381 Other malaise: Secondary | ICD-10-CM

## 2017-12-07 DIAGNOSIS — R7303 Prediabetes: Secondary | ICD-10-CM | POA: Diagnosis not present

## 2017-12-07 DIAGNOSIS — J40 Bronchitis, not specified as acute or chronic: Secondary | ICD-10-CM

## 2017-12-07 LAB — POCT INFLUENZA A/B
Influenza A, POC: NEGATIVE
Influenza B, POC: NEGATIVE

## 2017-12-07 MED ORDER — METFORMIN HCL 500 MG PO TABS: ORAL_TABLET | ORAL | 0 refills | Status: DC

## 2017-12-07 MED ORDER — DOXYCYCLINE HYCLATE 100 MG PO CAPS: 100.0000 mg | ORAL_CAPSULE | Freq: Two times a day (BID) | ORAL | 0 refills | Status: DC

## 2017-12-07 NOTE — Patient Instructions (Signed)
It was good to see you today- I hope that you are feeling much better soon! Use the doxycycline twice a day for 10 days- take with food and water You can take 2 of the tessalon perles up to every 8 hours as needed
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
## 2017-12-14 ENCOUNTER — Encounter: Payer: Self-pay | Admitting: Family Medicine

## 2017-12-14 ENCOUNTER — Ambulatory Visit (INDEPENDENT_AMBULATORY_CARE_PROVIDER_SITE_OTHER): Payer: Medicare Other | Admitting: Family Medicine

## 2017-12-14 VITALS — BP 111/57 | HR 79 | Temp 97.9°F | Resp 16 | Ht 61.0 in | Wt 166.0 lb

## 2017-12-14 DIAGNOSIS — R7303 Prediabetes: Secondary | ICD-10-CM | POA: Diagnosis not present

## 2017-12-14 DIAGNOSIS — E785 Hyperlipidemia, unspecified: Secondary | ICD-10-CM | POA: Diagnosis not present

## 2017-12-14 DIAGNOSIS — R198 Other specified symptoms and signs involving the digestive system and abdomen: Secondary | ICD-10-CM

## 2017-12-14 DIAGNOSIS — I10 Essential (primary) hypertension: Secondary | ICD-10-CM | POA: Diagnosis not present

## 2017-12-14 DIAGNOSIS — D869 Sarcoidosis, unspecified: Secondary | ICD-10-CM

## 2017-12-14 DIAGNOSIS — T781XXA Other adverse food reactions, not elsewhere classified, initial encounter: Secondary | ICD-10-CM

## 2017-12-14 DIAGNOSIS — Z23 Encounter for immunization: Secondary | ICD-10-CM | POA: Diagnosis not present

## 2017-12-14 DIAGNOSIS — H2513 Age-related nuclear cataract, bilateral: Secondary | ICD-10-CM | POA: Diagnosis not present

## 2017-12-14 LAB — BASIC METABOLIC PANEL
BUN: 15 mg/dL (ref 6–23)
CO2: 28 mEq/L (ref 19–32)
Calcium: 9.2 mg/dL (ref 8.4–10.5)
Chloride: 104 mEq/L (ref 96–112)
Creatinine, Ser: 0.77 mg/dL (ref 0.40–1.20)
GFR: 78.76 mL/min (ref >60.00)
Glucose, Bld: 95 mg/dL (ref 70–99)
Potassium: 3.5 mEq/L (ref 3.5–5.1)
Sodium: 140 mEq/L (ref 135–145)

## 2017-12-14 LAB — HEMOGLOBIN A1C: Hgb A1c MFr Bld: 5.8 % (ref 4.6–6.5)

## 2017-12-14 NOTE — Patient Instructions (Signed)
Pleasure to see you today as always!  I will be in touch with your labs asap

## 2017-12-15 LAB — FOOD ALLERGY PROFILE
Allergen, Salmon, f41: <0.1 kU/L
Almonds: <0.1 kU/L
CLASS: 0
CLASS: 0
CLASS: 0
CLASS: 0
CLASS: 0
CLASS: 0
CLASS: 0
CLASS: 0
CLASS: 0
CLASS: 0
CLASS: 0
Cashew IgE: <0.1 kU/L
Class: 0
Class: 0
Class: 0
Class: 0
Egg White IgE: <0.1 kU/L
Fish Cod: <0.1 kU/L
Hazelnut: <0.1 kU/L
Milk IgE: <0.1 kU/L
Peanut IgE: <0.1 kU/L
Scallop IgE: <0.1 kU/L
Sesame Seed f10: <0.1 kU/L
Shrimp IgE: <0.1 kU/L
Soybean IgE: <0.1 kU/L
Tuna IgE: <0.1 kU/L
Walnut: <0.1 kU/L
Wheat IgE: <0.1 kU/L

## 2017-12-15 LAB — INTERPRETATION:

## 2017-12-18 ENCOUNTER — Encounter: Payer: Self-pay | Admitting: Family Medicine

## 2017-12-31 ENCOUNTER — Encounter: Payer: Self-pay | Admitting: Family Medicine

## 2018-01-05 ENCOUNTER — Encounter: Payer: Self-pay | Admitting: Family Medicine

## 2018-01-06 MED ORDER — BENZONATATE 100 MG PO CAPS: 100.0000 mg | ORAL_CAPSULE | Freq: Three times a day (TID) | ORAL | 1 refills | Status: DC | PRN

## 2018-01-31 ENCOUNTER — Ambulatory Visit: Payer: BLUE CROSS/BLUE SHIELD | Admitting: Rheumatology

## 2018-02-16 ENCOUNTER — Other Ambulatory Visit: Payer: Self-pay | Admitting: Emergency Medicine

## 2018-02-16 MED ORDER — FLUTICASONE-SALMETEROL 250-50 MCG/DOSE IN AEPB: INHALATION_SPRAY | RESPIRATORY_TRACT | 1 refills | Status: DC
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
## 2018-02-22 ENCOUNTER — Encounter: Payer: Self-pay | Admitting: Family Medicine

## 2018-02-22 MED ORDER — ADVAIR DISKUS 250-50 MCG/DOSE IN AEPB: 1.0000 | INHALATION_SPRAY | Freq: Two times a day (BID) | RESPIRATORY_TRACT | 5 refills | Status: DC

## 2018-02-27 ENCOUNTER — Ambulatory Visit: Payer: BLUE CROSS/BLUE SHIELD | Admitting: Rheumatology

## 2018-03-03 ENCOUNTER — Other Ambulatory Visit: Payer: Self-pay | Admitting: Family Medicine

## 2018-03-06 ENCOUNTER — Ambulatory Visit (INDEPENDENT_AMBULATORY_CARE_PROVIDER_SITE_OTHER): Payer: Medicare Other | Admitting: Rheumatology

## 2018-03-06 ENCOUNTER — Encounter: Payer: Self-pay | Admitting: Rheumatology

## 2018-03-06 VITALS — BP 103/65 | HR 66 | Resp 16 | Ht 61.0 in | Wt 162.0 lb

## 2018-03-06 DIAGNOSIS — E042 Nontoxic multinodular goiter: Secondary | ICD-10-CM | POA: Diagnosis not present

## 2018-03-06 DIAGNOSIS — M8589 Other specified disorders of bone density and structure, multiple sites: Secondary | ICD-10-CM | POA: Diagnosis not present

## 2018-03-06 DIAGNOSIS — Z8719 Personal history of other diseases of the digestive system: Secondary | ICD-10-CM | POA: Diagnosis not present

## 2018-03-06 DIAGNOSIS — D869 Sarcoidosis, unspecified: Secondary | ICD-10-CM

## 2018-03-06 DIAGNOSIS — R7303 Prediabetes: Secondary | ICD-10-CM

## 2018-03-06 DIAGNOSIS — R768 Other specified abnormal immunological findings in serum: Secondary | ICD-10-CM | POA: Diagnosis not present

## 2018-03-06 DIAGNOSIS — Z8679 Personal history of other diseases of the circulatory system: Secondary | ICD-10-CM | POA: Diagnosis not present

## 2018-03-07 ENCOUNTER — Other Ambulatory Visit: Payer: Self-pay

## 2018-03-07 DIAGNOSIS — R7303 Prediabetes: Secondary | ICD-10-CM

## 2018-03-07 MED ORDER — METFORMIN HCL 500 MG PO TABS: ORAL_TABLET | ORAL | 0 refills | Status: DC

## 2018-03-17 ENCOUNTER — Encounter: Payer: Self-pay | Admitting: Nurse Practitioner

## 2018-03-17 ENCOUNTER — Ambulatory Visit (INDEPENDENT_AMBULATORY_CARE_PROVIDER_SITE_OTHER): Payer: Medicare Other | Admitting: Nurse Practitioner

## 2018-03-17 VITALS — BP 102/68 | HR 76 | Temp 98.2°F | Ht 61.0 in | Wt 158.8 lb

## 2018-03-17 DIAGNOSIS — J0140 Acute pansinusitis, unspecified: Secondary | ICD-10-CM | POA: Diagnosis not present

## 2018-03-17 MED ORDER — AZITHROMYCIN 250 MG PO TABS: 250.0000 mg | ORAL_TABLET | Freq: Every day | ORAL | 0 refills | Status: DC

## 2018-03-17 MED ORDER — DM-GUAIFENESIN ER 30-600 MG PO TB12: 1.0000 | ORAL_TABLET | Freq: Two times a day (BID) | ORAL | 0 refills | Status: DC | PRN

## 2018-03-17 MED ORDER — SALINE SPRAY 0.65 % NA SOLN: 1.0000 | NASAL | 0 refills | Status: DC | PRN

## 2018-03-17 MED ORDER — ALBUTEROL SULFATE HFA 108 (90 BASE) MCG/ACT IN AERS: 1.0000 | INHALATION_SPRAY | Freq: Four times a day (QID) | RESPIRATORY_TRACT | 0 refills | Status: DC | PRN

## 2018-03-17 NOTE — Progress Notes (Signed)
Subjective:  Patient ID: Kristina Mann, female    DOB: August 15, 1946  Age: 72 y.o. MRN: [id number]  CC: Sinusitis (started about a week ago. Cough with yellow sputum. OTC meds last 2 nights hasn't really helped.)   Sinusitis  This is a new problem. The current episode started in the past 7 days. The problem has been gradually worsening since onset. There has been no fever. Associated symptoms include chills, congestion, coughing, headaches, shortness of breath and swollen glands. Pertinent negatives include no diaphoresis, ear pain, hoarse voice or neck pain. Past treatments include oral decongestants. The treatment provided no relief.  reports yellow nasal drainage and fatigue.  Outpatient Medications Prior to Visit  Medication Sig Dispense Refill  . ADVAIR DISKUS 250-50 MCG/DOSE AEPB Inhale 1 puff into the lungs 2 (two) times daily. 60 each 5  . aspirin 81 MG chewable tablet Chew 81 mg by mouth daily.    . calcium-vitamin D 250-100 MG-UNIT per tablet Take 1 tablet by mouth 2 (two) times daily.    . cetirizine (ZYRTEC) 10 MG tablet Take 10 mg by mouth daily.    . chlorthalidone (HYGROTON) 25 MG tablet TAKE 0.5 TABLETS (12.5 MG TOTAL) BY MOUTH DAILY. 45 tablet 1  . Esomeprazole Magnesium (NEXIUM PO) Take 40 mg by mouth daily.     . fluticasone (FLONASE) 50 MCG/ACT nasal spray USE 2 SPRAYS IN EACH       NOSTRIL DAILY 48 g 3  . KRILL OIL PO Take by mouth daily.    Marland Kitchen lisinopril (PRINIVIL,ZESTRIL) 5 MG tablet TAKE 1 TABLET(5 MG) BY MOUTH DAILY 90 tablet 1  . metFORMIN (GLUCOPHAGE) 500 MG tablet TAKE 1 TABLET(500 MG) BY MOUTH TWICE DAILY WITH A MEAL 180 tablet 0  . Multiple Vitamin (MULTIVITAMIN) capsule Take 1 capsule by mouth daily.    Marland Kitchen OVER THE COUNTER MEDICATION Biotin skin, hair, nails taking    . OVER THE COUNTER MEDICATION OTC Co Q10 taking    . OVER THE COUNTER MEDICATION Glucosamine Chondriotin taking    . OVER THE COUNTER MEDICATION Garlic tablets taking    . benzonatate (TESSALON)  100 MG capsule Take 1 capsule (100 mg total) by mouth 3 (three) times daily as needed for cough. (Patient not taking: Reported on 03/17/2018) 90 capsule 1  . doxycycline (VIBRAMYCIN) 100 MG capsule Take 1 capsule (100 mg total) by mouth 2 (two) times daily. (Patient not taking: Reported on 03/06/2018) 20 capsule 0   No facility-administered medications prior to visit.     ROS See HPI  Objective:  BP 102/68   Pulse 76   Temp 98.2 F (36.8 C)   Ht 5\' 1"  (1.549 m)   Wt 158 lb 12.8 oz (72 kg)   SpO2 94%   BMI 30.00 kg/m   BP Readings from Last 3 Encounters:  03/17/18 102/68  03/06/18 103/65  12/14/17 (!) 111/57    Wt Readings from Last 3 Encounters:  03/17/18 158 lb 12.8 oz (72 kg)  03/06/18 162 lb (73.5 kg)  12/14/17 166 lb (75.3 kg)    Physical Exam  Constitutional: She is oriented to person, place, and time.  HENT:  Right Ear: Tympanic membrane, external ear and ear canal normal.  Left Ear: Tympanic membrane, external ear and ear canal normal.  Nose: Mucosal edema and rhinorrhea present. Right sinus exhibits maxillary sinus tenderness. Right sinus exhibits no frontal sinus tenderness. Left sinus exhibits maxillary sinus tenderness. Left sinus exhibits no frontal sinus tenderness.  Mouth/Throat: Uvula is  midline. No trismus in the jaw. Posterior oropharyngeal erythema present. No oropharyngeal exudate.  Eyes: No scleral icterus.  Neck: Normal range of motion. Neck supple.  Cardiovascular: Normal rate and normal heart sounds.  Pulmonary/Chest: Effort normal and breath sounds normal.  Musculoskeletal: She exhibits no edema.  Lymphadenopathy:    She has cervical adenopathy.  Neurological: She is alert and oriented to person, place, and time.  Vitals reviewed.   Lab Results  Component Value Date   WBC 6.4 06/08/2017   HGB 13.7 06/08/2017   HCT 40.4 06/08/2017   PLT 346.0 06/08/2017   GLUCOSE 95 12/14/2017   CHOL 191 06/08/2017   TRIG 130.0 06/08/2017   HDL 55.50  06/08/2017   LDLCALC 110 (H) 06/08/2017   ALT 23 06/08/2017   AST 21 06/08/2017   NA 140 12/14/2017   K 3.5 12/14/2017   CL 104 12/14/2017   CREATININE 0.77 12/14/2017   BUN 15 12/14/2017   CO2 28 12/14/2017   TSH 0.518 09/17/2015   HGBA1C 5.8 12/14/2017    Dg Finger Thumb Left  Result Date: 01/14/2017 CLINICAL DATA:  Soft tissue swelling and tendinitis of the thumb over the last week. EXAM: LEFT THUMB 2+V COMPARISON:  None. FINDINGS: There is pronounced osteoarthritis at the first carpal/metacarpal joint with joint space narrowing, osteophyte formation and intraosseous cyst formation. There could be calcified loose bodies as well. Mild osteoarthritis of the MCP joint and the inter phalangeal joint. 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 Electronically Signed   By: Nelson Chimes M.D.   On: 01/14/2017 12:40    Assessment & Plan:   [name] was seen today for sinusitis.  Diagnoses and all orders for this visit:  Acute non-recurrent pansinusitis -     dextromethorphan-guaiFENesin (MUCINEX DM) 30-600 MG 12hr tablet; Take 1 tablet by mouth 2 (two) times daily as needed for cough. -     albuterol (PROVENTIL HFA;VENTOLIN HFA) 108 (90 Base) MCG/ACT inhaler; Inhale 1-2 puffs into the lungs every 6 (six) hours as needed. -     azithromycin (ZITHROMAX Z-PAK) 250 MG tablet; Take 1 tablet (250 mg total) by mouth daily. Take 2tabs on first day, then 1tab once a day till complete -     sodium chloride (OCEAN) 0.65 % SOLN nasal spray; Place 1 spray into both nostrils as needed for congestion.   I have discontinued [name] doxycycline and benzonatate. I am also having her start on dextromethorphan-guaiFENesin, albuterol, azithromycin, and sodium chloride. Additionally, I am having her maintain her multivitamin, aspirin, calcium-vitamin D, fluticasone, OVER THE COUNTER MEDICATION, OVER THE  COUNTER MEDICATION, OVER THE COUNTER MEDICATION, OVER THE COUNTER MEDICATION, KRILL OIL PO, Esomeprazole Magnesium (NEXIUM PO), cetirizine, lisinopril, ADVAIR DISKUS, chlorthalidone, and metFORMIN.  Meds ordered this encounter  Medications  . dextromethorphan-guaiFENesin (MUCINEX DM) 30-600 MG 12hr tablet    Sig: Take 1 tablet by mouth 2 (two) times daily as needed for cough.    Dispense:  14 tablet    Refill:  0    Order Specific Question:   Supervising Provider    Answer:   Lucille Passy [[id number]]  . albuterol (PROVENTIL HFA;VENTOLIN HFA) 108 (90 Base) MCG/ACT inhaler    Sig: Inhale 1-2 puffs into the lungs every 6 (six) hours as needed.    Dispense:  1 Inhaler    Refill:  0    Order Specific Question:   Supervising Provider  Answer:   Lucille Passy [[id number]]  . azithromycin (ZITHROMAX Z-PAK) 250 MG tablet    Sig: Take 1 tablet (250 mg total) by mouth daily. Take 2tabs on first day, then 1tab once a day till complete    Dispense:  6 tablet    Refill:  0    Order Specific Question:   Supervising Provider    Answer:   Lucille Passy [[id number]]  . sodium chloride (OCEAN) 0.65 % SOLN nasal spray    Sig: Place 1 spray into both nostrils as needed for congestion.    Dispense:  15 mL    Refill:  0    Order Specific Question:   Supervising Provider    Answer:   Lucille Passy [[id number]]    Follow-up: Return if symptoms worsen or fail to improve.  Wilfred Lacy, NP

## 2018-03-23 ENCOUNTER — Encounter: Payer: Self-pay | Admitting: Family Medicine

## 2018-03-23 DIAGNOSIS — Z1231 Encounter for screening mammogram for malignant neoplasm of breast: Secondary | ICD-10-CM | POA: Diagnosis not present

## 2018-03-23 DIAGNOSIS — Z803 Family history of malignant neoplasm of breast: Secondary | ICD-10-CM | POA: Diagnosis not present

## 2018-04-04 ENCOUNTER — Ambulatory Visit: Payer: BLUE CROSS/BLUE SHIELD | Admitting: Rheumatology

## 2018-04-13 ENCOUNTER — Other Ambulatory Visit: Payer: Self-pay | Admitting: Family Medicine

## 2018-04-21 ENCOUNTER — Other Ambulatory Visit: Payer: Self-pay | Admitting: Family Medicine

## 2018-04-21 NOTE — Telephone Encounter (Signed)
Received refill request for lisinopril (PRINIVIL, ZESTRIL) 5 MG tablet. Last office visit 12/14/17 and last refill 11/03/2017. Pt has upcoming appt scheudled for 06/14/18 with Dr. Lorelei Pont. Refill sent to pharmacy.

## 2018-05-16 DIAGNOSIS — E042 Nontoxic multinodular goiter: Secondary | ICD-10-CM | POA: Diagnosis not present

## 2018-05-16 DIAGNOSIS — E0590 Thyrotoxicosis, unspecified without thyrotoxic crisis or storm: Secondary | ICD-10-CM | POA: Diagnosis not present

## 2018-05-16 DIAGNOSIS — I10 Essential (primary) hypertension: Secondary | ICD-10-CM | POA: Diagnosis not present

## 2018-06-11 NOTE — Progress Notes (Addendum)
[place] [facility] [address], [place], [place] [address] [phone]  Date:  06/14/2018   Name:  Kristina Mann   DOB:  Jun 06, 1946   MRN:  [id number]  PCP:  Darreld Mclean, MD    Chief Complaint: No chief complaint on file.   History of Present Illness:  Kristina Mann is a 72 y.o. very pleasant female patient who presents with the following:  Periodic follow-up visit today History of HTN, pre-diabetes, goiter, sarcoidosis  Lab Results  Component Value Date   HGBA1C 5.8 12/14/2017    Per rheumatology note from March of this year, Dr. Keturah Barre Visit Diagnoses: Sarcoidosis: Patient gives history of sarcoidosis was presented in 1994 with shortness of breath, erythema nodosum lesions and hilar lymphadenopathy.  She has had no recurrence of symptoms and her disease has been quiet.  I reviewed her x-rays from 75 which showed old hilar lymphadenopathy.  She has no large lymph nodes on examination today.  She has no pulmonary symptoms no joint symptoms. Positive ANA (antinuclear antibody) - ANA 1:320 speckled: She has no clinical features of autoimmune disease at this point.  We discussed possibly obtaining ENA panel.  As she was asymptomatic she would like to wait.  Positive ANA could be seen in normal population as the person ages.  If she develops any new symptoms she will notify   Dr. Tamala Julian with [facility] is her endocrinologist and has been following her thyroid- she was just seen there last month, she is followed up annually and her thyroid levels have been normal  Last seen by myself in December She is using for HTN and pre-diabetes:  Chlorthalidone 12.5 Lisinopril 5 Metformin 500 BID  She is fasting today for labs  She is using the Advair, but is not needing to use the albuterol really at all  She is also using OTC allergy meds  Notes that she has lost 20 lbs by following a diet prescribed by her daughter who is studying  nutrition- she wonders if she might be able to come off some of her BP medication and also her metformin.  When she tried to come off chlorthalidone in the past she did have some swelling however   She also has noted a plantar wart on the left foot and perhaps an AK on her instep- she has had AK in the past but never frank skin cancer per her report   Wt Readings from Last 3 Encounters:  06/14/18 154 lb (69.9 kg)  03/17/18 158 lb 12.8 oz (72 kg)  03/06/18 162 lb (73.5 kg)    BP Readings from Last 3 Encounters:  06/14/18 110/68  03/17/18 102/68  03/06/18 103/65    Patient Active Problem List   Diagnosis Date Noted  . Osteopenia 03/30/2017  . Trigger finger, acquired 01/18/2017  . Multinodular goiter 05/12/2016  . Subclinical hyperthyroidism 05/12/2016  . Pre-diabetes 06/16/2015  . Low back pain 11/20/2013  . Seasonal allergies 01/20/2012  . Thyroid nodule 01/20/2012  . Sarcoidosis 01/20/2012  . HTN (hypertension) 01/20/2012  . Basal cell cancer 01/20/2012    Past Medical History:  Diagnosis Date  . Basal cell cancer 06/2005   Patient denies any cancer  . Bronchitis   . Chicken pox   . GERD (gastroesophageal reflux disease)   . Hypertension   . 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 Sarcoidosis   . Seasonal allergies   . Thyroid dysfunction  NODULE ABN CELLS 2006    Past Surgical History:  Procedure Laterality Date  . ABDOMINAL HYSTERECTOMY  1992  . APPENDECTOMY  1978  . CARPAL TUNNEL RELEASE Left 1979  . CARPAL TUNNEL RELEASE Right 1980  . [place]  . TIBIA FRACTURE SURGERY Left 2009   had 6 surgeries to repair and debride; and skin graft  . TONSILLECTOMY AND ADENOIDECTOMY  1953  . TUBAL LIGATION  1983  . 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 Smoking status: Never Smoker  . Smokeless tobacco: Never Used  Substance Use Topics  . Alcohol use: Yes    Alcohol/week: 0.0 - 0.6 oz  . Drug use: No    Family  History  Problem Relation Age of Onset  . Diabetes Mother   . Cancer Mother        BREAST, THYROID  . Dementia Mother   . Heart disease Father        MI X3  . Cancer Father        lung  . COPD Father   . Hypertension Sister   . Hypertension Brother   . Hypertension Son        mild   . Colon cancer Neg Hx     Allergies  Allergen Reactions  . Codeine Nausea And Vomiting    Medication list has been reviewed and updated.  Current Outpatient Medications on File Prior to Visit  Medication Sig Dispense Refill  . ADVAIR DISKUS 250-50 MCG/DOSE AEPB Inhale 1 puff into the lungs 2 (two) times daily. 60 each 5  . albuterol (PROVENTIL HFA;VENTOLIN HFA) 108 (90 Base) MCG/ACT inhaler Inhale 1-2 puffs into the lungs every 6 (six) hours as needed. 1 Inhaler 0  . aspirin 81 MG chewable tablet Chew 81 mg by mouth daily.    Marland Kitchen azithromycin (ZITHROMAX Z-PAK) 250 MG tablet Take 1 tablet (250 mg total) by mouth daily. Take 2tabs on first day, then 1tab once a day till complete 6 tablet 0  . calcium-vitamin D 250-100 MG-UNIT per tablet Take 1 tablet by mouth 2 (two) times daily.    . cetirizine (ZYRTEC) 10 MG tablet Take 10 mg by mouth daily.    . chlorthalidone (HYGROTON) 25 MG tablet TAKE 0.5 TABLETS (12.5 MG TOTAL) BY MOUTH DAILY. 45 tablet 1  . dextromethorphan-guaiFENesin (MUCINEX DM) 30-600 MG 12hr tablet Take 1 tablet by mouth 2 (two) times daily as needed for cough. 14 tablet 0  . Esomeprazole Magnesium (NEXIUM PO) Take 40 mg by mouth daily.     . fluticasone (FLONASE) 50 MCG/ACT nasal spray USE 2 SPRAYS IN EACH       NOSTRIL DAILY 48 g 3  . Fluticasone-Salmeterol (ADVAIR DISKUS) 250-50 MCG/DOSE AEPB TAKE 1 PUFF BY MOUTH TWICE A DAY 60 each 1  . KRILL OIL PO Take by mouth daily.    Marland Kitchen lisinopril (PRINIVIL,ZESTRIL) 5 MG tablet TAKE 1 TABLET(5 MG) BY MOUTH DAILY 90 tablet 1  . metFORMIN (GLUCOPHAGE) 500 MG tablet TAKE 1 TABLET(500 MG) BY MOUTH TWICE DAILY WITH A MEAL 180 tablet 0  . Multiple  Vitamin (MULTIVITAMIN) capsule Take 1 capsule by mouth daily.    Marland Kitchen OVER THE COUNTER MEDICATION Biotin skin, hair, nails taking    . OVER THE COUNTER MEDICATION OTC Co Q10 taking    . OVER THE COUNTER MEDICATION Glucosamine Chondriotin taking    . OVER THE COUNTER MEDICATION Garlic tablets taking    .  sodium chloride (OCEAN) 0.65 % SOLN nasal spray Place 1 spray into both nostrils as needed for congestion. 15 mL 0   No current facility-administered medications on file prior to visit.     Review of Systems:  As per HPI- otherwise negative. No fever or chills No CP or SOB Weight loss is intentional She is less stressed now that she is retired from her job   Physical Examination: Vitals:   06/14/18 0830  BP: 110/68  Pulse: 68  Resp: 16  Temp: 98 F (36.7 C)  SpO2: 98%   Vitals:   06/14/18 0830  Weight: 154 lb (69.9 kg)  Height: 5\' 1"  (1.549 m)   Body mass index is 29.1 kg/m. Ideal Body Weight: Weight in (lb) to have BMI = 25: 132  GEN: WDWN, NAD, Non-toxic, A & O x 3, overweight, looks well. Has lost weight  HEENT: Atraumatic, Normocephalic. Neck supple. No masses, No LAD.  Bilateral TM wnl, oropharynx normal.  PEERL,EOMI.   Ears and Nose: No external deformity. CV: RRR, No M/G/R. No JVD. No thrill. No extra heart sounds. PULM: CTA B, no wheezes, crackles, rhonchi. No retractions. No resp. distress. No accessory muscle use. ABD: S, NT, ND EXTR: No c/c/e NEURO Normal gait.  PSYCH: Normally interactive. Conversant. Not depressed or anxious appearing.  Calm demeanor.  Left foot: AK on instep Small plantar wart on sole at 5th MT head  VC obtained.  Liquid nitrogen cryotherapy to both above lesions x3 each  Assessment and Plan: Pre-diabetes - Plan: Hemoglobin A1c  Sarcoidosis  Essential hypertension, benign - Plan: CBC, Comprehensive metabolic panel  Positive ANA (antinuclear antibody)  Plantar wart  Weight loss  Screening for hyperlipidemia - Plan: Lipid  panel  Actinic keratosis  Labs pending as above She would like to stop the lisinopril which I think is fine Also will stop metformin- through weight loss her BP is better and expect her A1c will be fine Will plan further follow- up pending labs. Cryotherapy as above- she will let us know if either lesion does not resolve Plan to visit in 6 months   Signed Lamar Blinks, MD  Received her labs Results for orders placed or performed in visit on 06/14/18  Hemoglobin A1c  Result Value Ref Range   Hgb A1c MFr Bld 5.8 4.6 - 6.5 %  Lipid panel  Result Value Ref Range   Cholesterol 204 (H) 0 - 200 mg/dL   Triglycerides 79.0 0.0 - 149.0 mg/dL   HDL 75.10 >39.00 mg/dL   VLDL 15.8 0.0 - 40.0 mg/dL   LDL Cholesterol 113 (H) 0 - 99 mg/dL   Total CHOL/HDL Ratio 3    NonHDL 128.67   CBC  Result Value Ref Range   WBC 6.3 4.0 - 10.5 K/uL   RBC 4.36 3.87 - 5.11 Mil/uL   Platelets 350.0 150.0 - 400.0 K/uL   Hemoglobin 13.2 12.0 - 15.0 g/dL   HCT 39.4 36.0 - 46.0 %   MCV 90.5 78.0 - 100.0 fl   MCHC 33.5 30.0 - 36.0 g/dL   RDW 14.4 11.5 - 15.5 %  Comprehensive metabolic panel  Result Value Ref Range   Sodium 142 135 - 145 mEq/L   Potassium 4.7 3.5 - 5.1 mEq/L   Chloride 104 96 - 112 mEq/L   CO2 31 19 - 32 mEq/L   Glucose, Bld 102 (H) 70 - 99 mg/dL   BUN 14 6 - 23 mg/dL   Creatinine, Ser 0.78 0.40 - 1.20  mg/dL   Total Bilirubin 0.3 0.2 - 1.2 mg/dL   Alkaline Phosphatase 64 39 - 117 U/L   AST 17 0 - 37 U/L   ALT 19 0 - 35 U/L   Total Protein 6.6 6.0 - 8.3 g/dL   Albumin 4.4 3.5 - 5.2 g/dL   Calcium 9.7 8.4 - 10.5 mg/dL   GFR 77.48 >60.00 mL/min   Message to pt   Your A1c is fine, in the pre-diabetes range.  We can check this again in about 6 months since you are going off metformin Cholesterol is overall quite good; you have raised your HDL (good cholestenol) quite a bit!  Metabolic profile looks fine See you in 6 months, take care

## 2018-06-14 ENCOUNTER — Encounter: Payer: Self-pay | Admitting: Family Medicine

## 2018-06-14 ENCOUNTER — Ambulatory Visit (INDEPENDENT_AMBULATORY_CARE_PROVIDER_SITE_OTHER): Payer: Medicare Other | Admitting: Family Medicine

## 2018-06-14 VITALS — BP 110/68 | HR 68 | Temp 98.0°F | Resp 16 | Ht 61.0 in | Wt 154.0 lb

## 2018-06-14 DIAGNOSIS — R768 Other specified abnormal immunological findings in serum: Secondary | ICD-10-CM | POA: Diagnosis not present

## 2018-06-14 DIAGNOSIS — R634 Abnormal weight loss: Secondary | ICD-10-CM

## 2018-06-14 DIAGNOSIS — B070 Plantar wart: Secondary | ICD-10-CM | POA: Diagnosis not present

## 2018-06-14 DIAGNOSIS — L570 Actinic keratosis: Secondary | ICD-10-CM | POA: Diagnosis not present

## 2018-06-14 DIAGNOSIS — I10 Essential (primary) hypertension: Secondary | ICD-10-CM | POA: Diagnosis not present

## 2018-06-14 DIAGNOSIS — Z13220 Encounter for screening for lipoid disorders: Secondary | ICD-10-CM

## 2018-06-14 DIAGNOSIS — R7303 Prediabetes: Secondary | ICD-10-CM

## 2018-06-14 DIAGNOSIS — D869 Sarcoidosis, unspecified: Secondary | ICD-10-CM

## 2018-06-14 LAB — LIPID PANEL
Cholesterol: 204 mg/dL — ABNORMAL HIGH (ref 0–200)
HDL: 75.1 mg/dL (ref >39.00)
LDL Cholesterol: 113 mg/dL — ABNORMAL HIGH (ref 0–99)
NonHDL: 128.67
Total CHOL/HDL Ratio: 3
Triglycerides: 79 mg/dL (ref 0.0–149.0)
VLDL: 15.8 mg/dL (ref 0.0–40.0)

## 2018-06-14 LAB — COMPREHENSIVE METABOLIC PANEL
ALT: 19 U/L (ref 0–35)
AST: 17 U/L (ref 0–37)
Albumin: 4.4 g/dL (ref 3.5–5.2)
Alkaline Phosphatase: 64 U/L (ref 39–117)
BUN: 14 mg/dL (ref 6–23)
CO2: 31 mEq/L (ref 19–32)
Calcium: 9.7 mg/dL (ref 8.4–10.5)
Chloride: 104 mEq/L (ref 96–112)
Creatinine, Ser: 0.78 mg/dL (ref 0.40–1.20)
GFR: 77.48 mL/min (ref >60.00)
Glucose, Bld: 102 mg/dL — ABNORMAL HIGH (ref 70–99)
Potassium: 4.7 mEq/L (ref 3.5–5.1)
Sodium: 142 mEq/L (ref 135–145)
Total Bilirubin: 0.3 mg/dL (ref 0.2–1.2)
Total Protein: 6.6 g/dL (ref 6.0–8.3)

## 2018-06-14 LAB — CBC
HCT: 39.4 % (ref 36.0–46.0)
Hemoglobin: 13.2 g/dL (ref 12.0–15.0)
MCHC: 33.5 g/dL (ref 30.0–36.0)
MCV: 90.5 fl (ref 78.0–100.0)
Platelets: 350 10*3/uL (ref 150.0–400.0)
RBC: 4.36 Mil/uL (ref 3.87–5.11)
RDW: 14.4 % (ref 11.5–15.5)
WBC: 6.3 10*3/uL (ref 4.0–10.5)

## 2018-06-14 LAB — HEMOGLOBIN A1C: Hgb A1c MFr Bld: 5.8 % (ref 4.6–6.5)

## 2018-06-14 NOTE — Patient Instructions (Signed)
Great to see you today!  I will be in touch with your labs Why don't you stop the lisinopril at this time.  We can consider stopping the chlorthalidone in the future if you like, as long as you don't start swelling You can certainly come off metformin- we will monitor your A1c for you periodically Let me know if the plantar wart does not resolve and I can re-freeze for you as needed  congrats on your weight loss!!   Let's plan to visit in 6 months

## 2018-08-22 ENCOUNTER — Encounter: Payer: Self-pay | Admitting: Family Medicine

## 2018-08-22 ENCOUNTER — Other Ambulatory Visit: Payer: Self-pay | Admitting: Family Medicine

## 2018-10-17 ENCOUNTER — Other Ambulatory Visit: Payer: Self-pay | Admitting: Family Medicine

## 2018-10-25 ENCOUNTER — Ambulatory Visit (INDEPENDENT_AMBULATORY_CARE_PROVIDER_SITE_OTHER): Payer: Medicare Other

## 2018-10-25 DIAGNOSIS — Z23 Encounter for immunization: Secondary | ICD-10-CM

## 2018-11-06 NOTE — Progress Notes (Addendum)
Subjective:   Kristina Mann is a 72 y.o. female who presents for an Initial Medicare Annual Wellness Visit.  Pt loves to read and do [place]. Plays cards.  Review of Systems   No ROS.  Medicare Wellness Visit. Additional risk factors are reflected in the social history. Cardiac Risk Factors include: advanced age (>47men, >32 women);hypertension Sleep patterns: no issues. Home Safety/Smoke Alarms: Feels safe in home. Smoke alarms in place. Lives in 2 story home. Lives with husband.  Female:       Mammo-utd       Dexa scan-utd        [facility]- next due 10/2019 Eye- pt reports she goes once yearly in Dec. To see Dr.Gould.     Objective:    Today's Vitals   11/07/18 1024  BP: 120/73  Pulse: 72  SpO2: 96%  Weight: 147 lb 3.2 oz (66.8 kg)  Height: 5\' 1"  (1.549 m)   Body mass index is 27.81 kg/m.  Advanced Directives 11/07/2018 11/08/2014 10/24/2014  Does Patient Have a Medical Advance Directive? No No Yes  Type of Advance Directive - - Living will;Healthcare Power of Attorney  Would patient like information on creating a medical advance directive? Yes (MAU/Ambulatory/Procedural Areas - Information given) No - patient declined information -    Current Medications (verified) Outpatient Encounter Medications as of 11/07/2018  Medication Sig  . ADVAIR DISKUS 250-50 MCG/DOSE AEPB Inhale 1 puff into the lungs 2 (two) times daily. (Patient taking differently: Inhale 1 puff into the lungs daily. )  . aspirin 81 MG chewable tablet Chew 81 mg by mouth daily.  . calcium-vitamin D 250-100 MG-UNIT per tablet Take 1 tablet by mouth 2 (two) times daily.  . cetirizine (ZYRTEC) 10 MG tablet Take 10 mg by mouth daily.  . chlorthalidone (HYGROTON) 25 MG tablet TAKE 0.5 TABLETS (12.5 MG TOTAL) BY MOUTH DAILY.  . fluticasone (FLONASE) 50 MCG/ACT nasal spray USE 2 SPRAYS IN EACH       NOSTRIL DAILY  . lisinopril (PRINIVIL,ZESTRIL) 5 MG tablet TAKE 1 TABLET(5 MG) BY MOUTH DAILY  . Multiple Vitamin  (MULTIVITAMIN) capsule Take 1 capsule by mouth daily.  . sodium chloride (OCEAN) 0.65 % SOLN nasal spray Place 1 spray into both nostrils as needed for congestion.  . [DISCONTINUED] OVER THE COUNTER MEDICATION Biotin skin, hair, nails taking  . dextromethorphan-guaiFENesin (MUCINEX DM) 30-600 MG 12hr tablet Take 1 tablet by mouth 2 (two) times daily as needed for cough. (Patient not taking: Reported on 11/07/2018)  . [DISCONTINUED] albuterol (PROVENTIL HFA;VENTOLIN HFA) 108 (90 Base) MCG/ACT inhaler Inhale 1-2 puffs into the lungs every 6 (six) hours as needed.  . [DISCONTINUED] Esomeprazole Magnesium (NEXIUM PO) Take 40 mg by mouth daily.   . [DISCONTINUED] Fluticasone-Salmeterol (ADVAIR DISKUS) 250-50 MCG/DOSE AEPB TAKE 1 PUFF BY MOUTH TWICE A DAY  . [DISCONTINUED] KRILL OIL PO Take by mouth daily.  . [DISCONTINUED] OVER THE COUNTER MEDICATION OTC Co Q10 taking  . [DISCONTINUED] OVER THE COUNTER MEDICATION Glucosamine Chondriotin taking  . [DISCONTINUED] OVER THE COUNTER MEDICATION Garlic tablets taking   No facility-administered encounter medications on file as of 11/07/2018.     Allergies (verified) Codeine   History: Past Medical History:  Diagnosis Date  . Basal cell cancer 06/2005   Patient denies any cancer  . Bronchitis   . Chicken pox   . GERD (gastroesophageal reflux disease)   . Hypertension   . 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 Sarcoidosis   . Seasonal allergies   .  Thyroid dysfunction     NODULE ABN CELLS 2006   Past Surgical History:  Procedure Laterality Date  . ABDOMINAL HYSTERECTOMY  1992  . APPENDECTOMY  1978  . CARPAL TUNNEL RELEASE Left 1979  . CARPAL TUNNEL RELEASE Right 1980  . [place]  . TIBIA FRACTURE SURGERY Left 2009   had 6 surgeries to repair and debride; and skin graft  . TONSILLECTOMY AND ADENOIDECTOMY  1953  . TUBAL LIGATION  1983  . 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  Diabetes Mother   . Cancer Mother        BREAST, THYROID  . Dementia Mother   . Heart disease Father        MI X3  . Cancer Father        lung  . COPD Father   . Hypertension Sister   . Hypertension Brother   . Hypertension Son        mild   . Colon cancer Neg Hx    Social History   Socioeconomic History  . Marital status: Married    Spouse name: Mallie Mussel  . Number of children: 3  . Years of education: 22  . Highest education level: Not on file  Occupational History  . Occupation: Therapist, art  Social Needs  . Financial resource strain: Not on file  . Food insecurity:    Worry: Not on file    Inability: Not on file  . Transportation needs:    Medical: Not on file    Non-medical: Not on file  Tobacco Use  . Smoking status: Never Smoker  . Smokeless tobacco: Never Used  Substance and Sexual Activity  . Alcohol use: Yes    Alcohol/week: 0.0 - 1.0 standard drinks  . Drug use: No  . Sexual activity: Not Currently  Lifestyle  . Physical activity:    Days per week: Not on file    Minutes per session: Not on file  . Stress: Not on file  Relationships  . Social connections:    Talks on phone: Not on file    Gets together: Not on file    Attends religious service: Not on file    Active member of club or organization: Not on file    Attends meetings of clubs or organizations: Not on file    Relationship status: Not on file  Other Topics Concern  . Not on file  Social History Narrative   O+ BLOOD DONATES Q 3 MONTHS.  EXERCISE-WALKS.   Mother in an assisted living facility in [place], where the patient's sister lives.   The patient lives with her husband.  Her son lives in [place], [place].   One daughter lives in [place], the other in [place]. Patient does exercise.    Tobacco Counseling Counseling given: Not Answered   Clinical Intake: Pain : No/denies pain     Activities of Daily Living In your present state of health, do you have any  difficulty performing the following activities: 11/07/2018 03/17/2018  Hearing? N N  Vision? N N  Difficulty concentrating or making decisions? N N  Walking or climbing stairs? N N  Dressing or bathing? N N  Doing errands, shopping? N N  Preparing Food and eating ? N -  Using the Toilet? N -  In the past six months, have you accidently leaked urine? N -  Do you have problems with loss of bowel control?  N -  Managing your Medications? N -  Managing your Finances? N -  Housekeeping or managing your Housekeeping? N -  Some recent data might be hidden     Immunizations and Health Maintenance Immunization History  Administered Date(s) Administered  . Influenza Whole 11/06/2011  . Influenza, High Dose Seasonal PF 12/13/2016, 12/14/2017, 10/25/2018  . Influenza, Seasonal, Injecte, Preservative Fre 02/19/2013  . Influenza,inj,Quad PF,6+ Mos 12/29/2013, 09/16/2014, 09/17/2015  . Pneumococcal Conjugate-13 09/16/2014  . Pneumococcal Polysaccharide-23 09/26/2004, 03/17/2015  . Tdap 01/13/2011  . Zoster 03/17/2016   There are no preventive care reminders to display for this patient.  Patient Care Team: Copland, Gay Filler, MD as PCP - General (Family Medicine) Amalia Greenhouse, MD (Endocrinology) Sharyne Peach, MD (Ophthalmology) Hurley Cisco, MD (Internal Medicine) Jovita Gamma, MD (Neurosurgery) Ladell Pier, MD (Internal Medicine)  Indicate any recent Medical Services you may have received from other than [facility] providers in the past year (date may be approximate).     Assessment:   This is a routine wellness examination for Kandyce. Physical assessment deferred to PCP.  Hearing/Vision screen  Visual Acuity Screening   Right eye Left eye Both eyes  Without correction: 20/32 20/32 20/32   With correction:     Hearing Screening Comments: Able to hear conversational tones w/o difficulty. No issues reported.    Dietary issues and exercise activities discussed: Current  Exercise Habits: Home exercise routine, Type of exercise: strength training/weights;treadmill;yoga, Time (Minutes): 40, Frequency (Times/Week): 6, Weekly Exercise (Minutes/Week): 240, Intensity: Mild, Exercise limited by: None identified   Diet (meal preparation, eat out, water intake, caffeinated beverages, dairy products, fruits and vegetables): Pt eats a healthy gluten free diet and     Goals    . Patient Stated     Maintain healthy active lifestyle.      Depression Screen PHQ 2/9 Scores 11/07/2018 08/11/2017 03/17/2016 06/16/2015 03/17/2015 09/16/2014 03/11/2014  PHQ - 2 Score 0 0 0 0 0 0 0  Exception Documentation - Patient refusal - - - - -    Fall Risk Fall Risk  11/07/2018 08/11/2017 03/17/2016 03/17/2015 09/16/2014  Falls in the past year? 0 No No No No   Cognitive Function: Ad8 score reviewed for issues:  Issues making decisions:no  Less interest in hobbies / activities:no  Repeats questions, stories (family complaining):no  Trouble using ordinary gadgets (microwave, computer, phone):no  Forgets the month or year: no  Mismanaging finances: no  Remembering appts:no  Daily problems with thinking and/or memory:no Ad8 score is=0         Screening Tests Health Maintenance  Topic Date Due  . COLONOSCOPY  11/09/2019  . MAMMOGRAM  03/24/2020  . TETANUS/TDAP  01/13/2021  . INFLUENZA VACCINE  Completed  . DEXA SCAN  Completed  . Hepatitis C Screening  Completed  . PNA vac Low Risk Adult  Completed     Plan:    Please schedule your next medicare wellness visit with me in 1 yr.  Bring a copy of your living will and/or healthcare power of attorney to your next office visit.   I have personally reviewed and noted the following in the patient's chart:   . Medical and social history . Use of alcohol, tobacco or illicit drugs  . Current medications and supplements . Functional ability and status . Nutritional status . Physical activity . Advanced directives . List  of other physicians . Hospitalizations, surgeries, and ER visits in previous 12 months . Vitals . Screenings to include cognitive, depression,  and falls . Referrals and appointments  In addition, I have reviewed and discussed with patient certain preventive protocols, quality metrics, and best practice recommendations. A written personalized care plan for preventive services as well as general preventive health recommendations were provided to patient.     Shela Nevin, RN   11/07/2018    I have reviewed the above MWE note by Ms. Vevelyn Royals and agree with her documentation  Denny Peon MD

## 2018-11-07 ENCOUNTER — Ambulatory Visit (INDEPENDENT_AMBULATORY_CARE_PROVIDER_SITE_OTHER): Payer: Medicare Other | Admitting: *Deleted

## 2018-11-07 ENCOUNTER — Encounter: Payer: Self-pay | Admitting: *Deleted

## 2018-11-07 VITALS — BP 120/73 | HR 72 | Ht 61.0 in | Wt 147.2 lb

## 2018-11-07 DIAGNOSIS — Z0000 Encounter for general adult medical examination without abnormal findings: Secondary | ICD-10-CM | POA: Diagnosis not present

## 2018-11-07 NOTE — Patient Instructions (Signed)
Please schedule your next medicare wellness visit with me in 1 yr.  Bring a copy of your living will and/or healthcare power of attorney to your next office visit.   Kristina Mann , Thank you for taking time to come for your Medicare Wellness Visit. I appreciate your ongoing commitment to your health goals. Please review the following plan we discussed and let me know if I can assist you in the future.   These are the goals we discussed: Goals    . Patient Stated     Maintain healthy active lifestyle.       This is a list of the screening recommended for you and due dates:  Health Maintenance  Topic Date Due  . Colon Cancer Screening  11/09/2019  . Mammogram  03/24/2020  . Tetanus Vaccine  01/13/2021  . Flu Shot  Completed  . DEXA scan (bone density measurement)  Completed  .  Hepatitis C: One time screening is recommended by Center for Disease Control  (CDC) for  adults born from [birth year] through 1964.   Completed  . Pneumonia vaccines  Completed    Health Maintenance for Postmenopausal Women Menopause is a normal process in which your reproductive ability comes to an end. This process happens gradually over a span of months to years, usually between the ages of 42 and 66. Menopause is complete when you have missed 12 consecutive menstrual periods. It is important to talk with your health care provider about some of the most common conditions that affect postmenopausal women, such as heart disease, cancer, and bone loss (osteoporosis). Adopting a healthy lifestyle and getting preventive care can help to promote your health and wellness. Those actions can also lower your chances of developing some of these common conditions. What should I know about menopause? During menopause, you may experience a number of symptoms, such as:  Moderate-to-severe hot flashes.  Night sweats.  Decrease in sex drive.  Mood swings.  Headaches.  Tiredness.  Irritability.  Memory  problems.  Insomnia.  Choosing to treat or not to treat menopausal changes is an individual decision that you make with your health care provider. What should I know about hormone replacement therapy and supplements? Hormone therapy products are effective for treating symptoms that are associated with menopause, such as hot flashes and night sweats. Hormone replacement carries certain risks, especially as you become older. If you are thinking about using estrogen or estrogen with progestin treatments, discuss the benefits and risks with your health care provider. What should I know about heart disease and stroke? Heart disease, heart attack, and stroke become more likely as you age. This may be due, in part, to the hormonal changes that your body experiences during menopause. These can affect how your body processes dietary fats, triglycerides, and cholesterol. Heart attack and stroke are both medical emergencies. There are many things that you can do to help prevent heart disease and stroke:  Have your blood pressure checked at least every 1-2 years. High blood pressure causes heart disease and increases the risk of stroke.  If you are 52-56 years old, ask your health care provider if you should take aspirin to prevent a heart attack or a stroke.  Do not use any tobacco products, including cigarettes, chewing tobacco, or electronic cigarettes. If you need help quitting, ask your health care provider.  It is important to eat a healthy diet and maintain a healthy weight. ? Be sure to include plenty of vegetables, fruits, low-fat dairy  products, and lean protein. ? Avoid eating foods that are high in solid fats, added sugars, or salt (sodium).  Get regular exercise. This is one of the most important things that you can do for your health. ? Try to exercise for at least 150 minutes each week. The type of exercise that you do should increase your heart rate and make you sweat. This is known as  moderate-intensity exercise. ? Try to do strengthening exercises at least twice each week. Do these in addition to the moderate-intensity exercise.  Know your numbers.Ask your health care provider to check your cholesterol and your blood glucose. Continue to have your blood tested as directed by your health care provider.  What should I know about cancer screening? There are several types of cancer. Take the following steps to reduce your risk and to catch any cancer development as early as possible. Breast Cancer  Practice breast self-awareness. ? This means understanding how your breasts normally appear and feel. ? It also means doing regular breast self-exams. Let your health care provider know about any changes, no matter how small.  If you are 24 or older, have a clinician do a breast exam (clinical breast exam or CBE) every year. Depending on your age, family history, and medical history, it may be recommended that you also have a yearly breast X-ray (mammogram).  If you have a family history of breast cancer, talk with your health care provider about genetic screening.  If you are at high risk for breast cancer, talk with your health care provider about having an MRI and a mammogram every year.  Breast cancer (BRCA) gene test is recommended for women who have family members with BRCA-related cancers. Results of the assessment will determine the need for genetic counseling and BRCA1 and for BRCA2 testing. BRCA-related cancers include these types: ? Breast. This occurs in males or females. ? Ovarian. ? Tubal. This may also be called fallopian tube cancer. ? Cancer of the abdominal or pelvic lining (peritoneal cancer). ? Prostate. ? Pancreatic.  Cervical, Uterine, and Ovarian Cancer Your health care provider may recommend that you be screened regularly for cancer of the pelvic organs. These include your ovaries, uterus, and vagina. This screening involves a pelvic exam, which  includes checking for microscopic changes to the surface of your cervix (Pap test).  For women ages 21-65, health care providers may recommend a pelvic exam and a Pap test every three years. For women ages 24-65, they may recommend the Pap test and pelvic exam, combined with testing for human papilloma virus (HPV), every five years. Some types of HPV increase your risk of cervical cancer. Testing for HPV may also be done on women of any age who have unclear Pap test results.  Other health care providers may not recommend any screening for nonpregnant women who are considered low risk for pelvic cancer and have no symptoms. Ask your health care provider if a screening pelvic exam is right for you.  If you have had past treatment for cervical cancer or a condition that could lead to cancer, you need Pap tests and screening for cancer for at least 20 years after your treatment. If Pap tests have been discontinued for you, your risk factors (such as having a new sexual partner) need to be reassessed to determine if you should start having screenings again. Some women have medical problems that increase the chance of getting cervical cancer. In these cases, your health care provider may recommend that  you have screening and Pap tests more often.  If you have a family history of uterine cancer or ovarian cancer, talk with your health care provider about genetic screening.  If you have vaginal bleeding after reaching menopause, tell your health care provider.  There are currently no reliable tests available to screen for ovarian cancer.  Lung Cancer Lung cancer screening is recommended for adults 81-51 years old who are at high risk for lung cancer because of a history of smoking. A yearly low-dose CT scan of the lungs is recommended if you:  Currently smoke.  Have a history of at least 30 pack-years of smoking and you currently smoke or have quit within the past 15 years. A pack-year is smoking an  average of one pack of cigarettes per day for one year.  Yearly screening should:  Continue until it has been 15 years since you quit.  Stop if you develop a health problem that would prevent you from having lung cancer treatment.  Colorectal Cancer  This type of cancer can be detected and can often be prevented.  Routine colorectal cancer screening usually begins at age 80 and continues through age 64.  If you have risk factors for colon cancer, your health care provider may recommend that you be screened at an earlier age.  If you have a family history of colorectal cancer, talk with your health care provider about genetic screening.  Your health care provider may also recommend using home test kits to check for hidden blood in your stool.  A small camera at the end of a tube can be used to examine your colon directly (sigmoidoscopy or colonoscopy). This is done to check for the earliest forms of colorectal cancer.  Direct examination of the colon should be repeated every 5-10 years until age 79. However, if early forms of precancerous polyps or small growths are found or if you have a family history or genetic risk for colorectal cancer, you may need to be screened more often.  Skin Cancer  Check your skin from head to toe regularly.  Monitor any moles. Be sure to tell your health care provider: ? About any new moles or changes in moles, especially if there is a change in a mole's shape or color. ? If you have a mole that is larger than the size of a pencil eraser.  If any of your family members has a history of skin cancer, especially at a young age, talk with your health care provider about genetic screening.  Always use sunscreen. Apply sunscreen liberally and repeatedly throughout the day.  Whenever you are outside, protect yourself by wearing long sleeves, pants, a wide-brimmed hat, and sunglasses.  What should I know about osteoporosis? Osteoporosis is a condition in  which bone destruction happens more quickly than new bone creation. After menopause, you may be at an increased risk for osteoporosis. To help prevent osteoporosis or the bone fractures that can happen because of osteoporosis, the following is recommended:  If you are 54-6 years old, get at least 1,000 mg of calcium and at least 600 mg of vitamin D per day.  If you are older than age 61 but younger than age 67, get at least 1,200 mg of calcium and at least 600 mg of vitamin D per day.  If you are older than age 46, get at least 1,200 mg of calcium and at least 800 mg of vitamin D per day.  Smoking and excessive alcohol intake increase  the risk of osteoporosis. Eat foods that are rich in calcium and vitamin D, and do weight-bearing exercises several times each week as directed by your health care provider. What should I know about how menopause affects my mental health? Depression may occur at any age, but it is more common as you become older. Common symptoms of depression include:  Low or sad mood.  Changes in sleep patterns.  Changes in appetite or eating patterns.  Feeling an overall lack of motivation or enjoyment of activities that you previously enjoyed.  Frequent crying spells.  Talk with your health care provider if you think that you are experiencing depression. What should I know about immunizations? It is important that you get and maintain your immunizations. These include:  Tetanus, diphtheria, and pertussis (Tdap) booster vaccine.  Influenza every year before the flu season begins.  Pneumonia vaccine.  Shingles vaccine.  Your health care provider may also recommend other immunizations. This information is not intended to replace advice given to you by your health care provider. Make sure you discuss any questions you have with your health care provider. Document Released: 02/04/2006 Document Revised: 07/02/2016 Document Reviewed: 09/16/2015 Elsevier Interactive  Patient Education  2018 [employer].

## 2018-11-08 ENCOUNTER — Other Ambulatory Visit: Payer: Self-pay | Admitting: Family Medicine

## 2018-11-14 ENCOUNTER — Other Ambulatory Visit: Payer: Self-pay

## 2018-12-12 DIAGNOSIS — E119 Type 2 diabetes mellitus without complications: Secondary | ICD-10-CM | POA: Diagnosis not present

## 2018-12-12 LAB — HM DIABETES EYE EXAM

## 2018-12-12 NOTE — Progress Notes (Addendum)
[place] at [facility] [address], [place], [place] [address] [phone]  Date:  12/14/2018   Name:  Kristina Mann   DOB:  01-May-1946   MRN:  [id number]  PCP:  Darreld Mclean, MD    Chief Complaint: Hypertension (6 month follow up)   History of Present Illness:  Kristina Mann is a 72 y.o. very pleasant female patient who presents with the following:  33-month follow-up visit today.  History of prediabetes, sarcoidosis, hypertension, goiter. She has never been a smoker. Last visit with myself in June of this year.  At that time she had lost about 20 pounds by following a diet suggested by her daughter who is studying nutrition. At our last visit she had lost weight, and her blood pressure looked good.  We stopped her lisinopril and also her metformin.  Her cholesterol and A1c looked fine at last visit.  She sees Dr. Tamala Julian at [facility] for endocrinology concerns.  He keeps an eye on her thyroid.  She will see him coming up  I also sent her for rheumatology consultation earlier this year.  She has history of sarcoid, and was noted to have a positive ANA.  They did not pursue any further evaluation at the time of her rheumatology consultation. Immunizations are up-to-date.  She had Zostavax in 2017, so too early for Shingrix.  Full labs done in June Flu shot done Advanced directives turned in today   She is not taking her lisinopril any longer- actually is just taking once a week She does check her glucose on occasion at home; she is taking metformin once a week  She is using chlortalidone 12.5; this is as much for swelling as for BP  She has a home BP cuff   She is exercising 6 days per week- yoga and cross training  Wt Readings from Last 3 Encounters:  12/14/18 144 lb (65.3 kg)  11/07/18 147 lb 3.2 oz (66.8 kg)  06/14/18 154 lb (69.9 kg)   She is still following the nutrition plan set out by her daughter She is seeing Dr.  Delman Cheadle for her eye care and all is well   Patient Active Problem List   Diagnosis Date Noted  . Osteopenia 03/30/2017  . Trigger finger, acquired 01/18/2017  . Multinodular goiter 05/12/2016  . Subclinical hyperthyroidism 05/12/2016  . Pre-diabetes 06/16/2015  . Low back pain 11/20/2013  . Seasonal allergies 01/20/2012  . Thyroid nodule 01/20/2012  . Sarcoidosis 01/20/2012  . HTN (hypertension) 01/20/2012  . Basal cell cancer 01/20/2012    Past Medical History:  Diagnosis Date  . Basal cell cancer 06/2005   Patient denies any cancer  . Bronchitis   . Chicken pox   . GERD (gastroesophageal reflux disease)   . Hypertension   . 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 Sarcoidosis   . Seasonal allergies   . Thyroid dysfunction     NODULE ABN CELLS 2006    Past Surgical History:  Procedure Laterality Date  . ABDOMINAL HYSTERECTOMY  1992  . APPENDECTOMY  1978  . CARPAL TUNNEL RELEASE Left 1979  . CARPAL TUNNEL RELEASE Right 1980  . [place]  . TIBIA FRACTURE SURGERY Left 2009   had 6 surgeries to repair and debride; and skin graft  . TONSILLECTOMY AND ADENOIDECTOMY  1953  . TUBAL LIGATION  1983  . 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  2011    Social History  Tobacco Use  . Smoking status: Never Smoker  . Smokeless tobacco: Never Used  Substance Use Topics  . Alcohol use: Yes    Alcohol/week: 0.0 - 1.0 standard drinks  . Drug use: No    Family History  Problem Relation Age of Onset  . Diabetes Mother   . Cancer Mother        BREAST, THYROID  . Dementia Mother   . Heart disease Father        Kristina X3  . Cancer Father        lung  . COPD Father   . Hypertension Sister   . Hypertension Brother   . Hypertension Son        mild   . Colon cancer Neg Hx     Allergies  Allergen Reactions  . Codeine Nausea And Vomiting    Medication list has been reviewed and updated.  Current Outpatient Medications on File Prior to Visit  Medication Sig Dispense Refill   . aspirin 81 MG chewable tablet Chew 81 mg by mouth daily.    . calcium-vitamin D 250-100 MG-UNIT per tablet Take 1 tablet by mouth 2 (two) times daily.    . cetirizine (ZYRTEC) 10 MG tablet Take 10 mg by mouth daily.    . chlorthalidone (HYGROTON) 25 MG tablet TAKE 0.5 TABLETS (12.5 MG TOTAL) BY MOUTH DAILY. 45 tablet 1  . fluticasone (FLONASE) 50 MCG/ACT nasal spray USE 2 SPRAYS IN EACH       NOSTRIL DAILY 48 g 3  . Fluticasone-Salmeterol (ADVAIR DISKUS) 250-50 MCG/DOSE AEPB TAKE 1 PUFF BY MOUTH TWICE A DAY 60 each 5  . Multiple Vitamin (MULTIVITAMIN) capsule Take 1 capsule by mouth daily.    . sodium chloride (OCEAN) 0.65 % SOLN nasal spray Place 1 spray into both nostrils as needed for congestion. 15 mL 0   No current facility-administered medications on file prior to visit.     Review of Systems:  As per HPI- otherwise negative.   Physical Examination: Vitals:   12/14/18 0826  BP: 120/64  Pulse: 70  Resp: 16  Temp: 98.1 F (36.7 C)  SpO2: 97%   Vitals:   12/14/18 0826  Weight: 144 lb (65.3 kg)  Height: 5\' 1"  (1.549 m)   Body mass index is 27.21 kg/m. Ideal Body Weight: Weight in (lb) to have BMI = 25: 132  GEN: WDWN, NAD, Non-toxic, A & O x 3, mild overweight but looks well  HEENT: Atraumatic, Normocephalic. Neck supple. No masses, No LAD. Ears and Nose: No external deformity. CV: RRR, No M/G/R. No JVD. No thrill. No extra heart sounds. PULM: CTA B, no wheezes, crackles, rhonchi. No retractions. No resp. distress. No accessory muscle use. EXTR: No c/c/e NEURO Normal gait.  PSYCH: Normally interactive. Conversant. Not depressed or anxious appearing.  Calm demeanor.  Left ankle: this joint is thickened and chronically painful due to osteoarthritis.  She is consulted with orthopedics about her ankle.  Assessment and Plan: Pre-diabetes - Plan: Hemoglobin A1c  Essential hypertension, benign - Plan: Basic metabolic panel  Subclinical hyperthyroidism - Plan: TSH,  T4, T3, free, T3, reverse  Screening for iron deficiency anemia - Plan: Ferritin  Osteoarthritis of left ankle, unspecified osteoarthritis type  Following up today.  History of prediabetes and essential hypertension.  However she has lost significatn weight, and we suspect her A1c will be back in normal range.   She is only taking her metformin and lisinopril once a week right now.  Advised that she can stop taking these entirely.  She is still taking chlorthalidone daily.   She will try QOD or prn and see how she does  Will obtain labs as above, her daughter specifically requested the specialized thyroid labs and ferritin level.  I have advised patient that insurance may not cover these labs.   Her ankle is somewhat bothersome.  However, at this point she is able to do everything she wishes to do despite her ankle.  We did briefly discussed that an ankle replacement is fine that could be considered if she is ever interested. I will be in touch with her labs, we plan to check in 6 months.  Signed Lamar Blinks, MD Received her labs, message to pt   Results for orders placed or performed in visit on 41/74/08  Basic metabolic panel  Result Value Ref Range   Sodium 143 135 - 145 mEq/L   Potassium 4.4 3.5 - 5.1 mEq/L   Chloride 104 96 - 112 mEq/L   CO2 30 19 - 32 mEq/L   Glucose, Bld 95 70 - 99 mg/dL   BUN 17 6 - 23 mg/dL   Creatinine, Ser 0.77 0.40 - 1.20 mg/dL   Calcium 9.4 8.4 - 10.5 mg/dL   GFR 78.53 >60.00 mL/min  Hemoglobin A1c  Result Value Ref Range   Hgb A1c MFr Bld 5.7 4.6 - 6.5 %  TSH  Result Value Ref Range   TSH 0.95 0.35 - 4.50 uIU/mL  T3, free  Result Value Ref Range   T3, Free 3.4 2.3 - 4.2 pg/mL  Ferritin  Result Value Ref Range   Ferritin 7.1 (L) 10.0 - 291.0 ng/mL    Your metabolic profile is normal. Thyroid results so far are normal. Your A1c is just at the cutoff for prediabetes, but this is with you taking virtually no metformin.  This looks fine,  continue your good eating and exercise habits. Your iron level is indeed low.  I would suggest you take an over-the-counter iron supplement once or twice daily.  Look for one that has 65 mg of iron, it may say equivalent to 325 mg of ferrous sulfate.  This medication can cause constipation, so you may only be able to tolerate 1 pill/day.  I will order a repeat ferritin level for you.  Please come in for a lab visit only in 6 to 8 weeks and we will check this for you. Otherwise, let us visit in 6 months

## 2018-12-14 ENCOUNTER — Encounter: Payer: Self-pay | Admitting: Family Medicine

## 2018-12-14 ENCOUNTER — Ambulatory Visit (INDEPENDENT_AMBULATORY_CARE_PROVIDER_SITE_OTHER): Payer: Medicare Other | Admitting: Family Medicine

## 2018-12-14 VITALS — BP 120/64 | HR 70 | Temp 98.1°F | Resp 16 | Ht 61.0 in | Wt 144.0 lb

## 2018-12-14 DIAGNOSIS — M19072 Primary osteoarthritis, left ankle and foot: Secondary | ICD-10-CM | POA: Diagnosis not present

## 2018-12-14 DIAGNOSIS — E611 Iron deficiency: Secondary | ICD-10-CM

## 2018-12-14 DIAGNOSIS — I10 Essential (primary) hypertension: Secondary | ICD-10-CM

## 2018-12-14 DIAGNOSIS — R7303 Prediabetes: Secondary | ICD-10-CM

## 2018-12-14 DIAGNOSIS — Z130 Encounter for screening for diseases of the blood and blood-forming organs and certain disorders involving the immune mechanism: Secondary | ICD-10-CM | POA: Diagnosis not present

## 2018-12-14 DIAGNOSIS — E0590 Thyrotoxicosis, unspecified without thyrotoxic crisis or storm: Secondary | ICD-10-CM

## 2018-12-14 LAB — TSH: TSH: 0.95 u[IU]/mL (ref 0.35–4.50)

## 2018-12-14 LAB — BASIC METABOLIC PANEL
BUN: 17 mg/dL (ref 6–23)
CO2: 30 mEq/L (ref 19–32)
Calcium: 9.4 mg/dL (ref 8.4–10.5)
Chloride: 104 mEq/L (ref 96–112)
Creatinine, Ser: 0.77 mg/dL (ref 0.40–1.20)
GFR: 78.53 mL/min (ref >60.00)
Glucose, Bld: 95 mg/dL (ref 70–99)
Potassium: 4.4 mEq/L (ref 3.5–5.1)
Sodium: 143 mEq/L (ref 135–145)

## 2018-12-14 LAB — T3, FREE: T3, Free: 3.4 pg/mL (ref 2.3–4.2)

## 2018-12-14 LAB — HEMOGLOBIN A1C: Hgb A1c MFr Bld: 5.7 % (ref 4.6–6.5)

## 2018-12-14 LAB — FERRITIN: Ferritin: 7.1 ng/mL — ABNORMAL LOW (ref 10.0–291.0)

## 2018-12-14 NOTE — Addendum Note (Signed)
Addended by: Lamar Blinks C on: 12/14/2018 01:10 PM   Modules accepted: Orders

## 2018-12-14 NOTE — Patient Instructions (Signed)
It was good to see you today, happy holidays I will be in touch with your labs ASAP. Your blood pressure looks great.  If you would like to take the chlorthalidone every other day, or even just as needed for swelling I think that is fine.  Do keep an eye on your blood pressure, I do not expect will go up but we should monitor it.  If you are running higher than 135/85, go back on the chlorthalidone daily. Otherwise, you can stop the lisinopril and metformin at this time.  I will let you know how the A1c looks. Let us plan to visit for a wellness check/physical in 6 months.

## 2018-12-17 LAB — T3, REVERSE: T3, Reverse: 14 ng/dL (ref 8–25)

## 2018-12-17 LAB — T4: T4, Total: 9.9 ug/dL (ref 5.1–11.9)

## 2018-12-25 ENCOUNTER — Telehealth: Payer: Self-pay | Admitting: *Deleted

## 2018-12-25 NOTE — Telephone Encounter (Signed)
Received Diabetic Eye Exam Report from [facility]; forwarded to provider/SLS 12/30

## 2019-01-16 ENCOUNTER — Ambulatory Visit (INDEPENDENT_AMBULATORY_CARE_PROVIDER_SITE_OTHER): Payer: Medicare Other | Admitting: Family

## 2019-01-16 ENCOUNTER — Encounter: Payer: Self-pay | Admitting: Family Medicine

## 2019-01-16 VITALS — BP 116/59 | HR 74 | Temp 99.0°F | Resp 16 | Ht 61.0 in | Wt 146.0 lb

## 2019-01-16 DIAGNOSIS — J029 Acute pharyngitis, unspecified: Secondary | ICD-10-CM | POA: Diagnosis not present

## 2019-01-16 DIAGNOSIS — J40 Bronchitis, not specified as acute or chronic: Secondary | ICD-10-CM

## 2019-01-16 LAB — POC INFLUENZA A&B (BINAX/QUICKVUE)
Influenza A, POC: NEGATIVE
Influenza B, POC: NEGATIVE

## 2019-01-16 MED ORDER — AZITHROMYCIN 250 MG PO TABS: ORAL_TABLET | ORAL | 0 refills | Status: DC

## 2019-01-16 MED ORDER — BENZONATATE 100 MG PO CAPS: 100.0000 mg | ORAL_CAPSULE | Freq: Three times a day (TID) | ORAL | 0 refills | Status: DC | PRN

## 2019-01-16 NOTE — Progress Notes (Signed)
Subjective:    Patient ID: Kristina Mann, female    DOB: [birth year], 73 y.o.   MRN: [id number]  HPI  Patient is a 73 yr old female who presents today with chief complaint of cough.  Cough is productive and she notes pain with coughing.  Symptoms began on 1/17 with scratchy throat/cough. Still having sore throat is "tender. "Also reports "swollen glands in her neck." yesterday she tried mucinex for sinus congestion but symptoms worsened.  Had trouble sleeping because of her cough and her nasal congestion. Had some tesssalon perles on hand which seem to help at night.  Reports that she has had low grade temp.  Denies body aches.  She did have a flu shot.   Review of Systems    see HPI  Past Medical History:  Diagnosis Date  . Basal cell cancer 06/2005   Patient denies any cancer  . Bronchitis   . Chicken pox   . GERD (gastroesophageal reflux disease)   . Hypertension   . 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 Sarcoidosis   . Seasonal allergies   . Thyroid dysfunction     NODULE ABN CELLS 2006     Social History   Socioeconomic History  . Marital status: Married    Spouse name: Mallie Mussel  . Number of children: 3  . Years of education: 33  . Highest education level: Not on file  Occupational History  . Occupation: Therapist, art  Social Needs  . Financial resource strain: Not on file  . Food insecurity:    Worry: Not on file    Inability: Not on file  . Transportation needs:    Medical: Not on file    Non-medical: Not on file  Tobacco Use  . Smoking status: Never Smoker  . Smokeless tobacco: Never Used  Substance and Sexual Activity  . Alcohol use: Yes    Alcohol/week: 0.0 - 1.0 standard drinks  . Drug use: No  . Sexual activity: Not Currently  Lifestyle  . Physical activity:    Days per week: Not on file    Minutes per session: Not on file  . Stress: Not on file  Relationships  . Social connections:    Talks on phone: Not on file    Gets together: Not on  file    Attends religious service: Not on file    Active member of club or organization: Not on file    Attends meetings of clubs or organizations: Not on file    Relationship status: Not on file  . Intimate partner violence:    Fear of current or ex partner: Not on file    Emotionally abused: Not on file    Physically abused: Not on file    Forced sexual activity: Not on file  Other Topics Concern  . Not on file  Social History Narrative   O+ BLOOD DONATES Q 3 MONTHS.  EXERCISE-WALKS.   Mother in an assisted living facility in [place], where the patient's sister lives.   The patient lives with her husband.  Her son lives in [place], [place].   One daughter lives in [place], the other in [place]. Patient does exercise.    Past Surgical History:  Procedure Laterality Date  . ABDOMINAL HYSTERECTOMY  1992  . APPENDECTOMY  1978  . CARPAL TUNNEL RELEASE Left 1979  . CARPAL TUNNEL RELEASE Right 1980  . [place]  . TIBIA FRACTURE SURGERY Left 2009   had 6 surgeries  to repair and debride; and skin graft  . TONSILLECTOMY AND ADENOIDECTOMY  1953  . TUBAL LIGATION  1983  . 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 Diabetes Mother   . Cancer Mother        BREAST, THYROID  . Dementia Mother   . Heart disease Father        MI X3  . Cancer Father        lung  . COPD Father   . Hypertension Sister   . Hypertension Brother   . Hypertension Son        mild   . Colon cancer Neg Hx     Allergies  Allergen Reactions  . Codeine Nausea And Vomiting    Current Outpatient Medications on File Prior to Visit  Medication Sig Dispense Refill  . aspirin 81 MG chewable tablet Chew 81 mg by mouth daily.    . calcium-vitamin D 250-100 MG-UNIT per tablet Take 1 tablet by mouth 2 (two) times daily.    . cetirizine (ZYRTEC) 10 MG tablet Take 10 mg by mouth daily.    . chlorthalidone (HYGROTON) 25 MG tablet TAKE 0.5 TABLETS (12.5  MG TOTAL) BY MOUTH DAILY. 45 tablet 1  . fluticasone (FLONASE) 50 MCG/ACT nasal spray USE 2 SPRAYS IN EACH       NOSTRIL DAILY 48 g 3  . Fluticasone-Salmeterol (ADVAIR DISKUS) 250-50 MCG/DOSE AEPB TAKE 1 PUFF BY MOUTH TWICE A DAY 60 each 5  . Multiple Vitamin (MULTIVITAMIN) capsule Take 1 capsule by mouth daily.    . sodium chloride (OCEAN) 0.65 % SOLN nasal spray Place 1 spray into both nostrils as needed for congestion. 15 mL 0   No current facility-administered medications on file prior to visit.     BP (!) 116/59 (BP Location: Left Arm, Patient Position: Sitting, Cuff Size: Small)   Pulse 74   Temp 99 F (37.2 C) (Oral)   Resp 16   Ht 5\' 1"  (1.549 m)   Wt 146 lb (66.2 kg)   SpO2 97%   BMI 27.59 kg/m    Objective:   Physical Exam Constitutional:      Appearance: She is well-developed.  HENT:     Right Ear: Tympanic membrane and ear canal normal.     Left Ear: Tympanic membrane and ear canal normal.     Mouth/Throat:     Mouth: Mucous membranes are moist. No oral lesions.     Pharynx: Posterior oropharyngeal erythema present. No oropharyngeal exudate or uvula swelling.  Neck:     Musculoskeletal: Neck supple.     Thyroid: No thyromegaly.  Cardiovascular:     Rate and Rhythm: Normal rate and regular rhythm.     Heart sounds: Normal heart sounds. No murmur.  Pulmonary:     Effort: Pulmonary effort is normal. No respiratory distress.     Breath sounds: Normal breath sounds. No wheezing.  Skin:    General: Skin is warm and dry.  Neurological:     Mental Status: She is alert and oriented to person, place, and time.  Psychiatric:        Behavior: Behavior normal.        Thought Content: Thought content normal.        Judgment: Judgment normal.           Assessment & Plan:  Bronchitis- clear lung exam but given hx of sarcoidosis and low grade temp will  go ahead and treat empirically with azithromycin. Pt is advised as follows:  Please begin azithromycin  (antibiotic). You may continue to use tessalon as needed for cough. For nasal congestion you may use nasal saline spray and coricidin HBP as needed. Call if new/worsening symptoms or if symptoms are not improved in 3-4 days.

## 2019-01-16 NOTE — Patient Instructions (Addendum)
Please begin azithromycin (antibiotic). You may continue to use tessalon as needed for cough. For nasal congestion you may use nasal saline spray and coricidin HBP as needed. Call if new/worsening symptoms or if symptoms are not improved in 3-4 days.

## 2019-01-24 ENCOUNTER — Encounter: Payer: Self-pay | Admitting: Family Medicine

## 2019-01-24 DIAGNOSIS — J019 Acute sinusitis, unspecified: Secondary | ICD-10-CM

## 2019-01-24 MED ORDER — AMOXICILLIN 500 MG PO CAPS: 1000.0000 mg | ORAL_CAPSULE | Freq: Two times a day (BID) | ORAL | 0 refills | Status: DC

## 2019-01-24 NOTE — Addendum Note (Signed)
Addended by: Lamar Blinks C on: 01/24/2019 12:47 PM   Modules accepted: Orders

## 2019-02-26 ENCOUNTER — Encounter: Payer: Self-pay | Admitting: Family Medicine

## 2019-02-26 DIAGNOSIS — Z13820 Encounter for screening for osteoporosis: Secondary | ICD-10-CM

## 2019-02-26 DIAGNOSIS — E2839 Other primary ovarian failure: Secondary | ICD-10-CM

## 2019-02-27 ENCOUNTER — Telehealth: Payer: Self-pay | Admitting: *Deleted

## 2019-02-27 NOTE — Telephone Encounter (Signed)
Received Physician Orders from [facility]; forwarded to provider/SLS03/03

## 2019-03-13 ENCOUNTER — Encounter: Payer: Self-pay | Admitting: Family Medicine

## 2019-03-13 MED ORDER — CHLORTHALIDONE 25 MG PO TABS: 12.5000 mg | ORAL_TABLET | Freq: Every day | ORAL | 1 refills | Status: DC

## 2019-05-18 DIAGNOSIS — I10 Essential (primary) hypertension: Secondary | ICD-10-CM | POA: Diagnosis not present

## 2019-05-18 DIAGNOSIS — E0590 Thyrotoxicosis, unspecified without thyrotoxic crisis or storm: Secondary | ICD-10-CM | POA: Diagnosis not present

## 2019-05-18 DIAGNOSIS — E042 Nontoxic multinodular goiter: Secondary | ICD-10-CM | POA: Diagnosis not present

## 2019-05-28 DIAGNOSIS — E042 Nontoxic multinodular goiter: Secondary | ICD-10-CM | POA: Diagnosis not present

## 2019-05-30 DIAGNOSIS — Z1231 Encounter for screening mammogram for malignant neoplasm of breast: Secondary | ICD-10-CM | POA: Diagnosis not present

## 2019-05-30 DIAGNOSIS — R29890 Loss of height: Secondary | ICD-10-CM | POA: Diagnosis not present

## 2019-05-30 DIAGNOSIS — M8589 Other specified disorders of bone density and structure, multiple sites: Secondary | ICD-10-CM | POA: Diagnosis not present

## 2019-05-30 DIAGNOSIS — Z803 Family history of malignant neoplasm of breast: Secondary | ICD-10-CM | POA: Diagnosis not present

## 2019-05-30 DIAGNOSIS — Z90710 Acquired absence of both cervix and uterus: Secondary | ICD-10-CM | POA: Diagnosis not present

## 2019-05-30 DIAGNOSIS — Z8262 Family history of osteoporosis: Secondary | ICD-10-CM | POA: Diagnosis not present

## 2019-05-30 DIAGNOSIS — R634 Abnormal weight loss: Secondary | ICD-10-CM | POA: Diagnosis not present

## 2019-05-30 LAB — HM DEXA SCAN

## 2019-05-30 LAB — HM MAMMOGRAPHY

## 2019-06-04 ENCOUNTER — Encounter: Payer: Self-pay | Admitting: Family Medicine

## 2019-06-05 ENCOUNTER — Encounter: Payer: Self-pay | Admitting: Family Medicine
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
## 2019-06-11 ENCOUNTER — Ambulatory Visit (INDEPENDENT_AMBULATORY_CARE_PROVIDER_SITE_OTHER): Payer: Medicare Other | Admitting: Family Medicine

## 2019-06-11 ENCOUNTER — Encounter: Payer: Self-pay | Admitting: Family Medicine

## 2019-06-11 ENCOUNTER — Other Ambulatory Visit: Payer: Self-pay

## 2019-06-11 VITALS — BP 124/72 | HR 71 | Temp 98.4°F | Resp 16 | Ht 61.0 in | Wt 150.0 lb

## 2019-06-11 DIAGNOSIS — Z1211 Encounter for screening for malignant neoplasm of colon: Secondary | ICD-10-CM | POA: Diagnosis not present

## 2019-06-11 DIAGNOSIS — E611 Iron deficiency: Secondary | ICD-10-CM | POA: Diagnosis not present

## 2019-06-11 DIAGNOSIS — M8589 Other specified disorders of bone density and structure, multiple sites: Secondary | ICD-10-CM | POA: Diagnosis not present

## 2019-06-11 DIAGNOSIS — R7303 Prediabetes: Secondary | ICD-10-CM | POA: Diagnosis not present

## 2019-06-11 DIAGNOSIS — E559 Vitamin D deficiency, unspecified: Secondary | ICD-10-CM

## 2019-06-11 DIAGNOSIS — Z13220 Encounter for screening for lipoid disorders: Secondary | ICD-10-CM

## 2019-06-11 DIAGNOSIS — Z0000 Encounter for general adult medical examination without abnormal findings: Secondary | ICD-10-CM

## 2019-06-11 LAB — CBC
HCT: 43.7 % (ref 36.0–46.0)
Hemoglobin: 14.5 g/dL (ref 12.0–15.0)
MCHC: 33.2 g/dL (ref 30.0–36.0)
MCV: 91.9 fl (ref 78.0–100.0)
Platelets: 300 10*3/uL (ref 150.0–400.0)
RBC: 4.75 Mil/uL (ref 3.87–5.11)
RDW: 13.9 % (ref 11.5–15.5)
WBC: 5.3 10*3/uL (ref 4.0–10.5)

## 2019-06-11 LAB — COMPREHENSIVE METABOLIC PANEL
ALT: 16 U/L (ref 0–35)
AST: 16 U/L (ref 0–37)
Albumin: 4.2 g/dL (ref 3.5–5.2)
Alkaline Phosphatase: 69 U/L (ref 39–117)
BUN: 12 mg/dL (ref 6–23)
CO2: 28 mEq/L (ref 19–32)
Calcium: 9.4 mg/dL (ref 8.4–10.5)
Chloride: 106 mEq/L (ref 96–112)
Creatinine, Ser: 0.71 mg/dL (ref 0.40–1.20)
GFR: 81.03 mL/min (ref >60.00)
Glucose, Bld: 93 mg/dL (ref 70–99)
Potassium: 4.5 mEq/L (ref 3.5–5.1)
Sodium: 142 mEq/L (ref 135–145)
Total Bilirubin: 0.4 mg/dL (ref 0.2–1.2)
Total Protein: 6.2 g/dL (ref 6.0–8.3)

## 2019-06-11 LAB — LIPID PANEL
Cholesterol: 201 mg/dL — ABNORMAL HIGH (ref 0–200)
HDL: 70.4 mg/dL (ref >39.00)
LDL Cholesterol: 110 mg/dL — ABNORMAL HIGH (ref 0–99)
NonHDL: 131.06
Total CHOL/HDL Ratio: 3
Triglycerides: 103 mg/dL (ref 0.0–149.0)
VLDL: 20.6 mg/dL (ref 0.0–40.0)

## 2019-06-11 LAB — VITAMIN D 25 HYDROXY (VIT D DEFICIENCY, FRACTURES): VITD: 49.21 ng/mL (ref 30.00–100.00)

## 2019-06-11 LAB — FERRITIN: Ferritin: 12.9 ng/mL (ref 10.0–291.0)

## 2019-06-11 LAB — HEMOGLOBIN A1C: Hgb A1c MFr Bld: 5.6 % (ref 4.6–6.5)

## 2019-06-11 NOTE — Patient Instructions (Addendum)
It was great to see you today!   I will be in touch with your labs.  We will check on your vitamin D level today Please use calcium and vitamin D supplementation, and we can repeat your bone density in one year  We will set you up to see Dr. Pyrtle for your colonoscopy Please call Dr. Hewitt's office to schedule an appt for your ankle-   Address: [address], [place],  [address] Phone: [phone]   Health Maintenance for Postmenopausal Women Menopause is a normal process in which your reproductive ability comes to an end. This process happens gradually over a span of months to years, usually between the ages of 48 and 55. Menopause is complete when you have missed 12 consecutive menstrual periods. It is important to talk with your health care provider about some of the most common conditions that affect postmenopausal women, such as heart disease, cancer, and bone loss (osteoporosis). Adopting a healthy lifestyle and getting preventive care can help to promote your health and wellness. Those actions can also lower your chances of developing some of these common conditions. What should I know about menopause? During menopause, you may experience a number of symptoms, such as:  Moderate-to-severe hot flashes.  Night sweats.  Decrease in sex drive.  Mood swings.  Headaches.  Tiredness.  Irritability.  Memory problems.  Insomnia. Choosing to treat or not to treat menopausal changes is an individual decision that you make with your health care provider. What should I know about hormone replacement therapy and supplements? Hormone therapy products are effective for treating symptoms that are associated with menopause, such as hot flashes and night sweats. Hormone replacement carries certain risks, especially as you become older. If you are thinking about using estrogen or estrogen with progestin treatments, discuss the benefits and risks with your health care  provider. What should I know about heart disease and stroke? Heart disease, heart attack, and stroke become more likely as you age. This may be due, in part, to the hormonal changes that your body experiences during menopause. These can affect how your body processes dietary fats, triglycerides, and cholesterol. Heart attack and stroke are both medical emergencies. There are many things that you can do to help prevent heart disease and stroke:  Have your blood pressure checked at least every 1-2 years. High blood pressure causes heart disease and increases the risk of stroke.  If you are 55-79 years old, ask your health care provider if you should take aspirin to prevent a heart attack or a stroke.  Do not use any tobacco products, including cigarettes, chewing tobacco, or electronic cigarettes. If you need help quitting, ask your health care provider.  It is important to eat a healthy diet and maintain a healthy weight. ? Be sure to include plenty of vegetables, fruits, low-fat dairy products, and lean protein. ? Avoid eating foods that are high in solid fats, added sugars, or salt (sodium).  Get regular exercise. This is one of the most important things that you can do for your health. ? Try to exercise for at least 150 minutes each week. The type of exercise that you do should increase your heart rate and make you sweat. This is known as moderate-intensity exercise. ? Try to do strengthening exercises at least twice each week. Do these in addition to the moderate-intensity exercise.  Know your numbers.Ask your health care provider to check your cholesterol and your blood glucose. Continue to have   your blood tested as directed by your health care provider.  What should I know about cancer screening? There are several types of cancer. Take the following steps to reduce your risk and to catch any cancer development as early as possible. Breast Cancer  Practice breast self-awareness. ? This  means understanding how your breasts normally appear and feel. ? It also means doing regular breast self-exams. Let your health care provider know about any changes, no matter how small.  If you are 36 or older, have a clinician do a breast exam (clinical breast exam or CBE) every year. Depending on your age, family history, and medical history, it may be recommended that you also have a yearly breast X-ray (mammogram).  If you have a family history of breast cancer, talk with your health care provider about genetic screening.  If you are at high risk for breast cancer, talk with your health care provider about having an MRI and a mammogram every year.  Breast cancer (BRCA) gene test is recommended for women who have family members with BRCA-related cancers. Results of the assessment will determine the need for genetic counseling and BRCA1 and for BRCA2 testing. BRCA-related cancers include these types: ? Breast. This occurs in males or females. ? Ovarian. ? Tubal. This may also be called fallopian tube cancer. ? Cancer of the abdominal or pelvic lining (peritoneal cancer). ? Prostate. ? Pancreatic. Cervical, Uterine, and Ovarian Cancer Your health care provider may recommend that you be screened regularly for cancer of the pelvic organs. These include your ovaries, uterus, and vagina. This screening involves a pelvic exam, which includes checking for microscopic changes to the surface of your cervix (Pap test).  For women ages 21-65, health care providers may recommend a pelvic exam and a Pap test every three years. For women ages 22-65, they may recommend the Pap test and pelvic exam, combined with testing for human papilloma virus (HPV), every five years. Some types of HPV increase your risk of cervical cancer. Testing for HPV may also be done on women of any age who have unclear Pap test results.  Other health care providers may not recommend any screening for nonpregnant women who are  considered low risk for pelvic cancer and have no symptoms. Ask your health care provider if a screening pelvic exam is right for you.  If you have had past treatment for cervical cancer or a condition that could lead to cancer, you need Pap tests and screening for cancer for at least 20 years after your treatment. If Pap tests have been discontinued for you, your risk factors (such as having a new sexual partner) need to be reassessed to determine if you should start having screenings again. Some women have medical problems that increase the chance of getting cervical cancer. In these cases, your health care provider may recommend that you have screening and Pap tests more often.  If you have a family history of uterine cancer or ovarian cancer, talk with your health care provider about genetic screening.  If you have vaginal bleeding after reaching menopause, tell your health care provider.  There are currently no reliable tests available to screen for ovarian cancer. Lung Cancer Lung cancer screening is recommended for adults 16-36 years old who are at high risk for lung cancer because of a history of smoking. A yearly low-dose CT scan of the lungs is recommended if you:  Currently smoke.  Have a history of at least 30 pack-years of smoking and  you currently smoke or have quit within the past 15 years. A pack-year is smoking an average of one pack of cigarettes per day for one year. Yearly screening should:  Continue until it has been 15 years since you quit.  Stop if you develop a health problem that would prevent you from having lung cancer treatment. Colorectal Cancer  This type of cancer can be detected and can often be prevented.  Routine colorectal cancer screening usually begins at age 50 and continues through age 75.  If you have risk factors for colon cancer, your health care provider may recommend that you be screened at an earlier age.  If you have a family history of  colorectal cancer, talk with your health care provider about genetic screening.  Your health care provider may also recommend using home test kits to check for hidden blood in your stool.  A small camera at the end of a tube can be used to examine your colon directly (sigmoidoscopy or colonoscopy). This is done to check for the earliest forms of colorectal cancer.  Direct examination of the colon should be repeated every 5-10 years until age 75. However, if early forms of precancerous polyps or small growths are found or if you have a family history or genetic risk for colorectal cancer, you may need to be screened more often. Skin Cancer  Check your skin from head to toe regularly.  Monitor any moles. Be sure to tell your health care provider: ? About any new moles or changes in moles, especially if there is a change in a mole's shape or color. ? If you have a mole that is larger than the size of a pencil eraser.  If any of your family members has a history of skin cancer, especially at a young age, talk with your health care provider about genetic screening.  Always use sunscreen. Apply sunscreen liberally and repeatedly throughout the day.  Whenever you are outside, protect yourself by wearing long sleeves, pants, a wide-brimmed hat, and sunglasses. What should I know about osteoporosis? Osteoporosis is a condition in which bone destruction happens more quickly than new bone creation. After menopause, you may be at an increased risk for osteoporosis. To help prevent osteoporosis or the bone fractures that can happen because of osteoporosis, the following is recommended:  If you are 19-50 years old, get at least 1,000 mg of calcium and at least 600 mg of vitamin D per day.  If you are older than age 50 but younger than age 70, get at least 1,200 mg of calcium and at least 600 mg of vitamin D per day.  If you are older than age 70, get at least 1,200 mg of calcium and at least 800 mg of  vitamin D per day. Smoking and excessive alcohol intake increase the risk of osteoporosis. Eat foods that are rich in calcium and vitamin D, and do weight-bearing exercises several times each week as directed by your health care provider. What should I know about how menopause affects my mental health? Depression may occur at any age, but it is more common as you become older. Common symptoms of depression include:  Low or sad mood.  Changes in sleep patterns.  Changes in appetite or eating patterns.  Feeling an overall lack of motivation or enjoyment of activities that you previously enjoyed.  Frequent crying spells. Talk with your health care provider if you think that you are experiencing depression. What should I know about immunizations?   It is important that you get and maintain your immunizations. These include:  Tetanus, diphtheria, and pertussis (Tdap) booster vaccine.  Influenza every year before the flu season begins.  Pneumonia vaccine.  Shingles vaccine. Your health care provider may also recommend other immunizations. This information is not intended to replace advice given to you by your health care provider. Make sure you discuss any questions you have with your health care provider. Document Released: 02/04/2006 Document Revised: 07/02/2016 Document Reviewed: 09/16/2015 Elsevier Interactive Patient Education  2019 [employer].

## 2019-07-10 ENCOUNTER — Encounter: Payer: Self-pay | Admitting: Family Medicine

## 2019-07-10 DIAGNOSIS — Z20822 Contact with and (suspected) exposure to covid-19: Secondary | ICD-10-CM

## 2019-07-10 DIAGNOSIS — Z20828 Contact with and (suspected) exposure to other viral communicable diseases: Secondary | ICD-10-CM

## 2019-07-11 ENCOUNTER — Other Ambulatory Visit: Payer: Self-pay | Admitting: Internal Medicine

## 2019-07-11 ENCOUNTER — Other Ambulatory Visit: Payer: Self-pay | Admitting: Family Medicine

## 2019-07-11 DIAGNOSIS — Z20822 Contact with and (suspected) exposure to covid-19: Secondary | ICD-10-CM

## 2019-07-14 LAB — NOVEL CORONAVIRUS, NAA: SARS-CoV-2, NAA: NOT DETECTED

## 2019-08-21 ENCOUNTER — Encounter: Payer: Self-pay | Admitting: Family Medicine

## 2019-08-21 MED ORDER — FLUTICASONE-SALMETEROL 250-50 MCG/DOSE IN AEPB: INHALATION_SPRAY | RESPIRATORY_TRACT | 5 refills | Status: DC

## 2019-09-04 ENCOUNTER — Other Ambulatory Visit: Payer: Self-pay | Admitting: Family Medicine

## 2019-10-19 ENCOUNTER — Encounter: Payer: Self-pay | Admitting: Gastroenterology

## 2019-10-22 ENCOUNTER — Ambulatory Visit (INDEPENDENT_AMBULATORY_CARE_PROVIDER_SITE_OTHER): Payer: Medicare Other | Admitting: Psychology

## 2019-10-22 DIAGNOSIS — F4323 Adjustment disorder with mixed anxiety and depressed mood: Secondary | ICD-10-CM

## 2019-11-09 NOTE — Progress Notes (Signed)
Virtual Visit via Video Note  I connected with patient on 11/12/19 at  9:00 AM EST by audio enabled telemedicine application and verified that I am speaking with the correct person using two identifiers.   THIS ENCOUNTER IS A VIRTUAL VISIT DUE TO COVID-19 - PATIENT WAS NOT SEEN IN THE OFFICE. PATIENT HAS CONSENTED TO VIRTUAL VISIT / TELEMEDICINE VISIT   Location of patient: home  Location of provider: office  I discussed the limitations of evaluation and management by telemedicine and the availability of in person appointments. The patient expressed understanding and agreed to proceed.   Subjective:   Kristina Mann is a 73 y.o. female who presents for Medicare Annual (Subsequent) preventive examination.  Review of Systems:    Home Safety/Smoke Alarms: Feels safe in home. Smoke alarms in place.  Lives w/ husband in 2 story home. No issues navigating stairs.    Female:         Mammo- 05/30/19       Dexa scan- 05/30/19       CCS- 11/08/14. 5 yr recall. Pt states she received letter stating next due 2022.    Objective:     Vitals: Unable to assess. This visit is enabled though telemedicine due to Covid 19.   Advanced Directives 11/12/2019 11/07/2018 11/08/2014 10/24/2014  Does Patient Have a Medical Advance Directive? Yes No No Yes  Type of Paramedic of [place];Living will - - Living will;Healthcare Power of Attorney  Does patient want to make changes to medical advance directive? No - Patient declined - - -  Copy of [place] in Chart? Yes - validated most recent copy scanned in chart (See row information) - - -  Would patient like information on creating a medical advance directive? - Yes (MAU/Ambulatory/Procedural Areas - Information given) No - patient declined information -    Tobacco Social History   Tobacco Use  Smoking Status Never Smoker  Smokeless Tobacco Never Used     Counseling given: Not Answered   Clinical  Intake: Pain : No/denies pain   Past Medical History:  Diagnosis Date  . Basal cell cancer 06/2005   Patient denies any cancer  . Bronchitis   . Chicken pox   . GERD (gastroesophageal reflux disease)   . Hypertension   . 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 Sarcoidosis   . Seasonal allergies   . Thyroid dysfunction     NODULE ABN CELLS 2006   Past Surgical History:  Procedure Laterality Date  . ABDOMINAL HYSTERECTOMY  1992  . APPENDECTOMY  1978  . CARPAL TUNNEL RELEASE Left 1979  . CARPAL TUNNEL RELEASE Right 1980  . [place]  . TIBIA FRACTURE SURGERY Left 2009   had 6 surgeries to repair and debride; and skin graft  . TONSILLECTOMY AND ADENOIDECTOMY  1953  . TUBAL LIGATION  1983  . 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 Diabetes Mother   . Cancer Mother        BREAST, THYROID  . Dementia Mother   . Heart disease Father        MI X3  . Cancer Father        lung  . COPD Father   . Hypertension Sister   . Hypertension Brother   . Hypertension Son        mild   . Colon cancer Neg Hx    Social History  Socioeconomic History  . Marital status: Married    Spouse name: Mallie Mussel  . Number of children: 3  . Years of education: 76  . Highest education level: Not on file  Occupational History  . Occupation: Therapist, art  Social Needs  . Financial resource strain: Not on file  . Food insecurity    Worry: Not on file    Inability: Not on file  . Transportation needs    Medical: Not on file    Non-medical: Not on file  Tobacco Use  . Smoking status: Never Smoker  . Smokeless tobacco: Never Used  Substance and Sexual Activity  . Alcohol use: Yes    Alcohol/week: 0.0 - 1.0 standard drinks  . Drug use: No  . Sexual activity: Not Currently  Lifestyle  . Physical activity    Days per week: Not on file    Minutes per session: Not on file  . Stress: Not on file  Relationships  . Social Product manager on phone: Not on file    Gets together: Not on file    Attends religious service: Not on file    Active member of club or organization: Not on file    Attends meetings of clubs or organizations: Not on file    Relationship status: Not on file  Other Topics Concern  . Not on file  Social History Narrative   O+ BLOOD DONATES Q 3 MONTHS.  EXERCISE-WALKS.   Mother in an assisted living facility in [place], where the patient's sister lives.   The patient lives with her husband.  Her son lives in [place], [place].   One daughter lives in [place], the other in [place]. Patient does exercise.    Outpatient Encounter Medications as of 11/12/2019  Medication Sig  . aspirin 81 MG chewable tablet Chew 81 mg by mouth daily.  . calcium-vitamin D 250-100 MG-UNIT per tablet Take 1 tablet by mouth 2 (two) times daily.  . Fluticasone-Salmeterol (ADVAIR DISKUS) 250-50 MCG/DOSE AEPB TAKE 1 PUFF BY MOUTH TWICE A DAY  . Multiple Vitamin (MULTIVITAMIN) capsule Take 1 capsule by mouth daily.  . sodium chloride (OCEAN) 0.65 % SOLN nasal spray Place 1 spray into both nostrils as needed for congestion.  . [DISCONTINUED] chlorthalidone (HYGROTON) 25 MG tablet TAKE 1/2 TABLET BY MOUTH EVERY DAY   No facility-administered encounter medications on file as of 11/12/2019.     Activities of Daily Living In your present state of health, do you have any difficulty performing the following activities: 11/12/2019  Hearing? N  Vision? N  Difficulty concentrating or making decisions? N  Walking or climbing stairs? N  Dressing or bathing? N  Doing errands, shopping? N  Preparing Food and eating ? N  Using the Toilet? N  In the past six months, have you accidently leaked urine? N  Do you have problems with loss of bowel control? N  Managing your Medications? N  Managing your Finances? N  Housekeeping or managing your Housekeeping? N  Some recent data might be hidden    Patient Care  Team: Copland, Gay Filler, MD as PCP - General (Family Medicine) Amalia Greenhouse, MD (Endocrinology) Sharyne Peach, MD (Ophthalmology) Hurley Cisco, MD (Internal Medicine) Jovita Gamma, MD (Neurosurgery) Ladell Pier, MD (Internal Medicine)    Assessment:   This is a routine wellness examination for Kristina Mann. Physical assessment deferred to PCP.  Exercise Activities and Dietary recommendations Current Exercise Habits: Home exercise routine, Time (Minutes): 30, Frequency (Times/Week):  4, Weekly Exercise (Minutes/Week): 120, Intensity: Mild Diet (meal preparation, eat out, water intake, caffeinated beverages, dairy products, fruits and vegetables): in general, a "healthy" diet  , well balanced       Goals    . Patient Stated     Maintain healthy active lifestyle.       Fall Risk Fall Risk  11/12/2019 11/14/2018 11/07/2018 08/11/2017 03/17/2016  Falls in the past year? 0 0 0 No No  Comment - Emmi Telephone Survey: data to providers prior to load - - -  Number falls in past yr: 0 - - - -  Injury with Fall? 0 - - - -    Depression Screen PHQ 2/9 Scores 11/12/2019 11/07/2018 08/11/2017 03/17/2016  PHQ - 2 Score 1 0 0 0  Exception Documentation - - Patient refusal -     Cognitive Function Ad8 score reviewed for issues:  Issues making decisions:no  Less interest in hobbies / activities:no  Repeats questions, stories (family complaining):no  Trouble using ordinary gadgets (microwave, computer, phone):no  Forgets the month or year: no  Mismanaging finances: no  Remembering appts:no  Daily problems with thinking and/or memory:no Ad8 score is=0         Immunization History  Administered Date(s) Administered  . Influenza Whole 11/06/2011  . Influenza, High Dose Seasonal PF 12/13/2016, 12/14/2017, 10/25/2018  . Influenza, Seasonal, Injecte, Preservative Fre 02/19/2013  . Influenza,inj,Quad PF,6+ Mos 12/29/2013, 09/16/2014, 09/17/2015  . Pneumococcal  Conjugate-13 09/16/2014  . Pneumococcal Polysaccharide-23 09/26/2004, 03/17/2015  . Tdap 01/13/2011  . Zoster 03/17/2016   Screening Tests Health Maintenance  Topic Date Due  . URINE MICROALBUMIN  11/30/1956  . INFLUENZA VACCINE  07/28/2019  . COLONOSCOPY  11/09/2019  . TETANUS/TDAP  01/13/2021  . MAMMOGRAM  05/29/2021  . DEXA SCAN  Completed  . Hepatitis C Screening  Completed  . PNA vac Low Risk Adult  Completed       Plan:   See you next year!  Continue to eat heart healthy diet (full of fruits, vegetables, whole grains, lean protein, water--limit salt, fat, and sugar intake) and increase physical activity as tolerated.  Continue doing brain stimulating activities (puzzles, reading, adult coloring books, staying active) to keep memory sharp.     I have personally reviewed and noted the following in the patient's chart:   . Medical and social history . Use of alcohol, tobacco or illicit drugs  . Current medications and supplements . Functional ability and status . Nutritional status . Physical activity . Advanced directives . List of other physicians . Hospitalizations, surgeries, and ER visits in previous 12 months . Vitals . Screenings to include cognitive, depression, and falls . Referrals and appointments  In addition, I have reviewed and discussed with patient certain preventive protocols, quality metrics, and best practice recommendations. A written personalized care plan for preventive services as well as general preventive health recommendations were provided to patient.     Shela Nevin, [place]  11/12/2019

## 2019-11-12 ENCOUNTER — Encounter: Payer: Self-pay | Admitting: *Deleted

## 2019-11-12 ENCOUNTER — Ambulatory Visit (INDEPENDENT_AMBULATORY_CARE_PROVIDER_SITE_OTHER): Payer: Medicare Other | Admitting: *Deleted

## 2019-11-12 ENCOUNTER — Other Ambulatory Visit: Payer: Self-pay

## 2019-11-12 DIAGNOSIS — Z0000 Encounter for general adult medical examination without abnormal findings: Secondary | ICD-10-CM | POA: Diagnosis not present

## 2019-11-12 NOTE — Patient Instructions (Signed)
See you next year!  Continue to eat heart healthy diet (full of fruits, vegetables, whole grains, lean protein, water--limit salt, fat, and sugar intake) and increase physical activity as tolerated.  Continue doing brain stimulating activities (puzzles, reading, adult coloring books, staying active) to keep memory sharp.    Kristina Mann , Thank you for taking time to come for your Medicare Wellness Visit. I appreciate your ongoing commitment to your health goals. Please review the following plan we discussed and let me know if I can assist you in the future.   These are the goals we discussed: Goals    . Patient Stated     Maintain healthy active lifestyle.       This is a list of the screening recommended for you and due dates:  Health Maintenance  Topic Date Due  . Urine Protein Check  11/30/1956  . Flu Shot  07/28/2019  . Colon Cancer Screening  11/09/2019  . Tetanus Vaccine  01/13/2021  . Mammogram  05/29/2021  . DEXA scan (bone density measurement)  Completed  .  Hepatitis C: One time screening is recommended by Center for Disease Control  (CDC) for  adults born from [birth year] through 1964.   Completed  . Pneumonia vaccines  Completed    Preventive Care 72 Years and Older, Female Preventive care refers to lifestyle choices and visits with your health care provider that can promote health and wellness. This includes:  A yearly physical exam. This is also called an annual well check.  Regular dental and eye exams.  Immunizations.  Screening for certain conditions.  Healthy lifestyle choices, such as diet and exercise. What can I expect for my preventive care visit? Physical exam Your health care provider will check:  Height and weight. These may be used to calculate body mass index (BMI), which is a measurement that tells if you are at a healthy weight.  Heart rate and blood pressure.  Your skin for abnormal spots. Counseling Your health care provider may ask you  questions about:  Alcohol, tobacco, and drug use.  Emotional well-being.  Home and relationship well-being.  Sexual activity.  Eating habits.  History of falls.  Memory and ability to understand (cognition).  Work and work Statistician.  Pregnancy and menstrual history. What immunizations do I need?  Influenza (flu) vaccine  This is recommended every year. Tetanus, diphtheria, and pertussis (Tdap) vaccine  You may need a Td booster every 10 years. Varicella (chickenpox) vaccine  You may need this vaccine if you have not already been vaccinated. Zoster (shingles) vaccine  You may need this after age 50. Pneumococcal conjugate (PCV13) vaccine  One dose is recommended after age 72. Pneumococcal polysaccharide (PPSV23) vaccine  One dose is recommended after age 72. Measles, mumps, and rubella (MMR) vaccine  You may need at least one dose of MMR if you were born in 1957 or later. You may also need a second dose. Meningococcal conjugate (MenACWY) vaccine  You may need this if you have certain conditions. Hepatitis A vaccine  You may need this if you have certain conditions or if you travel or work in places where you may be exposed to hepatitis A. Hepatitis B vaccine  You may need this if you have certain conditions or if you travel or work in places where you may be exposed to hepatitis B. Haemophilus influenzae type b (Hib) vaccine  You may need this if you have certain conditions. You may receive vaccines as individual doses or  as more than one vaccine together in one shot (combination vaccines). Talk with your health care provider about the risks and benefits of combination vaccines. What tests do I need? Blood tests  Lipid and cholesterol levels. These may be checked every 5 years, or more frequently depending on your overall health.  Hepatitis C test.  Hepatitis B test. Screening  Lung cancer screening. You may have this screening every year starting at  age 72 if you have a 30-pack-year history of smoking and currently smoke or have quit within the past 15 years.  Colorectal cancer screening. All adults should have this screening starting at age 72 and continuing until age 73. Your health care provider may recommend screening at age 72 if you are at increased risk. You will have tests every 1-10 years, depending on your results and the type of screening test.  Diabetes screening. This is done by checking your blood sugar (glucose) after you have not eaten for a while (fasting). You may have this done every 1-3 years.  Mammogram. This may be done every 1-2 years. Talk with your health care provider about how often you should have regular mammograms.  BRCA-related cancer screening. This may be done if you have a family history of breast, ovarian, tubal, or peritoneal cancers. Other tests  Sexually transmitted disease (STD) testing.  Bone density scan. This is done to screen for osteoporosis. You may have this done starting at age 72. Follow these instructions at home: Eating and drinking  Eat a diet that includes fresh fruits and vegetables, whole grains, lean protein, and low-fat dairy products. Limit your intake of foods with high amounts of sugar, saturated fats, and salt.  Take vitamin and mineral supplements as recommended by your health care provider.  Do not drink alcohol if your health care provider tells you not to drink.  If you drink alcohol: ? Limit how much you have to 0-1 drink a day. ? Be aware of how much alcohol is in your drink. In the [place] one drink equals one 12 oz bottle of beer (355 mL), one 5 oz glass of wine (148 mL), or one 1 oz glass of hard liquor (44 mL). Lifestyle  Take daily care of your teeth and gums.  Stay active. Exercise for at least 30 minutes on 5 or more days each week.  Do not use any products that contain nicotine or tobacco, such as cigarettes, e-cigarettes, and chewing tobacco. If you need  help quitting, ask your health care provider.  If you are sexually active, practice safe sex. Use a condom or other form of protection in order to prevent STIs (sexually transmitted infections).  Talk with your health care provider about taking a low-dose aspirin or statin. What's next?  Go to your health care provider once a year for a well check visit.  Ask your health care provider how often you should have your eyes and teeth checked.  Stay up to date on all vaccines. This information is not intended to replace advice given to you by your health care provider. Make sure you discuss any questions you have with your health care provider. Document Released: 01/09/2016 Document Revised: 12/07/2018 Document Reviewed: 12/07/2018 Elsevier Patient Education  2020 [employer].

## 2019-11-13 ENCOUNTER — Ambulatory Visit (INDEPENDENT_AMBULATORY_CARE_PROVIDER_SITE_OTHER): Payer: Medicare Other | Admitting: Psychology

## 2019-11-13 DIAGNOSIS — F4323 Adjustment disorder with mixed anxiety and depressed mood: Secondary | ICD-10-CM | POA: Diagnosis not present

## 2019-11-20 ENCOUNTER — Ambulatory Visit (INDEPENDENT_AMBULATORY_CARE_PROVIDER_SITE_OTHER): Payer: Medicare Other

## 2019-11-20 ENCOUNTER — Other Ambulatory Visit: Payer: Self-pay

## 2019-11-20 DIAGNOSIS — Z23 Encounter for immunization: Secondary | ICD-10-CM

## 2019-11-21 ENCOUNTER — Other Ambulatory Visit: Payer: Self-pay

## 2019-11-29 ENCOUNTER — Ambulatory Visit (INDEPENDENT_AMBULATORY_CARE_PROVIDER_SITE_OTHER): Payer: Medicare Other | Admitting: Psychology

## 2019-11-29 DIAGNOSIS — F4323 Adjustment disorder with mixed anxiety and depressed mood: Secondary | ICD-10-CM | POA: Diagnosis not present

## 2019-12-11 NOTE — Progress Notes (Addendum)
[facility] at [facility] [address] [place], [place] [address] [phone]  Date:  12/12/2019   Name:  Kristina Mann   DOB:  02/05/1946   MRN:  [id number]  PCP:  Darreld Mclean, MD    Chief Complaint: Hypertension and Prediabetes   History of Present Illness:  Kristina Mann is a 73 y.o. very pleasant female patient who presents with the following:  Today for 29-month follow-up visit Patient with history of prediabetes, hypertension, sarcoidosis osteopenia, subclinical hyperthyroidism, chronic left ankle swelling due to fracture 2009 Last seen by myself in June for physical  Dr. Tamala Julian at [facility] manages her thyroid Dr. Estanislado Pandy manages her sarcoidosis-she has generally asymptomatic is exposed to allergens or pollen She does use Advair daily- she is not having to follow-up for her sarcoidosis recently as her sx are mild She may have to change to [place] for insurance change next year   Status post abdominal hysterectomy in the 1990s  Married to [place]- he had angioplasty about one year ago and is doing well  He saw his cardiologist yesterday   Colon cancer screening; she got a letter from GI that she is now not due until 2022 Mammogram up-to-date Bone density earlier this year Full labs completed in June  Lab Results  Component Value Date   HGBA1C 5.6 06/11/2019   Aspirin Advair Immunizations are up-to-date She would like to get the covid vaccine when it is available   She saw Dr Doran Durand for her ankle issue a couple of year ago  She would like to see him again to discuss any options that might exist - ? Ankle replacmement She has limited ROM of her ankle and it does hold her back from being as active as she would like  Her daughter work in Information systems manager and functional health" and has requested that I get several labs today   She is taking potassium/ vit D and ferritin   Wt Readings from Last 3 Encounters:   12/12/19 147 lb 3.2 oz (66.8 kg)  06/11/19 150 lb (68 kg)  01/16/19 146 lb (66.2 kg)     Patient Active Problem List   Diagnosis Date Noted  . Osteopenia 03/30/2017  . Trigger finger, acquired 01/18/2017  . Multinodular goiter 05/12/2016  . Subclinical hyperthyroidism 05/12/2016  . Pre-diabetes 06/16/2015  . Low back pain 11/20/2013  . Seasonal allergies 01/20/2012  . Thyroid nodule 01/20/2012  . Sarcoidosis 01/20/2012  . HTN (hypertension) 01/20/2012  . Basal cell cancer 01/20/2012    Past Medical History:  Diagnosis Date  . Basal cell cancer 06/2005   Patient denies any cancer  . Bronchitis   . Chicken pox   . GERD (gastroesophageal reflux disease)   . Hypertension   . 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 Sarcoidosis   . Seasonal allergies   . Thyroid dysfunction     NODULE ABN CELLS 2006    Past Surgical History:  Procedure Laterality Date  . ABDOMINAL HYSTERECTOMY  1992  . APPENDECTOMY  1978  . CARPAL TUNNEL RELEASE Left 1979  . CARPAL TUNNEL RELEASE Right 1980  . [place]  . TIBIA FRACTURE SURGERY Left 2009   had 6 surgeries to repair and debride; and skin graft  . TONSILLECTOMY AND ADENOIDECTOMY  1953  . TUBAL LIGATION  1983  . 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 Smoking status:  Never Smoker  . Smokeless tobacco: Never Used  Substance Use Topics  . Alcohol use: Yes    Alcohol/week: 0.0 - 1.0 standard drinks  . Drug use: No    Family History  Problem Relation Age of Onset  . Diabetes Mother   . Cancer Mother        BREAST, THYROID  . Dementia Mother   . Heart disease Father        MI X3  . Cancer Father        lung  . COPD Father   . Hypertension Sister   . Hypertension Brother   . Hypertension Son        mild   . Colon cancer Neg Hx     Allergies  Allergen Reactions  . Codeine Nausea And Vomiting    Medication list has been reviewed and updated.  Current Outpatient Medications on  File Prior to Visit  Medication Sig Dispense Refill  . aspirin 81 MG chewable tablet Chew 81 mg by mouth daily.    . calcium-vitamin D 250-100 MG-UNIT per tablet Take 1 tablet by mouth 2 (two) times daily.    . Fluticasone-Salmeterol (ADVAIR DISKUS) 250-50 MCG/DOSE AEPB TAKE 1 PUFF BY MOUTH TWICE A DAY 60 each 5  . Multiple Vitamin (MULTIVITAMIN) capsule Take 1 capsule by mouth daily.    . sodium chloride (OCEAN) 0.65 % SOLN nasal spray Place 1 spray into both nostrils as needed for congestion. 15 mL 0   No current facility-administered medications on file prior to visit.    Review of Systems:  As per HPI- otherwise negative. No fever or chills   Physical Examination: Vitals:   12/12/19 0854  BP: 130/70  Pulse: 70  Resp: 18  Temp: (!) 96.4 F (35.8 C)  SpO2: 98%   Vitals:   12/12/19 0854  Weight: 147 lb 3.2 oz (66.8 kg)  Height: 5\' 1"  (1.549 m)   Body mass index is 27.81 kg/m. Ideal Body Weight: Weight in (lb) to have BMI = 25: 132  GEN: WDWN, NAD, Non-toxic, A & O x 3, mild overweight, looks well  HEENT: Atraumatic, Normocephalic. Neck supple. No masses, No LAD. Ears and Nose: No external deformity. CV: RRR, No M/G/R. No JVD. No thrill. No extra heart sounds. PULM: CTA B, no wheezes, crackles, rhonchi. No retractions. No resp. distress. No accessory muscle use. ABD: S, NT, ND, +BS. No rebound. No HSM. EXTR: No c/c/e Her left ankle continues to have limited flexion and extension NEURO Normal gait.  PSYCH: Normally interactive. Conversant. Not depressed or anxious appearing.  Calm demeanor.    Assessment and Plan: Chronic pain of left ankle - Plan: Ambulatory referral to Orthopedic Surgery  Iron deficiency - Plan: CBC, Ferritin  Pre-diabetes - Plan: Comprehensive metabolic panel, Hemoglobin A1c  Screening for hyperlipidemia - Plan: Lipid panel  Vitamin D deficiency - Plan: Vitamin D (25 hydroxy)  Subclinical hyperthyroidism - Plan: TSH, T3, reverse, Thyroid  peroxidase antibody  Following up today Referral to ortho to talk about her ankle Labs pending as above for health maint Plan to visit in 6 months Discussed colon cancer screening and shingles vaccine to be done at a later date    This visit occurred during the SARS-CoV-2 public health emergency.  Safety protocols were in place, including screening questions prior to the visit, additional usage of staff PPE, and extensive cleaning of exam room while observing appropriate contact time as indicated for disinfecting solutions.    Signed Janett Billow  Shronda Boeh, MD Received her labs as below, message to patient  Results for orders placed or performed in visit on 12/12/19  CBC  Result Value Ref Range   WBC 5.5 4.0 - 10.5 K/uL   RBC 4.77 3.87 - 5.11 Mil/uL   Platelets 295.0 150.0 - 400.0 K/uL   Hemoglobin 14.4 12.0 - 15.0 g/dL   HCT 43.8 36.0 - 46.0 %   MCV 92.0 78.0 - 100.0 fl   MCHC 32.8 30.0 - 36.0 g/dL   RDW 14.0 11.5 - 15.5 %  Comprehensive metabolic panel  Result Value Ref Range   Sodium 141 135 - 145 mEq/L   Potassium 4.5 3.5 - 5.1 mEq/L   Chloride 106 96 - 112 mEq/L   CO2 30 19 - 32 mEq/L   Glucose, Bld 102 (H) 70 - 99 mg/dL   BUN 17 6 - 23 mg/dL   Creatinine, Ser 0.77 0.40 - 1.20 mg/dL   Total Bilirubin 0.4 0.2 - 1.2 mg/dL   Alkaline Phosphatase 80 39 - 117 U/L   AST 15 0 - 37 U/L   ALT 12 0 - 35 U/L   Total Protein 6.4 6.0 - 8.3 g/dL   Albumin 4.2 3.5 - 5.2 g/dL   GFR 73.68 >60.00 mL/min   Calcium 9.3 8.4 - 10.5 mg/dL  Lipid panel  Result Value Ref Range   Cholesterol 213 (H) 0 - 200 mg/dL   Triglycerides 69.0 0.0 - 149.0 mg/dL   HDL 75.30 >39.00 mg/dL   VLDL 13.8 0.0 - 40.0 mg/dL   LDL Cholesterol 124 (H) 0 - 99 mg/dL   Total CHOL/HDL Ratio 3    NonHDL 138.04   TSH  Result Value Ref Range   TSH 0.70 0.35 - 4.50 uIU/mL  Ferritin  Result Value Ref Range   Ferritin 14.9 10.0 - 291.0 ng/mL  Vitamin D (25 hydroxy)  Result Value Ref Range   VITD 41.87 30.00 -  100.00 ng/mL  Hemoglobin A1c  Result Value Ref Range   Hgb A1c MFr Bld 5.6 4.6 - 6.5 %

## 2019-12-11 NOTE — Patient Instructions (Addendum)
It was great to see you again today, happy holidays  I will be in touch with your labs and will set you up to see Dr Doran Durand again to discuss your ankle   Please let me know if you need me to change your [place] or change rx

## 2019-12-12 ENCOUNTER — Ambulatory Visit (INDEPENDENT_AMBULATORY_CARE_PROVIDER_SITE_OTHER): Payer: Medicare Other | Admitting: Family Medicine

## 2019-12-12 ENCOUNTER — Encounter: Payer: Self-pay | Admitting: Family Medicine

## 2019-12-12 ENCOUNTER — Other Ambulatory Visit: Payer: Self-pay

## 2019-12-12 VITALS — BP 130/70 | HR 70 | Temp 96.4°F | Resp 18 | Ht 61.0 in | Wt 147.2 lb

## 2019-12-12 DIAGNOSIS — Z13220 Encounter for screening for lipoid disorders: Secondary | ICD-10-CM

## 2019-12-12 DIAGNOSIS — R7303 Prediabetes: Secondary | ICD-10-CM | POA: Diagnosis not present

## 2019-12-12 DIAGNOSIS — E611 Iron deficiency: Secondary | ICD-10-CM | POA: Diagnosis not present

## 2019-12-12 DIAGNOSIS — G8929 Other chronic pain: Secondary | ICD-10-CM | POA: Diagnosis not present

## 2019-12-12 DIAGNOSIS — E0590 Thyrotoxicosis, unspecified without thyrotoxic crisis or storm: Secondary | ICD-10-CM

## 2019-12-12 DIAGNOSIS — E559 Vitamin D deficiency, unspecified: Secondary | ICD-10-CM

## 2019-12-12 DIAGNOSIS — M25572 Pain in left ankle and joints of left foot: Secondary | ICD-10-CM | POA: Diagnosis not present

## 2019-12-12 LAB — VITAMIN D 25 HYDROXY (VIT D DEFICIENCY, FRACTURES): VITD: 41.87 ng/mL (ref 30.00–100.00)

## 2019-12-12 LAB — COMPREHENSIVE METABOLIC PANEL
ALT: 12 U/L (ref 0–35)
AST: 15 U/L (ref 0–37)
Albumin: 4.2 g/dL (ref 3.5–5.2)
Alkaline Phosphatase: 80 U/L (ref 39–117)
BUN: 17 mg/dL (ref 6–23)
CO2: 30 mEq/L (ref 19–32)
Calcium: 9.3 mg/dL (ref 8.4–10.5)
Chloride: 106 mEq/L (ref 96–112)
Creatinine, Ser: 0.77 mg/dL (ref 0.40–1.20)
GFR: 73.68 mL/min (ref >60.00)
Glucose, Bld: 102 mg/dL — ABNORMAL HIGH (ref 70–99)
Potassium: 4.5 mEq/L (ref 3.5–5.1)
Sodium: 141 mEq/L (ref 135–145)
Total Bilirubin: 0.4 mg/dL (ref 0.2–1.2)
Total Protein: 6.4 g/dL (ref 6.0–8.3)

## 2019-12-12 LAB — CBC
HCT: 43.8 % (ref 36.0–46.0)
Hemoglobin: 14.4 g/dL (ref 12.0–15.0)
MCHC: 32.8 g/dL (ref 30.0–36.0)
MCV: 92 fl (ref 78.0–100.0)
Platelets: 295 10*3/uL (ref 150.0–400.0)
RBC: 4.77 Mil/uL (ref 3.87–5.11)
RDW: 14 % (ref 11.5–15.5)
WBC: 5.5 10*3/uL (ref 4.0–10.5)

## 2019-12-12 LAB — HEMOGLOBIN A1C: Hgb A1c MFr Bld: 5.6 % (ref 4.6–6.5)

## 2019-12-12 LAB — LIPID PANEL
Cholesterol: 213 mg/dL — ABNORMAL HIGH (ref 0–200)
HDL: 75.3 mg/dL (ref >39.00)
LDL Cholesterol: 124 mg/dL — ABNORMAL HIGH (ref 0–99)
NonHDL: 138.04
Total CHOL/HDL Ratio: 3
Triglycerides: 69 mg/dL (ref 0.0–149.0)
VLDL: 13.8 mg/dL (ref 0.0–40.0)

## 2019-12-12 LAB — FERRITIN: Ferritin: 14.9 ng/mL (ref 10.0–291.0)

## 2019-12-12 LAB — TSH: TSH: 0.7 u[IU]/mL (ref 0.35–4.50)

## 2019-12-16 LAB — THYROID PEROXIDASE ANTIBODY: Thyroperoxidase Ab SerPl-aCnc: <1 IU/mL (ref <9)

## 2019-12-16 LAB — T3, REVERSE: T3, Reverse: 15 ng/dL (ref 8–25)

## 2019-12-18 LAB — HM DIABETES EYE EXAM

## 2020-01-10 ENCOUNTER — Encounter: Payer: Self-pay | Admitting: Family Medicine

## 2020-01-11 ENCOUNTER — Ambulatory Visit: Payer: Medicare Other | Attending: Internal Medicine

## 2020-01-11 DIAGNOSIS — Z20822 Contact with and (suspected) exposure to covid-19: Secondary | ICD-10-CM | POA: Diagnosis not present

## 2020-01-12 LAB — NOVEL CORONAVIRUS, NAA: SARS-CoV-2, NAA: NOT DETECTED

## 2020-01-16 ENCOUNTER — Ambulatory Visit: Payer: Medicare Other | Attending: Internal Medicine

## 2020-01-16 DIAGNOSIS — Z20822 Contact with and (suspected) exposure to covid-19: Secondary | ICD-10-CM | POA: Diagnosis not present

## 2020-01-17 LAB — NOVEL CORONAVIRUS, NAA: SARS-CoV-2, NAA: NOT DETECTED

## 2020-01-25 ENCOUNTER — Encounter: Payer: Self-pay | Admitting: Family Medicine

## 2020-01-25 DIAGNOSIS — M19172 Post-traumatic osteoarthritis, left ankle and foot: Secondary | ICD-10-CM | POA: Diagnosis not present

## 2020-01-25 DIAGNOSIS — M25572 Pain in left ankle and joints of left foot: Secondary | ICD-10-CM | POA: Insufficient documentation

## 2020-01-25 DIAGNOSIS — T849XXA Unspecified complication of internal orthopedic prosthetic device, implant and graft, initial encounter: Secondary | ICD-10-CM | POA: Insufficient documentation

## 2020-01-25 DIAGNOSIS — T8484XA Pain due to internal orthopedic prosthetic devices, implants and grafts, initial encounter: Secondary | ICD-10-CM | POA: Diagnosis not present

## 2020-01-25 DIAGNOSIS — D869 Sarcoidosis, unspecified: Secondary | ICD-10-CM

## 2020-01-25 MED ORDER — FLUTICASONE-SALMETEROL 250-50 MCG/DOSE IN AEPB: 1.0000 | INHALATION_SPRAY | Freq: Two times a day (BID) | RESPIRATORY_TRACT | 11 refills | Status: DC

## 2020-02-21 ENCOUNTER — Ambulatory Visit: Payer: Medicare Other | Attending: Internal Medicine

## 2020-02-21 DIAGNOSIS — Z23 Encounter for immunization: Secondary | ICD-10-CM

## 2020-02-21 NOTE — Progress Notes (Signed)
   Covid-19 Vaccination Clinic  Name:  Kristina Mann    MRN: [id number] DOB: 1946/09/07  02/21/2020  [name] was observed post Covid-19 immunization for 15 minutes without incidence. She was provided with Vaccine Information Sheet and instruction to access the V-Safe system.   [name] was instructed to call 911 with any severe reactions post vaccine: Marland Kitchen Difficulty breathing  . Swelling of your face and throat  . A fast heartbeat  . A bad rash all over your body  . Dizziness and weakness    Immunizations Administered    Name Date Dose VIS Date Route   Pfizer COVID-19 Vaccine 02/21/2020  8:36 AM 0.3 mL 12/07/2019 Intramuscular   Manufacturer: [place]   Lot: [id number]   [place]: [id number]

## 2020-03-12 ENCOUNTER — Ambulatory Visit: Payer: Medicare Other | Attending: Internal Medicine

## 2020-03-12 DIAGNOSIS — Z23 Encounter for immunization: Secondary | ICD-10-CM

## 2020-03-12 NOTE — Progress Notes (Signed)
   Covid-19 Vaccination Clinic  Name:  Kristina Mann    MRN: [id number] DOB: 03-27-1946  03/12/2020  [name] was observed post Covid-19 immunization for 15 minutes without incident. She was provided with Vaccine Information Sheet and instruction to access the V-Safe system.   [name] was instructed to call 911 with any severe reactions post vaccine: Marland Kitchen Difficulty breathing  . Swelling of face and throat  . A fast heartbeat  . A bad rash all over body  . Dizziness and weakness   Immunizations Administered    Name Date Dose VIS Date Route   Pfizer COVID-19 Vaccine 03/12/2020 11:08 AM 0.3 mL 12/07/2019 Intramuscular   Manufacturer: [place]   Lot: [id number]   [place]: [id number]

## 2020-04-14 DIAGNOSIS — M19172 Post-traumatic osteoarthritis, left ankle and foot: Secondary | ICD-10-CM | POA: Diagnosis not present

## 2020-04-14 DIAGNOSIS — M19179 Post-traumatic osteoarthritis, unspecified ankle and foot: Secondary | ICD-10-CM | POA: Insufficient documentation

## 2020-05-20 DIAGNOSIS — E042 Nontoxic multinodular goiter: Secondary | ICD-10-CM | POA: Diagnosis not present

## 2020-05-20 DIAGNOSIS — E0590 Thyrotoxicosis, unspecified without thyrotoxic crisis or storm: Secondary | ICD-10-CM | POA: Diagnosis not present

## 2020-05-20 DIAGNOSIS — I10 Essential (primary) hypertension: Secondary | ICD-10-CM | POA: Diagnosis not present

## 2020-06-05 DIAGNOSIS — Z1231 Encounter for screening mammogram for malignant neoplasm of breast: Secondary | ICD-10-CM | POA: Diagnosis not present

## 2020-06-08 NOTE — Patient Instructions (Addendum)
It was great to see you again today!   I will be in touch with your labs Please stop by [facility] at your convenience for your chest film  [place], [place], [place] [address]  You can walk in during typical business hours for your film   Please see me in about 6 months

## 2020-06-08 NOTE — Progress Notes (Addendum)
[facility] [address], [place], [place] [address] [phone]  Date:  06/11/2020   Name:  Kristina Mann   DOB:  01-03-1946   MRN:  [id number]  PCP:  Darreld Mclean, MD    Chief Complaint: Hypertension (6 month follow up), Pre-Diabetes, and Colonoscopy (advised to wait two more years)   History of Present Illness:  Kristina Mann is a 74 y.o. very pleasant female patient who presents with the following:  Here today for 30-month follow-up visit Last seen by myself in December Patient with history of prediabetes, hypertension, sarcoidosis osteopenia, subclinical hyperthyroidism, chronic left ankle swelling due to fracture 2009.  Her ankle problem does make it harder for her to be as active as she would like She has seen Dr. Doran Durand for consultation regarding her ankle Her sx here are intermittent.  They have discussed fusion/ replacement, or cortisone injections For the time being her ankle symptoms are manageable, she has elected to defer any more aggressive treatment  Dr. Tamala [facility] at [facility] manages her thyroid-last visit in May.  However pt reports she has ben released from his care, would like me to check her thyroid possible Ultrasound last year showed stable thyroid nodules for 5 years, no repeat ultrasound necessary symptoms develop Dr. Estanislado Pandy manages her sarcoidosis-she has generally asymptomatic is exposed to allergens or pollen.  Patient has not noticed any change or worsening of her symptoms.  She would like to do a chest x-ray as it has been about 6 years  Her daughter is involved with health and nutrition therapy.  She has requested a few labs be drawn for her mom today  Married to [employer]  Colon cancer screening- per pt she was told that she is not due until 2023 Mammogram 1 year ago- she did her mammo last week, request report from [facility] 1 year ago Labs done in December- she would like to do some  labs today as well   Baby aspirin wixela inhaler  Wt Readings from Last 3 Encounters:  06/11/20 151 lb (68.5 kg)  12/12/19 147 lb 3.2 oz (66.8 kg)  06/11/19 150 lb (68 kg)     Patient Active Problem List   Diagnosis Date Noted  . Osteopenia 03/30/2017  . Trigger finger, acquired 01/18/2017  . Multinodular goiter 05/12/2016  . Subclinical hyperthyroidism 05/12/2016  . Pre-diabetes 06/16/2015  . Low back pain 11/20/2013  . Seasonal allergies 01/20/2012  . Thyroid nodule 01/20/2012  . Sarcoidosis 01/20/2012  . HTN (hypertension) 01/20/2012  . Basal cell cancer 01/20/2012    Past Medical History:  Diagnosis Date  . Basal cell cancer 06/2005   Patient denies any cancer  . Bronchitis   . Chicken pox   . GERD (gastroesophageal reflux disease)   . Hypertension   . 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 Sarcoidosis   . Seasonal allergies   . Thyroid dysfunction     NODULE ABN CELLS 2006    Past Surgical History:  Procedure Laterality Date  . ABDOMINAL HYSTERECTOMY  1992  . APPENDECTOMY  1978  . CARPAL TUNNEL RELEASE Left 1979  . CARPAL TUNNEL RELEASE Right 1980  . [place]  . TIBIA FRACTURE SURGERY Left 2009   had 6 surgeries to repair and debride; and skin graft  . TONSILLECTOMY AND ADENOIDECTOMY  1953  . TUBAL LIGATION  1983  . 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 Smoking status: Never Smoker  . Smokeless tobacco: Never Used  Vaping Use  . Vaping Use: Never used  Substance Use Topics  . Alcohol use: Yes    Alcohol/week: 0.0 - 1.0 standard drinks  . Drug use: No    Family History  Problem Relation Age of Onset  . Diabetes Mother   . Cancer Mother        BREAST, THYROID  . Dementia Mother   . Heart disease Father        MI X3  . Cancer Father        lung  . COPD Father   . Hypertension Sister   . Hypertension Brother   . Hypertension Son        mild   . Colon cancer Neg Hx     Allergies  Allergen  Reactions  . Codeine Nausea And Vomiting    Medication list has been reviewed and updated.  Current Outpatient Medications on File Prior to Visit  Medication Sig Dispense Refill  . aspirin 81 MG chewable tablet Chew 81 mg by mouth daily.    . calcium-vitamin D 250-100 MG-UNIT per tablet Take 1 tablet by mouth 2 (two) times daily.    . Fluticasone-Salmeterol (WIXELA INHUB) 250-50 MCG/DOSE AEPB Inhale 1 puff into the lungs 2 (two) times daily. 60 each 11  . Multiple Vitamin (MULTIVITAMIN) capsule Take 1 capsule by mouth daily.    . sodium chloride (OCEAN) 0.65 % SOLN nasal spray Place 1 spray into both nostrils as needed for congestion. 15 mL 0   No current facility-administered medications on file prior to visit.    Review of Systems:  As per HPI- otherwise negative.   Physical Examination: Vitals:   06/11/20 0844  BP: 122/70  Pulse: 65  Resp: 17  Temp: (!) 97.1 F (36.2 C)  SpO2: 97%   Vitals:   06/11/20 0844  Weight: 151 lb (68.5 kg)  Height: 5\' 1"  (1.549 m)   Body mass index is 28.53 kg/m. Ideal Body Weight: Weight in (lb) to have BMI = 25: 132  GEN: no acute distress.  Looks well, normal weight for age  4: Atraumatic, Normocephalic.  Ears and Nose: No external deformity. CV: RRR, No M/G/R. No JVD. No thrill. No extra heart sounds. PULM: CTA B, no wheezes, crackles, rhonchi. No retractions. No resp. distress. No accessory muscle use. ABD: S, NT, ND, +BS. No rebound. No HSM. EXTR: No c/c/e PSYCH: Normally interactive. Conversant.  Left ankle s/p operative repair and skin grafting    Assessment and Plan: Sarcoidosis - Plan: DG Chest 2 View  Pre-diabetes - Plan: Hemoglobin A1c  Chronic pain of left ankle - Plan: C-reactive protein, VITAMIN D 25 Hydroxy (Vit-D Deficiency, Fractures)  Iron deficiency - Plan: CBC, Ferritin  Essential hypertension, benign - Plan: Comprehensive metabolic panel  Subclinical hyperthyroidism - Plan: TSH, T3, free, T4, free,  T3, reverse, Thyroid peroxidase antibody  Screening for hyperlipidemia - Plan: Lipid panel  Vitamin D deficiency - Plan: VITAMIN D 25 Hydroxy (Vit-D Deficiency, Fractures)  Following up today for routine health maintenance Blood pressure under good control Labs pending as above Kristina Mann is working on being more physically active and is watching her diet.  Congratulated her on her efforts Colonoscopy is delayed per GI due to updated recommendations Ordered a chest film to monitor her sarcoidosis, she will have this done at her convenience  Plan to check back in 6 months assuming  labs are okay This visit occurred during the SARS-CoV-2 public health emergency.  Safety protocols were in place, including screening questions prior to the visit, additional usage of staff PPE, and extensive cleaning of exam room while observing appropriate contact time as indicated for disinfecting solutions.    Signed Lamar Blinks, MD  Received her labs as follows. Message to pt  Results for orders placed or performed in visit on 06/11/20  CBC  Result Value Ref Range   WBC 5.2 4.0 - 10.5 K/uL   RBC 4.45 3.87 - 5.11 Mil/uL   Platelets 287.0 150 - 400 K/uL   Hemoglobin 13.0 12.0 - 15.0 g/dL   HCT 38.6 36 - 46 %   MCV 86.8 78.0 - 100.0 fl   MCHC 33.7 30.0 - 36.0 g/dL   RDW 14.1 11.5 - 15.5 %  Comprehensive metabolic panel  Result Value Ref Range   Sodium 142 135 - 145 mEq/L   Potassium 4.3 3.5 - 5.1 mEq/L   Chloride 107 96 - 112 mEq/L   CO2 28 19 - 32 mEq/L   Glucose, Bld 94 70 - 99 mg/dL   BUN 12 6 - 23 mg/dL   Creatinine, Ser 0.75 0.40 - 1.20 mg/dL   Total Bilirubin 0.4 0.2 - 1.2 mg/dL   Alkaline Phosphatase 102 39 - 117 U/L   AST 17 0 - 37 U/L   ALT 14 0 - 35 U/L   Total Protein 6.4 6.0 - 8.3 g/dL   Albumin 4.3 3.5 - 5.2 g/dL   GFR 75.85 >60.00 mL/min   Calcium 9.4 8.4 - 10.5 mg/dL  Lipid panel  Result Value Ref Range   Cholesterol 199 0 - 200 mg/dL   Triglycerides 75.0 0 - 149 mg/dL    HDL 84.80 >39.00 mg/dL   VLDL 15.0 0.0 - 40.0 mg/dL   LDL Cholesterol 99 0 - 99 mg/dL   Total CHOL/HDL Ratio 2    NonHDL 113.76   TSH  Result Value Ref Range   TSH 0.83 0.35 - 4.50 uIU/mL  T3, free  Result Value Ref Range   T3, Free 3.7 2.3 - 4.2 pg/mL  T4, free  Result Value Ref Range   Free T4 0.81 0.60 - 1.60 ng/dL  C-reactive protein  Result Value Ref Range   CRP <1.0 0.5 - 20.0 mg/dL  VITAMIN D 25 Hydroxy (Vit-D Deficiency, Fractures)  Result Value Ref Range   VITD 54.23 30.00 - 100.00 ng/mL  Ferritin  Result Value Ref Range   Ferritin 5.9 (L) 10.0 - 291.0 ng/mL  Hemoglobin A1c  Result Value Ref Range   Hgb A1c MFr Bld 6.1 4.6 - 6.5 %

## 2020-06-11 ENCOUNTER — Encounter: Payer: Self-pay | Admitting: Family Medicine

## 2020-06-11 ENCOUNTER — Ambulatory Visit (INDEPENDENT_AMBULATORY_CARE_PROVIDER_SITE_OTHER): Payer: Medicare Other | Admitting: Family Medicine

## 2020-06-11 ENCOUNTER — Other Ambulatory Visit: Payer: Self-pay

## 2020-06-11 VITALS — BP 122/70 | HR 65 | Temp 97.1°F | Resp 17 | Ht 61.0 in | Wt 151.0 lb

## 2020-06-11 DIAGNOSIS — E559 Vitamin D deficiency, unspecified: Secondary | ICD-10-CM

## 2020-06-11 DIAGNOSIS — Z13220 Encounter for screening for lipoid disorders: Secondary | ICD-10-CM

## 2020-06-11 DIAGNOSIS — I10 Essential (primary) hypertension: Secondary | ICD-10-CM

## 2020-06-11 DIAGNOSIS — E0590 Thyrotoxicosis, unspecified without thyrotoxic crisis or storm: Secondary | ICD-10-CM

## 2020-06-11 DIAGNOSIS — E611 Iron deficiency: Secondary | ICD-10-CM | POA: Diagnosis not present

## 2020-06-11 DIAGNOSIS — M25572 Pain in left ankle and joints of left foot: Secondary | ICD-10-CM

## 2020-06-11 DIAGNOSIS — R7303 Prediabetes: Secondary | ICD-10-CM | POA: Diagnosis not present

## 2020-06-11 DIAGNOSIS — G8929 Other chronic pain: Secondary | ICD-10-CM

## 2020-06-11 DIAGNOSIS — D869 Sarcoidosis, unspecified: Secondary | ICD-10-CM | POA: Diagnosis not present

## 2020-06-11 LAB — CBC
HCT: 38.6 % (ref 36.0–46.0)
Hemoglobin: 13 g/dL (ref 12.0–15.0)
MCHC: 33.7 g/dL (ref 30.0–36.0)
MCV: 86.8 fl (ref 78.0–100.0)
Platelets: 287 10*3/uL (ref 150.0–400.0)
RBC: 4.45 Mil/uL (ref 3.87–5.11)
RDW: 14.1 % (ref 11.5–15.5)
WBC: 5.2 10*3/uL (ref 4.0–10.5)

## 2020-06-11 LAB — TSH: TSH: 0.83 u[IU]/mL (ref 0.35–4.50)

## 2020-06-11 LAB — T4, FREE: Free T4: 0.81 ng/dL (ref 0.60–1.60)

## 2020-06-11 LAB — LIPID PANEL
Cholesterol: 199 mg/dL (ref 0–200)
HDL: 84.8 mg/dL (ref >39.00)
LDL Cholesterol: 99 mg/dL (ref 0–99)
NonHDL: 113.76
Total CHOL/HDL Ratio: 2
Triglycerides: 75 mg/dL (ref 0.0–149.0)
VLDL: 15 mg/dL (ref 0.0–40.0)

## 2020-06-11 LAB — COMPREHENSIVE METABOLIC PANEL
ALT: 14 U/L (ref 0–35)
AST: 17 U/L (ref 0–37)
Albumin: 4.3 g/dL (ref 3.5–5.2)
Alkaline Phosphatase: 102 U/L (ref 39–117)
BUN: 12 mg/dL (ref 6–23)
CO2: 28 mEq/L (ref 19–32)
Calcium: 9.4 mg/dL (ref 8.4–10.5)
Chloride: 107 mEq/L (ref 96–112)
Creatinine, Ser: 0.75 mg/dL (ref 0.40–1.20)
GFR: 75.85 mL/min (ref >60.00)
Glucose, Bld: 94 mg/dL (ref 70–99)
Potassium: 4.3 mEq/L (ref 3.5–5.1)
Sodium: 142 mEq/L (ref 135–145)
Total Bilirubin: 0.4 mg/dL (ref 0.2–1.2)
Total Protein: 6.4 g/dL (ref 6.0–8.3)

## 2020-06-11 LAB — HEMOGLOBIN A1C: Hgb A1c MFr Bld: 6.1 % (ref 4.6–6.5)

## 2020-06-11 LAB — VITAMIN D 25 HYDROXY (VIT D DEFICIENCY, FRACTURES): VITD: 54.23 ng/mL (ref 30.00–100.00)

## 2020-06-11 LAB — T3, FREE: T3, Free: 3.7 pg/mL (ref 2.3–4.2)

## 2020-06-11 LAB — C-REACTIVE PROTEIN: CRP: <1 mg/dL (ref 0.5–20.0)

## 2020-06-11 LAB — FERRITIN: Ferritin: 5.9 ng/mL — ABNORMAL LOW (ref 10.0–291.0)

## 2020-06-15 LAB — T3, REVERSE: T3, Reverse: 12 ng/dL (ref 8–25)

## 2020-06-15 LAB — THYROID PEROXIDASE ANTIBODY: Thyroperoxidase Ab SerPl-aCnc: <1 IU/mL (ref <9)

## 2020-07-29 ENCOUNTER — Encounter: Payer: Self-pay | Admitting: Family Medicine

## 2020-07-29 ENCOUNTER — Other Ambulatory Visit: Payer: Self-pay

## 2020-07-29 ENCOUNTER — Ambulatory Visit
Admission: RE | Admit: 2020-07-29 | Discharge: 2020-07-29 | Disposition: A | Discharge status: Home / Self Care | Payer: Medicare Other | Source: Ambulatory Visit | Attending: Family Medicine | Admitting: Family Medicine

## 2020-07-29 DIAGNOSIS — D869 Sarcoidosis, unspecified: Secondary | ICD-10-CM

## 2020-07-29 DIAGNOSIS — Z8709 Personal history of other diseases of the respiratory system: Secondary | ICD-10-CM | POA: Diagnosis not present

## 2020-09-29 DIAGNOSIS — Z23 Encounter for immunization: Secondary | ICD-10-CM | POA: Diagnosis not present

## 2020-10-08 LAB — IRON,TIBC AND FERRITIN PANEL
%SAT: 10
Ferritin: 9
Iron: 46
TIBC: 444
UIBC: 398

## 2020-10-08 LAB — CBC AND DIFFERENTIAL
HCT: 39 (ref 36–46)
Hemoglobin: 12.5 (ref 12.0–16.0)
Platelets: 350 (ref 150–399)
WBC: 6.7

## 2020-10-08 LAB — VITAMIN B12: Vitamin B-12: 1938

## 2020-10-08 LAB — CBC: RBC: 4.78 (ref 3.87–5.11)

## 2020-10-14 NOTE — Patient Instructions (Addendum)
Good to see you again today!  I will set you up to see hematology to discuss your iron deficiency. This may or may not be causing your fatigue.  However, they may suggest treating either with IV or oral iron to see if it might help you  We will set you up for a treadmill test to make sure no evidence of cardiac disease as the cause of your fatigue.  If you don't hear about this appt in the next week or so please let me know  I think the lesion on your left inner cheek is a benign lesion likely due to biting your lip.  I would think your dentist can remove this, or they may refer you to see oral surgery You can tell them you have an approx 20mm diameter smooth, pedunculated , flesh colored lesion that appears benign

## 2020-10-14 NOTE — Progress Notes (Signed)
[facility] at [facility] [address], [place], [place] [address] [phone]  Date:  10/16/2020   Name:  Kristina Mann   DOB:  1946/06/30   MRN:  [id number]  PCP:  Darreld Mclean, MD    Chief Complaint: Anemia (patient would like to discuss anemia profile)   History of Present Illness:  Kristina Mann is a 74 y.o. very pleasant female patient who presents with the following:  Here today to discuss health concerns- last seen by myself in June History of prediabetes, hypertension, sarcoidosis, osteopenia, subclinical hyperthyroidism, chronic left ankle swelling due to fracture 2009.  Her ankle problem does make it harder for her to be as active as she would like  Pt notes she has felt tired and had some labs drawn recently which show a low ferritin level- however she is not anemic.  She has a copy of his lab work with her, per notes will be abstracted into chart. These labs were suggested and ordered by her daughter who does some work in nutrition She notes that exercise tolerance is decreased- she tends to do some light weights and then do the eliptical machine or bike for about 30 minutes.  Recently she feels like she gets tired out more easily while exercising, but does not have any CP or SOB  She did do a stress test perhaps 10 years ago due to palpitations- all turned out ok  She does not have any known history of cardiac disease  She does not feel motivated to do as much as she normally would She is sleeping at least 8 hours a night-  She gets up about once a night to urinate, she does not snore per her knowledge and feels that she sleeps well.  She tends to feel pretty good in the am  Dr Tamala Julian at [facility] is her endocrinologist for thyroid- she saw him earlier this year and all looked good  Deveshwar managed her sarcoidosis.  She is generally asymptomatic except when exposed to allergens.   She is not taking an oral iron right  now   Also, she recently bit the inside of her left cheek.  This healed, but then she developed a small growth in this area.  She would like me to look at it.  It is not painful  Married to [employer]  Flu vaccine- done covid done- complete Colon UTD mammo UTD dexa utd Patient Active Problem List   Diagnosis Date Noted  . Osteopenia 03/30/2017  . Trigger finger, acquired 01/18/2017  . Multinodular goiter 05/12/2016  . Subclinical hyperthyroidism 05/12/2016  . Pre-diabetes 06/16/2015  . Low back pain 11/20/2013  . Seasonal allergies 01/20/2012  . Thyroid nodule 01/20/2012  . Sarcoidosis 01/20/2012  . HTN (hypertension) 01/20/2012  . Basal cell cancer 01/20/2012    Past Medical History:  Diagnosis Date  . Basal cell cancer 06/2005   Patient denies any cancer  . Bronchitis   . Chicken pox   . GERD (gastroesophageal reflux disease)   . Hypertension   . 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 Sarcoidosis   . Seasonal allergies   . Thyroid dysfunction     NODULE ABN CELLS 2006    Past Surgical History:  Procedure Laterality Date  . ABDOMINAL HYSTERECTOMY  1992  . APPENDECTOMY  1978  . CARPAL TUNNEL RELEASE Left 1979  . CARPAL TUNNEL RELEASE Right 1980  . [place]  . TIBIA  FRACTURE SURGERY Left 2009   had 6 surgeries to repair and debride; and skin graft  . TONSILLECTOMY AND ADENOIDECTOMY  1953  . TUBAL LIGATION  1983  . 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 Smoking status: Never Smoker  . Smokeless tobacco: Never Used  Vaping Use  . Vaping Use: Never used  Substance Use Topics  . Alcohol use: Yes    Alcohol/week: 0.0 - 1.0 standard drinks  . Drug use: No    Family History  Problem Relation Age of Onset  . Diabetes Mother   . Cancer Mother        BREAST, THYROID  . Dementia Mother   . Heart disease Father        MI X3  . Cancer Father        lung  . COPD Father   . Hypertension Sister   . Hypertension Brother    . Hypertension Son        mild   . Colon cancer Neg Hx     Allergies  Allergen Reactions  . Codeine Nausea And Vomiting    Medication list has been reviewed and updated.  Current Outpatient Medications on File Prior to Visit  Medication Sig Dispense Refill  . aspirin 81 MG chewable tablet Chew 81 mg by mouth daily.    . calcium-vitamin D 250-100 MG-UNIT per tablet Take 1 tablet by mouth 2 (two) times daily.    . Fluticasone-Salmeterol (WIXELA INHUB) 250-50 MCG/DOSE AEPB Inhale 1 puff into the lungs 2 (two) times daily. 60 each 11  . Multiple Vitamin (MULTIVITAMIN) capsule Take 1 capsule by mouth daily.    . sodium chloride (OCEAN) 0.65 % SOLN nasal spray Place 1 spray into both nostrils as needed for congestion. 15 mL 0   No current facility-administered medications on file prior to visit.    Review of Systems:  As per HPI- otherwise negative.   Physical Examination: Vitals:   10/16/20 1034  BP: 120/62  Pulse: 64  Resp: 16  Temp: 98.9 F (37.2 C)  SpO2: 94%   Vitals:   10/16/20 1034  Weight: 151 lb 12.8 oz (68.9 kg)  Height: 5\' 1"  (1.549 m)   Body mass index is 28.68 kg/m. Ideal Body Weight: Weight in (lb) to have BMI = 25: 132  GEN: no acute distress.Minimal overweight, looks well HEENT: Atraumatic, Normocephalic.  Ears and Nose: No external deformity. CV: RRR, No M/G/R. No JVD. No thrill. No extra heart sounds. PULM: CTA B, no wheezes, crackles, rhonchi. No retractions. No resp. distress. No accessory muscle use. ABD: S, NT, ND, +BS. No rebound. No HSM. EXTR: No c/c/e PSYCH: Normally interactive. Conversant.  Left cheek lingual side displays a smooth, flesh-colored pedunculated lesion, about 5 mm in diameter.  Appears benign  EKG: SR, no concerning findings  c/w tracing from 2/14 no significant change noted  Assessment and Plan: Exercise intolerance - Plan: EKG 12-Lead, Exercise Tolerance Test  Iron deficiency - Plan: Ambulatory referral to  Hematology  Mouth lesion   Patient here today with a couple concerns.  She notes fatigue, wonders if this is due to iron deficiency.  She does have a low ferritin level on recent lab work.  I will refer her to hematology for evaluation and advice.  I have advised her however that though she does have low iron she is not anemic, I am not sure if her iron is actually causing her fatigue.  She has noticed a change in her exercise tolerance recently, though no chest pain or shortness of breath.  I will set her up for an exercise tolerance test to screen for CAD Likely benign lesion inside her mouth, though this probably should be removed.  I asked her to consult with her dentist This visit occurred during the SARS-CoV-2 public health emergency.  Safety protocols were in place, including screening questions prior to the visit, additional usage of staff PPE, and extensive cleaning of exam room while observing appropriate contact time as indicated for disinfecting solutions.    Signed Lamar Blinks, MD

## 2020-10-16 ENCOUNTER — Ambulatory Visit (INDEPENDENT_AMBULATORY_CARE_PROVIDER_SITE_OTHER): Payer: Medicare Other | Admitting: Family Medicine

## 2020-10-16 ENCOUNTER — Other Ambulatory Visit: Payer: Self-pay

## 2020-10-16 ENCOUNTER — Encounter: Payer: Self-pay | Admitting: Family Medicine

## 2020-10-16 ENCOUNTER — Telehealth: Payer: Self-pay | Admitting: Family

## 2020-10-16 VITALS — BP 120/62 | HR 64 | Temp 98.9°F | Resp 16 | Ht 61.0 in | Wt 151.8 lb

## 2020-10-16 DIAGNOSIS — R6889 Other general symptoms and signs: Secondary | ICD-10-CM | POA: Diagnosis not present

## 2020-10-16 DIAGNOSIS — K1370 Unspecified lesions of oral mucosa: Secondary | ICD-10-CM

## 2020-10-16 DIAGNOSIS — E611 Iron deficiency: Secondary | ICD-10-CM | POA: Diagnosis not present

## 2020-10-16 NOTE — Telephone Encounter (Signed)
Called and LMVM for patient about New patient Appointments that have bene scheduled. And advised her that I was sending her a NP Packet in the mail along with a schedule

## 2020-10-17 ENCOUNTER — Encounter: Payer: Self-pay | Admitting: Family Medicine

## 2020-10-18 ENCOUNTER — Encounter: Payer: Self-pay | Admitting: Family Medicine

## 2020-10-18 DIAGNOSIS — E611 Iron deficiency: Secondary | ICD-10-CM

## 2020-10-30 ENCOUNTER — Encounter (HOSPITAL_BASED_OUTPATIENT_CLINIC_OR_DEPARTMENT_OTHER): Payer: Self-pay

## 2020-10-30 ENCOUNTER — Other Ambulatory Visit: Payer: Self-pay

## 2020-10-30 ENCOUNTER — Emergency Department (HOSPITAL_BASED_OUTPATIENT_CLINIC_OR_DEPARTMENT_OTHER): Payer: Medicare Other

## 2020-10-30 ENCOUNTER — Emergency Department (HOSPITAL_BASED_OUTPATIENT_CLINIC_OR_DEPARTMENT_OTHER)
Admission: EM | Admit: 2020-10-30 | Discharge: 2020-10-30 | Disposition: A | Discharge status: Home / Self Care | Payer: Medicare Other | Attending: Emergency Medicine | Admitting: Emergency Medicine

## 2020-10-30 ENCOUNTER — Other Ambulatory Visit (HOSPITAL_BASED_OUTPATIENT_CLINIC_OR_DEPARTMENT_OTHER): Payer: Self-pay | Admitting: Emergency Medicine

## 2020-10-30 DIAGNOSIS — Z8553 Personal history of malignant neoplasm of renal pelvis: Secondary | ICD-10-CM | POA: Diagnosis not present

## 2020-10-30 DIAGNOSIS — I10 Essential (primary) hypertension: Secondary | ICD-10-CM | POA: Insufficient documentation

## 2020-10-30 DIAGNOSIS — Z7982 Long term (current) use of aspirin: Secondary | ICD-10-CM | POA: Insufficient documentation

## 2020-10-30 DIAGNOSIS — Z85828 Personal history of other malignant neoplasm of skin: Secondary | ICD-10-CM | POA: Diagnosis not present

## 2020-10-30 DIAGNOSIS — W19XXXA Unspecified fall, initial encounter: Secondary | ICD-10-CM

## 2020-10-30 DIAGNOSIS — W1830XA Fall on same level, unspecified, initial encounter: Secondary | ICD-10-CM | POA: Diagnosis not present

## 2020-10-30 DIAGNOSIS — S93401A Sprain of unspecified ligament of right ankle, initial encounter: Secondary | ICD-10-CM | POA: Diagnosis not present

## 2020-10-30 DIAGNOSIS — S8002XA Contusion of left knee, initial encounter: Secondary | ICD-10-CM

## 2020-10-30 DIAGNOSIS — S8992XA Unspecified injury of left lower leg, initial encounter: Secondary | ICD-10-CM | POA: Diagnosis present

## 2020-10-30 MED ORDER — TRAMADOL HCL 50 MG PO TABS
50.0000 mg | ORAL_TABLET | Freq: Four times a day (QID) | ORAL | 0 refills | Status: DC | PRN
Start: 2020-10-30 — End: 2020-12-17

## 2020-10-30 MED FILL — traMADol HCL 50 MG TABS: 50 | 4 days supply | Qty: 15 | Fill #0

## 2020-10-30 NOTE — ED Notes (Signed)
Missed step yesterday and twisted rt ankle  Blue and swollen

## 2020-10-30 NOTE — ED Provider Notes (Signed)
[facility] EMERGENCY DEPARTMENT Provider Note   CSN: [id number] Arrival date & time: 10/30/20  1228     History Chief Complaint  Patient presents with  . Fall    Kristina Mann is a 74 y.o. female.  Patient with a fall yesterday.  No syncope no loss of consciousness.  Did not hit her head.  Some mild injury to her left knee that she is not concerned about.  More concerned about the right ankle pain and swelling.  And bruising.  No hip complaints.  No back complaint or neck pain.        Past Medical History:  Diagnosis Date  . Basal cell cancer 06/2005   Patient denies any cancer  . Bronchitis   . Chicken pox   . GERD (gastroesophageal reflux disease)   . Hypertension   . 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 Sarcoidosis   . Seasonal allergies   . Thyroid dysfunction     NODULE ABN CELLS 2006    Patient Active Problem List   Diagnosis Date Noted  . Osteopenia 03/30/2017  . Trigger finger, acquired 01/18/2017  . Multinodular goiter 05/12/2016  . Subclinical hyperthyroidism 05/12/2016  . Pre-diabetes 06/16/2015  . Low back pain 11/20/2013  . Seasonal allergies 01/20/2012  . Thyroid nodule 01/20/2012  . Sarcoidosis 01/20/2012  . HTN (hypertension) 01/20/2012  . Basal cell cancer 01/20/2012    Past Surgical History:  Procedure Laterality Date  . ABDOMINAL HYSTERECTOMY  1992  . APPENDECTOMY  1978  . CARPAL TUNNEL RELEASE Left 1979  . CARPAL TUNNEL RELEASE Right 1980  . [place]  . TIBIA FRACTURE SURGERY Left 2009   had 6 surgeries to repair and debride; and skin graft  . TONSILLECTOMY AND ADENOIDECTOMY  1953  . TUBAL LIGATION  1983  . 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 Diabetes Mother   . Cancer Mother          BREAST, THYROID  . Dementia Mother   . Heart disease Father        MI X3  . Cancer Father        lung  . COPD Father   . Hypertension Sister   . Hypertension Brother   . Hypertension Son        mild   . Colon cancer Neg Hx     Social History   Tobacco Use  . Smoking status: Never Smoker  . Smokeless tobacco: Never Used  Vaping Use  . Vaping Use: Never used  Substance Use Topics  . Alcohol use: Yes    Alcohol/week: 0.0 - 1.0 standard drinks  . Drug use: No    Home Medications Prior to Admission medications   Medication Sig Start Date End Date Taking? Authorizing Provider  aspirin 81 MG chewable tablet Chew 81 mg by mouth daily.    [provider]  calcium-vitamin D 250-100 MG-UNIT per tablet Take 1 tablet by mouth 2 (two) times daily.    [provider]  Fluticasone-Salmeterol (WIXELA INHUB) 250-50 MCG/DOSE AEPB Inhale 1 puff into the lungs 2 (two) times daily. 01/25/20   Copland, Gay Filler, MD  Multiple Vitamin (MULTIVITAMIN) capsule Take 1 capsule by mouth daily.    [provider]  sodium chloride (OCEAN) 0.65 % SOLN nasal spray Place 1 spray into both nostrils as needed for congestion. 03/17/18   Nche, Charlene Brooke, NP  traMADol (ULTRAM) 50 MG tablet Take 1 tablet (50 mg total) by mouth every 6 (six) hours as needed. 10/30/20   Fredia Sorrow, MD    Allergies    Codeine  Review of Systems   Review of Systems  Constitutional: Negative for chills and fever.  HENT: Negative for congestion, rhinorrhea and sore throat.   Eyes: Negative for visual disturbance.  Respiratory: Negative for cough and shortness of breath.   Cardiovascular: Negative for chest pain and leg swelling.  Gastrointestinal: Negative for abdominal pain, diarrhea, nausea and vomiting.  Genitourinary: Negative for dysuria.  Musculoskeletal: Positive for joint swelling. Negative for back pain and neck pain.  Skin: Negative for rash.  Neurological: Negative for  dizziness, light-headedness and headaches.  Hematological: Does not bruise/bleed easily.  Psychiatric/Behavioral: Negative for confusion.    Physical Exam Updated Vital Signs BP 131/64 (BP Location: Right Arm)   Pulse 92   Temp 99.2 F (37.3 C) (Oral)   Resp 16   Ht 1.549 m (5\' 1" )   Wt 68.9 kg   SpO2 98%   BMI 28.68 kg/m   Physical Exam Vitals and nursing note reviewed.  Constitutional:      General: She is not in acute distress.    Appearance: Normal appearance. She is well-developed.  HENT:     Head: Normocephalic and atraumatic.  Eyes:     Conjunctiva/sclera: Conjunctivae normal.     Pupils: Pupils are equal, round, and reactive to light.  Cardiovascular:     Rate and Rhythm: Normal rate and regular rhythm.     Heart sounds: No murmur heard.   Pulmonary:     Effort: Pulmonary effort is normal. No respiratory distress.     Breath sounds: Normal breath sounds.  Abdominal:     Palpations: Abdomen is soft.     Tenderness: There is no abdominal tenderness.  Musculoskeletal:     Cervical back: Neck supple.     Comments: Swelling to the lateral aspect of the right ankle.  With bruising.  Dorsalis pedis pulses 1+.  Good cap refill.  Sensation intact.  No proximal tenderness.  Left knee without significant swelling.  Some pain with range of motion but not significant.  Patient does not want x-ray of that.  Skin:    General: Skin is warm and dry.     Capillary Refill: Capillary refill takes less than 2 seconds.  Neurological:     Mental Status: She is alert.     ED Results / Procedures / Treatments   Labs (all labs ordered are listed, but only abnormal results are displayed) Labs Reviewed - No data to display  EKG None  Radiology DG Ankle Complete Right  Result Date: 10/30/2020 CLINICAL DATA:  Severe diffuse right ankle swelling and bruising following an injury yesterday. EXAM: RIGHT ANKLE - COMPLETE 3+ VIEW COMPARISON:  None. FINDINGS: Diffuse soft tissue  swelling, most pronounced laterally. No fracture, dislocation or effusion seen. IMPRESSION: No fracture. Electronically Signed   By: Claudie Revering M.D.   On: 10/30/2020 13:12    Procedures Procedures (including critical care time)  Medications Ordered in ED Medications - No data to display  ED Course  I have reviewed the triage vital signs and the nursing notes.  Pertinent labs & imaging results that were available during my care of the patient were reviewed by me and considered in my medical decision making (see chart for details).    MDM Rules/Calculators/A&P                          X-ray of the right ankle without any bony abnormality.  Clinically ankle sprain.  Patient has a wrap from her previous injury.  That she has with her she will wrap the ankle.  Patient able to weight-bear and ambulate as tolerated.  She also has a crutch that she is used before.  Patient followed by Dr. Doran Durand from [facility].  We will have her call them for follow-up.  Patient prefers just tramadol for pain.  Patient did not want x-rays of the left knee.  She felt that that was fine.     Final Clinical Impression(s) / ED Diagnoses Final diagnoses:  Fall, initial encounter  Sprain of right ankle, unspecified ligament, initial encounter  Contusion of left knee, initial encounter    Rx / DC Orders ED Discharge Orders         Ordered    traMADol (ULTRAM) 50 MG tablet  Every 6 hours PRN        10/30/20 1413           Fredia Sorrow, MD 10/30/20 1413

## 2020-10-30 NOTE — Discharge Instructions (Addendum)
Can appointment to follow-up with orthopedics.  Weight-bear and ambulate as tolerated.  Take the tramadol as needed for pain.  Use your ankle support wrap and your crutch.

## 2020-10-30 NOTE — ED Triage Notes (Signed)
Pt arrives with pain to left knee and right ankle after a fall yesterday, denies hitting head, denies LOC, denies blood thinner. Pt right ankle is swollen and bruised.

## 2020-11-05 ENCOUNTER — Inpatient Hospital Stay: Payer: Medicare Other

## 2020-11-05 ENCOUNTER — Inpatient Hospital Stay: Payer: Medicare Other | Admitting: Family

## 2020-11-12 NOTE — Progress Notes (Signed)
Subjective:   Mizuki Hoel is a 74 y.o. female who presents for Medicare Annual (Subsequent) preventive examination.  Review of Systems     Cardiac Risk Factors include: advanced age (>79men, >47 women);hypertension     Objective:    Today's Vitals   11/13/20 0936 11/13/20 0939  BP: 136/70   Pulse: 79   Resp: 16   Temp: 98.3 F (36.8 C)   TempSrc: Oral   SpO2: 96%   Weight: 147 lb 12.8 oz (67 kg)   Height: 5\' 1"  (1.549 m)   PainSc:  3    Body mass index is 27.93 kg/m.  Advanced Directives 11/13/2020 10/30/2020 11/12/2019 11/07/2018 11/08/2014 10/24/2014  Does Patient Have a Medical Advance Directive? Yes Yes Yes No No Yes  Type of Paramedic of [place];Living will [place];Living will [place];Living will - - Living will;Healthcare Power of Attorney  Does patient want to make changes to medical advance directive? - No - Patient declined No - Patient declined - - -  Copy of [place] in Chart? Yes - validated most recent copy scanned in chart (See row information) No - copy requested Yes - validated most recent copy scanned in chart (See row information) - - -  Would patient like information on creating a medical advance directive? - - - Yes (MAU/Ambulatory/Procedural Areas - Information given) No - patient declined information -    Current Medications (verified) Outpatient Encounter Medications as of 11/13/2020  Medication Sig   aspirin 81 MG chewable tablet Chew 81 mg by mouth daily.   calcium-vitamin D 250-100 MG-UNIT per tablet Take 1 tablet by mouth 2 (two) times daily.   Fluticasone-Salmeterol (WIXELA INHUB) 250-50 MCG/DOSE AEPB Inhale 1 puff into the lungs 2 (two) times daily.   Multiple Vitamin (MULTIVITAMIN) capsule Take 1 capsule by mouth daily.   sodium chloride (OCEAN) 0.65 % SOLN nasal spray Place 1 spray into both nostrils as needed for congestion.   traMADol (ULTRAM)  50 MG tablet Take 1 tablet (50 mg total) by mouth every 6 (six) hours as needed.   No facility-administered encounter medications on file as of 11/13/2020.    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   Sarcoidosis    Seasonal allergies    Thyroid dysfunction     NODULE ABN CELLS 2006   Past Surgical History:  Procedure Laterality Date   ABDOMINAL HYSTERECTOMY  [place]   TIBIA FRACTURE SURGERY Left 2009   had 6 surgeries to repair and debride; and skin graft   TONSILLECTOMY AND [place]   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  2011   Family History  Problem Relation Age of Onset   Diabetes Mother    Cancer Mother        BREAST, THYROID   Dementia Mother    Heart disease Father        MI X30   Cancer Father        lung   COPD Father    Hypertension Sister  Hypertension Brother    Hypertension Son        mild    Colon cancer Neg Hx    Social History   Socioeconomic History   Marital status: Married    Spouse name: Mallie Mussel   Number of children: 3   Years of education: 16   Highest education level: Not on file  Occupational History   Occupation: retired  Tobacco Use   Smoking status: Never Smoker   Smokeless tobacco: Never Used  Scientific laboratory technician Use: Never used  Substance and Sexual Activity   Alcohol use: Yes    Alcohol/week: 0.0 - 1.0 standard drinks   Drug use: No   Sexual activity: Not Currently  Other Topics Concern   Not on file  Social History Narrative   O+ BLOOD DONATES Q 3 MONTHS.  EXERCISE-WALKS.   Mother in an assisted living facility in [place],  where the patient's sister lives.   The patient lives with her husband.  Her son lives in [place], [place].   One daughter lives in [place], the other in [place]. Patient does exercise.   Social Determinants of Health   Financial Resource Strain: Low Risk    Difficulty of Paying Living Expenses: Not hard at all  Food Insecurity: No Food Insecurity   Worried About Charity fundraiser in the Last Year: Never true   [place] in the Last Year: Never true  Transportation Needs: No Transportation Needs   Lack of Transportation (Medical): No   Lack of Transportation (Non-Medical): No  Physical Activity: Sufficiently Active   Days of Exercise per Week: 5 days   Minutes of Exercise per Session: 40 min  Stress: No Stress Concern Present   Feeling of Stress : Not at all  Social Connections: Moderately Integrated   Frequency of Communication with Friends and Family: More than three times a week   Frequency of Social Gatherings with Friends and Family: Once a week   Attends Religious Services: More than 4 times per year   Active Member of [employer] or Organizations: No   Attends Music therapist: Never   Marital Status: Married    Tobacco Counseling Counseling given: Not Answered   Clinical Intake:  Pre-visit preparation completed: Yes  Pain : 0-10 Pain Score: 3  Pain Type: Acute pain Pain Location: Ankle Pain Orientation: Right Pain Onset: 1 to 4 weeks ago Pain Frequency: Intermittent     Nutritional Status: BMI 25 -29 Overweight Nutritional Risks: None Diabetes: No  How often do you need to have someone help you when you read instructions, pamphlets, or other written materials from your doctor or pharmacy?: 1 - Never What is the last grade level you completed in school?: 4 yrs of college  Diabetic?No  Interpreter Needed?: No  Information entered by :: Caroleen Hamman LPN   Activities of Daily Living In your present state of  health, do you have any difficulty performing the following activities: 11/13/2020  Hearing? N  Vision? N  Difficulty concentrating or making decisions? N  Walking or climbing stairs? N  Dressing or bathing? N  Doing errands, shopping? N  Preparing Food and eating ? N  Using the Toilet? N  In the past six months, have you accidently leaked urine? N  Do you have problems with loss of bowel control? N  Managing your Medications? N  Managing your Finances? N  Housekeeping or managing your Housekeeping? N  Some recent data might be  hidden    Patient Care Team: Copland, Gay Filler, MD as PCP - General (Family Medicine) Amalia Greenhouse, MD (Endocrinology) Sharyne Peach, MD (Ophthalmology) Hurley Cisco, MD (Internal Medicine) Jovita Gamma, MD (Neurosurgery) Ladell Pier, MD (Internal Medicine)  Indicate any recent Medical Services you may have received from other than [facility] providers in the past year (date may be approximate).     Assessment:   This is a routine wellness examination for Matylda.  Hearing/Vision screen  Hearing Screening   125Hz  250Hz  500Hz  1000Hz  2000Hz  3000Hz  4000Hz  6000Hz  8000Hz   Right ear:           Left ear:           Comments: No issues  Vision Screening Comments: Wears glasses Last eye exam-11/2020-Dr.. Gould  Dietary issues and exercise activities discussed: Current Exercise Habits: Home exercise routine, Type of exercise: yoga;strength training/weights;treadmill (exercise bike), Time (Minutes): 45, Frequency (Times/Week): 5, Weekly Exercise (Minutes/Week): 225, Intensity: Mild, Exercise limited by: None identified  Goals     Patient Stated     Maintain healthy active lifestyle.      Depression Screen PHQ 2/9 Scores 11/13/2020 11/12/2019 11/07/2018 08/11/2017 03/17/2016 06/16/2015 03/17/2015  PHQ - 2 Score 0 1 0 0 0 0 0  Exception Documentation - - - Patient refusal - - -    Fall Risk Fall Risk  11/13/2020 11/21/2019 11/12/2019 11/14/2018  11/07/2018  Falls in the past year? 1 0 0 0 0  Comment - Emmi Telephone Survey: data to providers prior to load - Emmi Telephone Survey: data to providers prior to load -  Number falls in past yr: 0 - 0 - -  Injury with Fall? 1 - 0 - -  Risk for fall due to : History of fall(s) - - - -  Follow up Falls prevention discussed - - - -    Any stairs in or around the home? Yes  If so, are there any without handrails? No  Home free of loose throw rugs in walkways, pet beds, electrical cords, etc? Yes  Adequate lighting in your home to reduce risk of falls? Yes   ASSISTIVE DEVICES UTILIZED TO PREVENT FALLS:  Life alert? No  Use of a cane, walker or w/c? No  Grab bars in the bathroom? Yes  Shower chair or bench in shower? No  Elevated toilet seat or a handicapped toilet? No   TIMED UP AND GO:  Was the test performed? Yes .  Length of time to ambulate 10 feet: 9 sec.   Gait steady and fast without use of assistive device  Cognitive Function:No cognitive impairment noted        Immunizations Immunization History  Administered Date(s) Administered   Fluad Quad(high Dose 65+) 11/20/2019   Influenza Whole 11/06/2011   Influenza, High Dose Seasonal PF 12/13/2016, 12/14/2017, 10/25/2018   Influenza, Seasonal, Injecte, Preservative Fre 02/19/2013   Influenza,inj,Quad PF,6+ Mos 12/29/2013, 09/16/2014, 09/17/2015   Influenza-Unspecified 09/30/2020   PFIZER SARS-COV-2 Vaccination 02/21/2020, 03/12/2020, 09/30/2020   Pneumococcal Conjugate-13 09/16/2014   Pneumococcal Polysaccharide-23 09/26/2004, 03/17/2015   Tdap 01/13/2011   Zoster 03/17/2016    TDAP status: Up to date   Flu Vaccine status: Up to date   Pneumococcal vaccine status: Up to date   Covid-19 vaccine status: Completed vaccines  Qualifies for Shingles Vaccine? Yes   Zostavax completed Yes   Shingrix Completed?: No.    Education has been provided regarding the importance of this vaccine. Patient has been  advised to call  insurance company to determine out of pocket expense if they have not yet received this vaccine. Advised may also receive vaccine at local pharmacy or Health Dept. Verbalized acceptance and understanding.  Screening Tests Health Maintenance  Topic Date Due   URINE MICROALBUMIN  Never done   COLONOSCOPY  06/11/2022 (Originally 11/09/2019)   TETANUS/TDAP  01/13/2021   MAMMOGRAM  06/05/2022   INFLUENZA VACCINE  Completed   DEXA SCAN  Completed   COVID-19 Vaccine  Completed   Hepatitis C Screening  Completed   PNA vac Low Risk Adult  Completed    Health Maintenance  Health Maintenance Due  Topic Date Due   URINE MICROALBUMIN  Never done    Colorectal cancer screening: Completed Colonoscopy 11/08/2014. Repeat every 8 years   Mammogram status: Completed Bilateral 06/05/2020. Repeat every year   Bone Density status: Completed 05/30/2019. Results reflect: Bone density results: OSTEOPENIA. Repeat every 2 years.  Lung Cancer Screening: (Low Dose CT Chest recommended if Age 53-80 years, 30 pack-year currently smoking OR have quit w/in 15years.) does not qualify.     Additional Screening:  Hepatitis C Screening: Completed 09/17/2015  Vision Screening: Recommended annual ophthalmology exams for early detection of glaucoma and other disorders of the eye. Is the patient up to date with their annual eye exam?  Yes  Who is the provider or what is the name of the office in which the patient attends annual eye exams? Dr. Delman Cheadle  Dental Screening: Recommended annual dental exams for proper oral hygiene  Community Resource Referral / Chronic Care Management: CRR required this visit?  No   CCM required this visit?  No      Plan:     I have personally reviewed and noted the following in the patients chart:    Medical and social history  Use of alcohol, tobacco or illicit drugs   Current medications and supplements  Functional ability and  status  Nutritional status  Physical activity  Advanced directives  List of other physicians  Hospitalizations, surgeries, and ER visits in previous 12 months  Vitals  Screenings to include cognitive, depression, and falls  Referrals and appointments  In addition, I have reviewed and discussed with patient certain preventive protocols, quality metrics, and best practice recommendations. A written personalized care plan for preventive services as well as general preventive health recommendations were provided to patient.   Patient to access AVS via mychart  Marta Antu, LPN   [id number]  Nurse Health Advisor  Nurse Notes: None

## 2020-11-13 ENCOUNTER — Ambulatory Visit (INDEPENDENT_AMBULATORY_CARE_PROVIDER_SITE_OTHER): Payer: Medicare Other

## 2020-11-13 ENCOUNTER — Ambulatory Visit: Payer: Self-pay | Admitting: *Deleted

## 2020-11-13 ENCOUNTER — Other Ambulatory Visit: Payer: Self-pay

## 2020-11-13 VITALS — BP 136/70 | HR 79 | Temp 98.3°F | Resp 16 | Ht 61.0 in | Wt 147.8 lb

## 2020-11-13 DIAGNOSIS — Z0000 Encounter for general adult medical examination without abnormal findings: Secondary | ICD-10-CM | POA: Diagnosis not present

## 2020-11-13 NOTE — Patient Instructions (Signed)
[name] , Thank you for taking time to come for your Medicare Wellness Visit. I appreciate your ongoing commitment to your health goals. Please review the following plan we discussed and let me know if I can assist you in the future.   Screening recommendations/referrals: Colonoscopy: Completed 11/08/2014- Due-11/08/2022 Mammogram: Completed 06/05/2020- Due-06/05/2021 Bone Density: Completed 05/30/2019- Due 05/29/2021 Recommended yearly ophthalmology/optometry visit for glaucoma screening and checkup Recommended yearly dental visit for hygiene and checkup  Vaccinations: Influenza vaccine: Up to date Pneumococcal vaccine: Completed vaccines Tdap vaccine: Up to date- Due-01/13/2021 Shingles vaccine: Discuss with pharmacy Covid-19:Completed vaccines  Advanced directives: Copy in chart  Conditions/risks identified: See problem list  Next appointment: Follow up in one year for your annual wellness visit 11/26/2021 @ 9:40am   Preventive Care 65 Years and Older, Female Preventive care refers to lifestyle choices and visits with your health care provider that can promote health and wellness. What does preventive care include?  A yearly physical exam. This is also called an annual well check.  Dental exams once or twice a year.  Routine eye exams. Ask your health care provider how often you should have your eyes checked.  Personal lifestyle choices, including:  Daily care of your teeth and gums.  Regular physical activity.  Eating a healthy diet.  Avoiding tobacco and drug use.  Limiting alcohol use.  Practicing safe sex.  Taking low-dose aspirin every day.  Taking vitamin and mineral supplements as recommended by your health care provider. What happens during an annual well check? The services and screenings done by your health care provider during your annual well check will depend on your age, overall health, lifestyle risk factors, and family history of disease. Counseling   Your health care provider may ask you questions about your:  Alcohol use.  Tobacco use.  Drug use.  Emotional well-being.  Home and relationship well-being.  Sexual activity.  Eating habits.  History of falls.  Memory and ability to understand (cognition).  Work and work Statistician.  Reproductive health. Screening  You may have the following tests or measurements:  Height, weight, and BMI.  Blood pressure.  Lipid and cholesterol levels. These may be checked every 5 years, or more frequently if you are over 75 years old.  Skin check.  Lung cancer screening. You may have this screening every year starting at age 25 if you have a 30-pack-year history of smoking and currently smoke or have quit within the past 15 years.  Fecal occult blood test (FOBT) of the stool. You may have this test every year starting at age 1.  Flexible sigmoidoscopy or colonoscopy. You may have a sigmoidoscopy every 5 years or a colonoscopy every 10 years starting at age 52.  Hepatitis C blood test.  Hepatitis B blood test.  Sexually transmitted disease (STD) testing.  Diabetes screening. This is done by checking your blood sugar (glucose) after you have not eaten for a while (fasting). You may have this done every 1-3 years.  Bone density scan. This is done to screen for osteoporosis. You may have this done starting at age 50.  Mammogram. This may be done every 1-2 years. Talk to your health care provider about how often you should have regular mammograms. Talk with your health care provider about your test results, treatment options, and if necessary, the need for more tests. Vaccines  Your health care provider may recommend certain vaccines, such as:  Influenza vaccine. This is recommended every year.  Tetanus, diphtheria, and  acellular pertussis (Tdap, Td) vaccine. You may need a Td booster every 10 years.  Zoster vaccine. You may need this after age 73.  Pneumococcal 13-valent  conjugate (PCV13) vaccine. One dose is recommended after age 73.  Pneumococcal polysaccharide (PPSV23) vaccine. One dose is recommended after age 73. Talk to your health care provider about which screenings and vaccines you need and how often you need them. This information is not intended to replace advice given to you by your health care provider. Make sure you discuss any questions you have with your health care provider. Document Released: 01/09/2016 Document Revised: 09/01/2016 Document Reviewed: 10/14/2015 Elsevier Interactive Patient Education  2017 [place] Prevention in the Home Falls can cause injuries. They can happen to people of all ages. There are many things you can do to make your home safe and to help prevent falls. What can I do on the outside of my home?  Regularly fix the edges of walkways and driveways and fix any cracks.  Remove anything that might make you trip as you walk through a door, such as a raised step or threshold.  Trim any bushes or trees on the path to your home.  Use bright outdoor lighting.  Clear any walking paths of anything that might make someone trip, such as rocks or tools.  Regularly check to see if handrails are loose or broken. Make sure that both sides of any steps have handrails.  Any raised decks and porches should have guardrails on the edges.  Have any leaves, snow, or ice cleared regularly.  Use sand or salt on walking paths during winter.  Clean up any spills in your garage right away. This includes oil or grease spills. What can I do in the bathroom?  Use night lights.  Install grab bars by the toilet and in the tub and shower. Do not use towel bars as grab bars.  Use non-skid mats or decals in the tub or shower.  If you need to sit down in the shower, use a plastic, non-slip stool.  Keep the floor dry. Clean up any water that spills on the floor as soon as it happens.  Remove soap buildup in the tub or  shower regularly.  Attach bath mats securely with double-sided non-slip rug tape.  Do not have throw rugs and other things on the floor that can make you trip. What can I do in the bedroom?  Use night lights.  Make sure that you have a light by your bed that is easy to reach.  Do not use any sheets or blankets that are too big for your bed. They should not hang down onto the floor.  Have a firm chair that has side arms. You can use this for support while you get dressed.  Do not have throw rugs and other things on the floor that can make you trip. What can I do in the kitchen?  Clean up any spills right away.  Avoid walking on wet floors.  Keep items that you use a lot in easy-to-reach places.  If you need to reach something above you, use a strong step stool that has a grab bar.  Keep electrical cords out of the way.  Do not use floor polish or wax that makes floors slippery. If you must use wax, use non-skid floor wax.  Do not have throw rugs and other things on the floor that can make you trip. What can I do with my stairs?  Do not leave any items on the stairs.  Make sure that there are handrails on both sides of the stairs and use them. Fix handrails that are broken or loose. Make sure that handrails are as long as the stairways.  Check any carpeting to make sure that it is firmly attached to the stairs. Fix any carpet that is loose or worn.  Avoid having throw rugs at the top or bottom of the stairs. If you do have throw rugs, attach them to the floor with carpet tape.  Make sure that you have a light switch at the top of the stairs and the bottom of the stairs. If you do not have them, ask someone to add them for you. What else can I do to help prevent falls?  Wear shoes that:  Do not have high heels.  Have rubber bottoms.  Are comfortable and fit you well.  Are closed at the toe. Do not wear sandals.  If you use a stepladder:  Make sure that it is fully  opened. Do not climb a closed stepladder.  Make sure that both sides of the stepladder are locked into place.  Ask someone to hold it for you, if possible.  Clearly mark and make sure that you can see:  Any grab bars or handrails.  First and last steps.  Where the edge of each step is.  Use tools that help you move around (mobility aids) if they are needed. These include:  Canes.  Walkers.  Scooters.  Crutches.  Turn on the lights when you go into a dark area. Replace any light bulbs as soon as they burn out.  Set up your furniture so you have a clear path. Avoid moving your furniture around.  If any of your floors are uneven, fix them.  If there are any pets around you, be aware of where they are.  Review your medicines with your doctor. Some medicines can make you feel dizzy. This can increase your chance of falling. Ask your doctor what other things that you can do to help prevent falls. This information is not intended to replace advice given to you by your health care provider. Make sure you discuss any questions you have with your health care provider. Document Released: 10/09/2009 Document Revised: 05/20/2016 Document Reviewed: 01/17/2015 Elsevier Interactive Patient Education  2017 [employer].

## 2020-11-14 DIAGNOSIS — S93491A Sprain of other ligament of right ankle, initial encounter: Secondary | ICD-10-CM | POA: Diagnosis not present

## 2020-11-14 DIAGNOSIS — S93409A Sprain of unspecified ligament of unspecified ankle, initial encounter: Secondary | ICD-10-CM | POA: Insufficient documentation

## 2020-11-14 DIAGNOSIS — M25571 Pain in right ankle and joints of right foot: Secondary | ICD-10-CM | POA: Diagnosis not present

## 2020-11-18 DIAGNOSIS — M25571 Pain in right ankle and joints of right foot: Secondary | ICD-10-CM | POA: Diagnosis not present

## 2020-11-23 IMAGING — CR DG CHEST 2V
2 series · 2 of 2 positions shown · non-contrast
Comparison: None.
COMPARISON: None.

Addendum:
CLINICAL DATA: History of pulmonary sarcoidosis.

EXAM:
CHEST - 2 VIEW

[w chest pa]
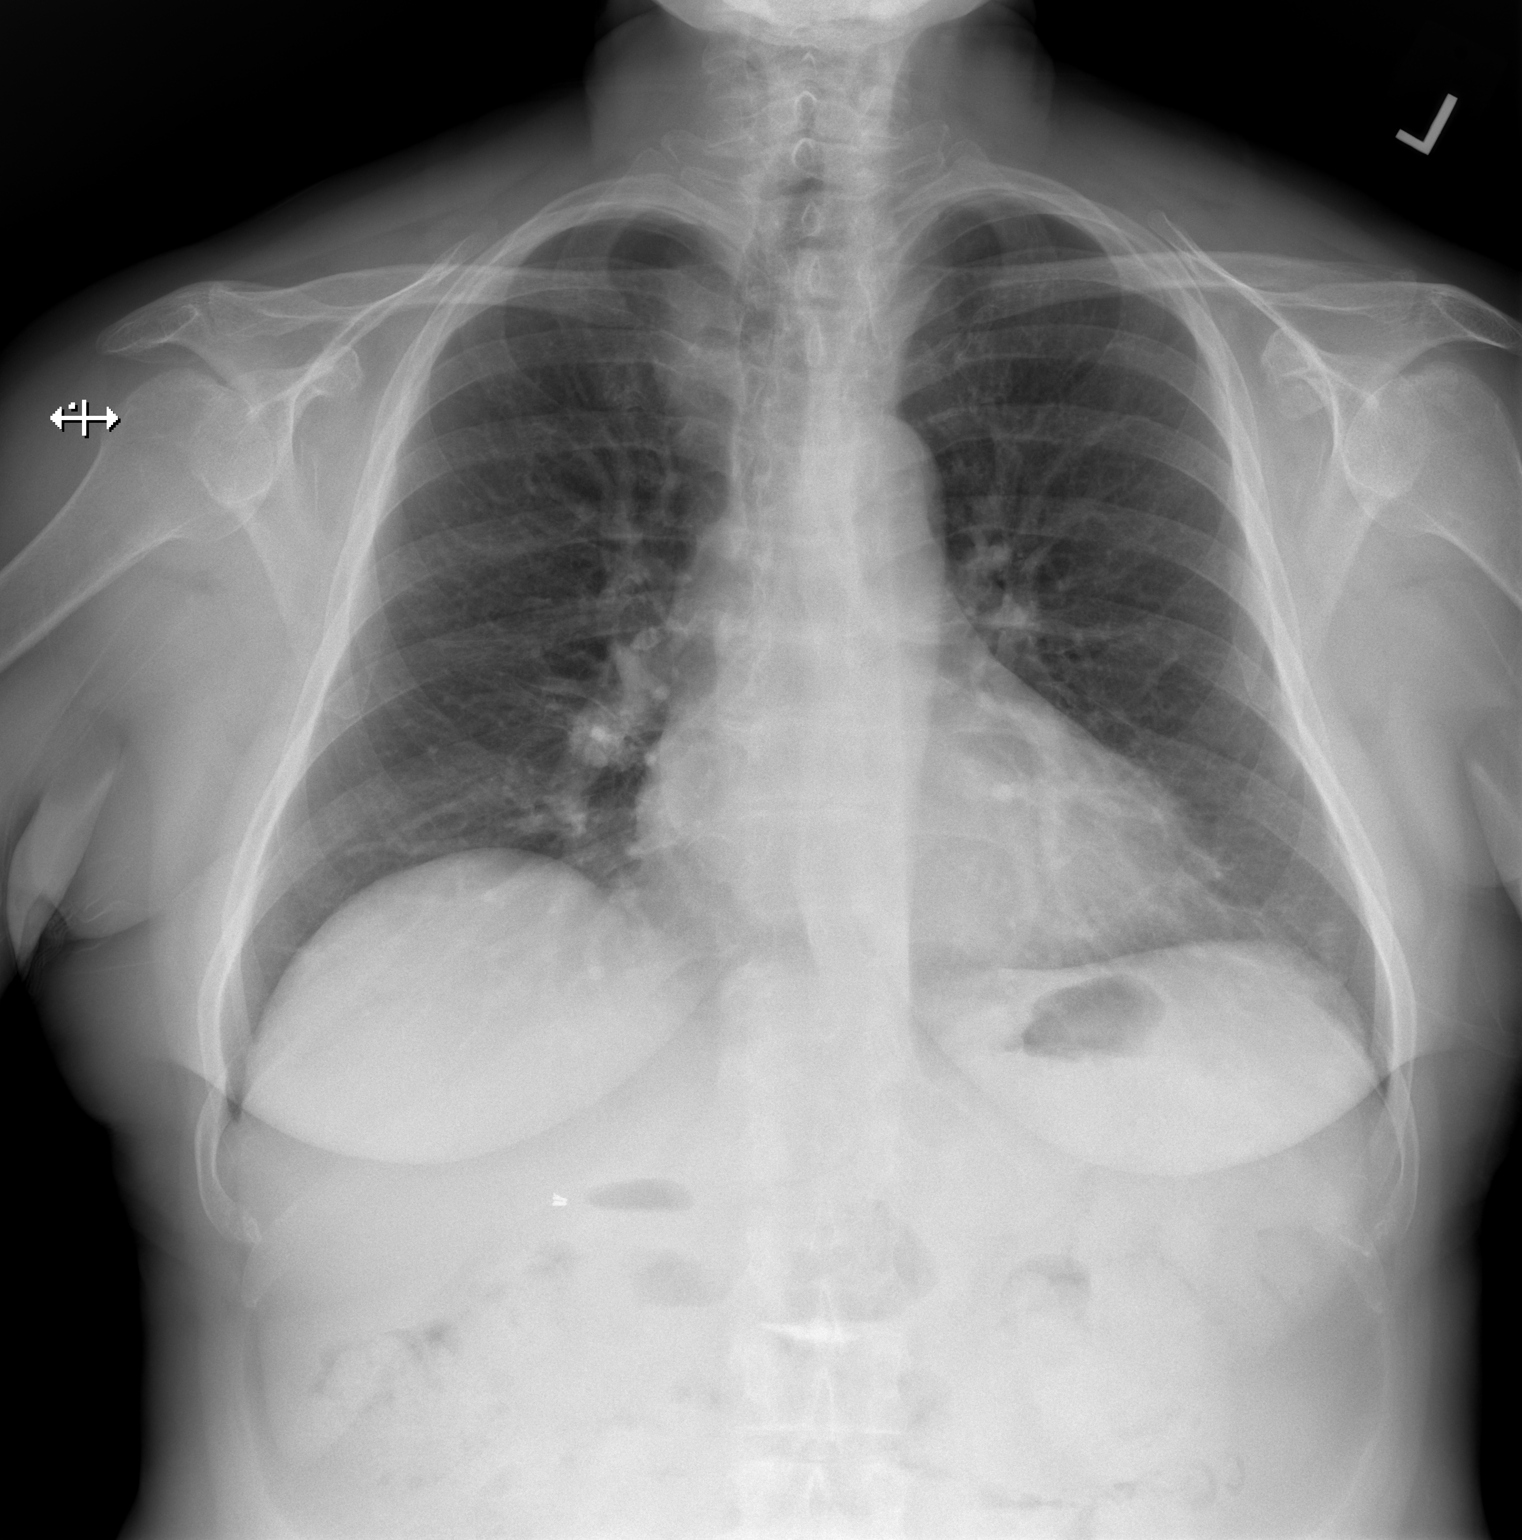

[w chest lat]
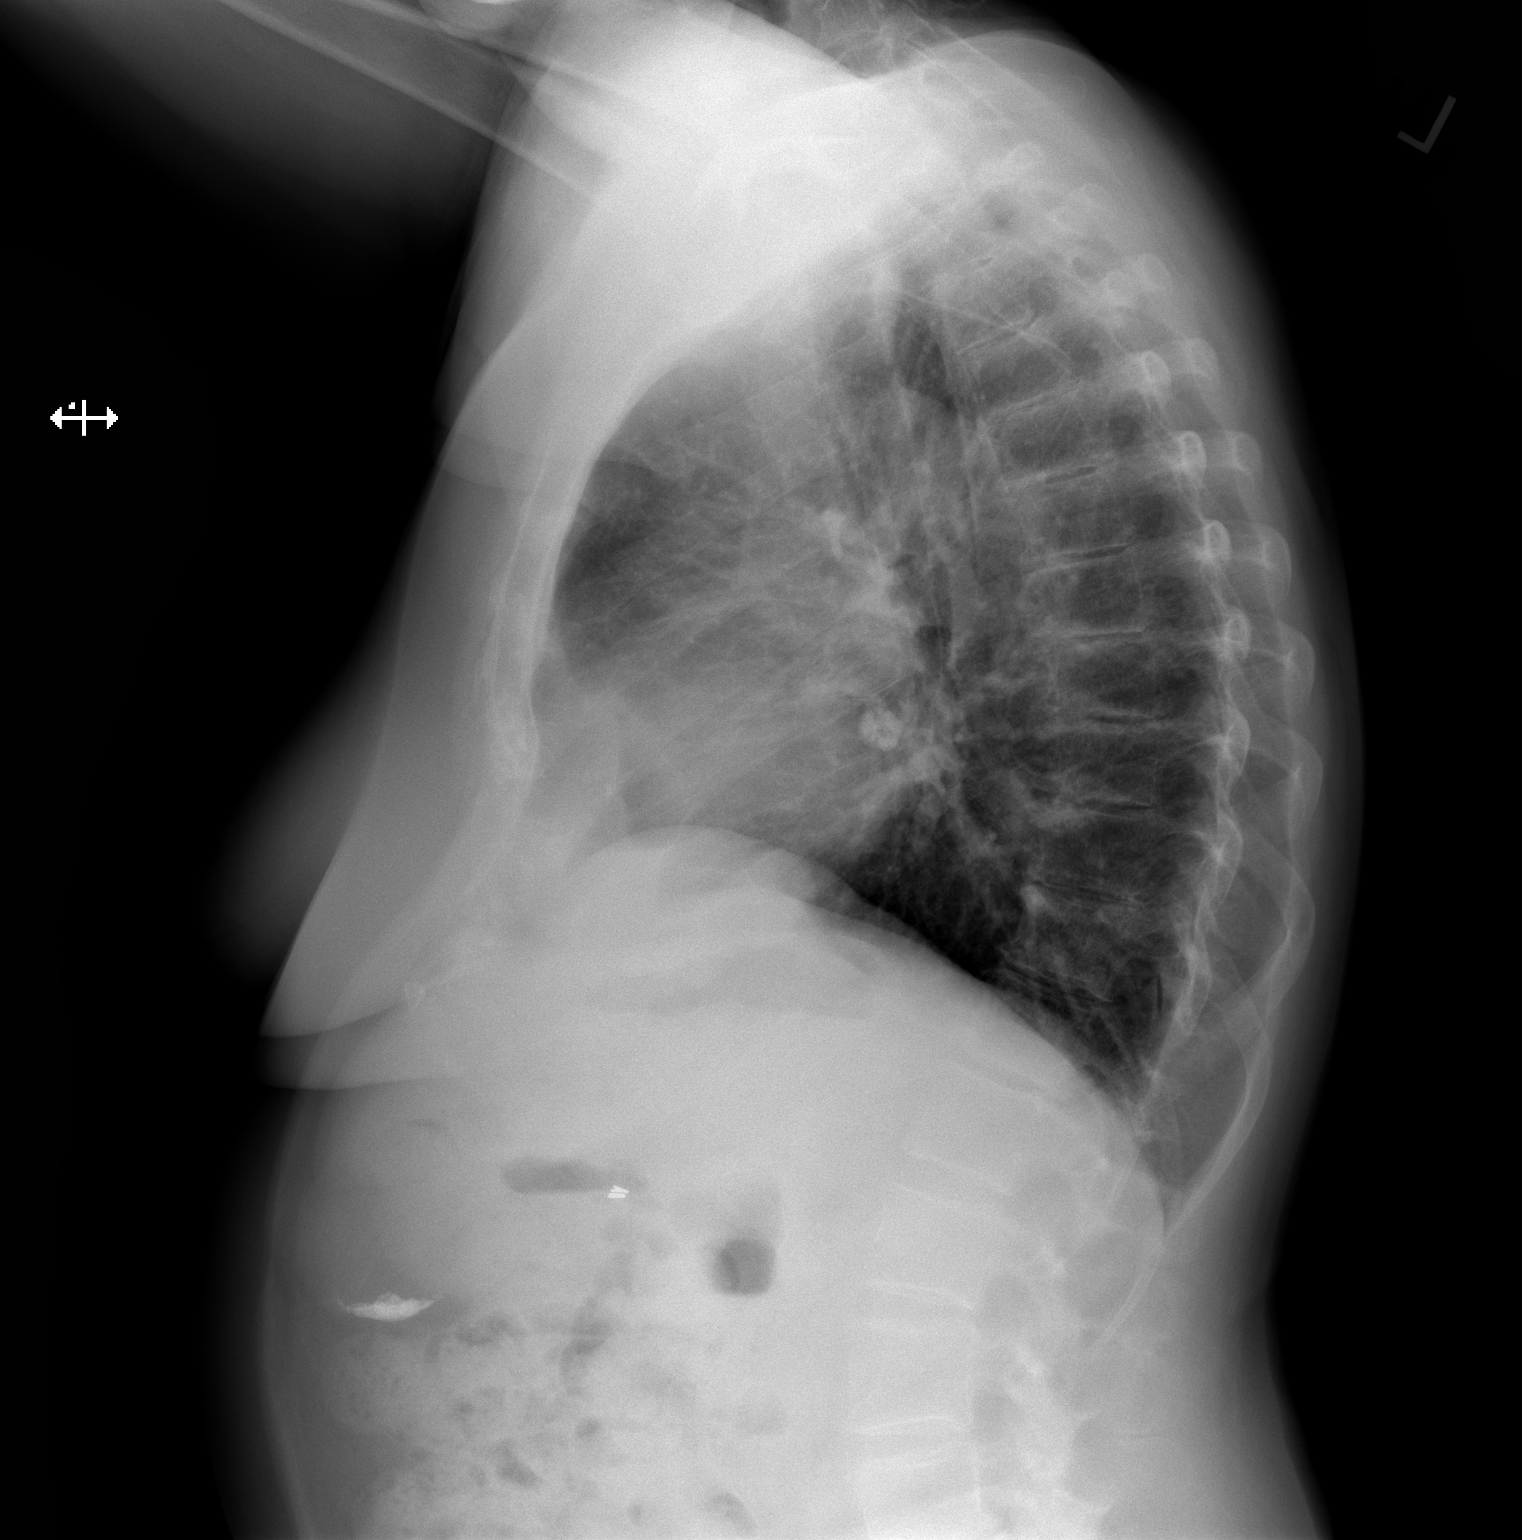

[2 of 2 positions shown; findings below may reference images not displayed]

FINDINGS: Heart size is normal. Paratracheal and perihilar soft tissue is
consistent history of sarcoidosis. Bibasilar airspace opacities are
stable. New airspace opacity or mass lesion is present. Calcium
fluid layer is noted in antrum the stomach. Surgical clips are
present the gallbladder fossa.
IMPRESSION: 1. Stable chronic changes in the lungs consistent with sarcoidosis.
2. No acute cardiopulmonary disease.

ADDENDUM:
Voice recognition error: The fourth sentence of the Findings section
should read "No new airspace opacity or mass lesion is present. "

*** End of Addendum ***
FINDINGS: Heart size is normal. Paratracheal and perihilar soft tissue is
consistent history of sarcoidosis. Bibasilar airspace opacities are
stable. New airspace opacity or mass lesion is present. Calcium
fluid layer is noted in antrum the stomach. Surgical clips are
present the gallbladder fossa.
IMPRESSION: 1. Stable chronic changes in the lungs consistent with sarcoidosis.
2. No acute cardiopulmonary disease.

## 2020-12-05 ENCOUNTER — Other Ambulatory Visit (HOSPITAL_COMMUNITY): Payer: PRIVATE HEALTH INSURANCE

## 2020-12-12 NOTE — Progress Notes (Addendum)
[facility] [address], [place], [place] [address] [phone]  Date:  12/17/2020   Name:  Kristina Mann   DOB:  [birth year]   MRN:  [id number]  PCP:  Darreld Mclean, MD    Chief Complaint: Lab Work (Low iron level)   History of Present Illness:  Kristina Mann is a 74 y.o. very pleasant female patient who presents with the following:  Here today for a follow-up visit- last seen by myself in October - at that time she noted a lack of energy, looking back she lost a very good friend around that time and thinks this may have been a grief reaction.  She is no longer noticing these sx, but does plan to have her stress test done next month anyway She also has had iron deficiency without anemia recently  History of prediabetes, hypertension, sarcoidosis, osteopenia, subclinical hyperthyroidism, chronic left ankle swelling due to fracture 2009.Her ankle problem does make it harder for her to be as active as she would like Seen in the ER on 11/4 after a fall - she hurt her good/ right ankle but it is doing much better now and swelling is down   She has been released from her endocrinologist Dr Tamala Julian -I can check on her thyroid for periodically  Her daughter is a Optometrist of some sort, she tends to have a list of labs that she wants me to perform for the patient.  I discussed this with Kristina Mann today.  I am certainly glad to help within reason, however I do not feel comfortable when I am asked to order labs that I do not feel are necessary or when I do not have a plan for the results.  Patient states understanding and is willing to have me order the labs that I feel comfortable ordering  COVID done including booster Flu done  Colon pushed back to 2023 due to change in follow-up recommendations Too early for shingirx   Lab Results  Component Value Date   TSH 0.78 12/17/2020   Lab Results  Component Value Date    HGBA1C 5.7 12/17/2020     Patient Active Problem List   Diagnosis Date Noted  . Osteopenia 03/30/2017  . Trigger finger, acquired 01/18/2017  . Multinodular goiter 05/12/2016  . Subclinical hyperthyroidism 05/12/2016  . Pre-diabetes 06/16/2015  . Low back pain 11/20/2013  . Seasonal allergies 01/20/2012  . Thyroid nodule 01/20/2012  . Sarcoidosis 01/20/2012  . HTN (hypertension) 01/20/2012  . Basal cell cancer 01/20/2012    Past Medical History:  Diagnosis Date  . Basal cell cancer 06/2005   Patient denies any cancer  . Bronchitis   . Chicken pox   . GERD (gastroesophageal reflux disease)   . Hypertension   . 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 Sarcoidosis   . Seasonal allergies   . Thyroid dysfunction     NODULE ABN CELLS 2006    Past Surgical History:  Procedure Laterality Date  . ABDOMINAL HYSTERECTOMY  1992  . APPENDECTOMY  1978  . CARPAL TUNNEL RELEASE Left 1979  . CARPAL TUNNEL RELEASE Right 1980  . [address]  . TIBIA FRACTURE SURGERY Left 2009   had 6 surgeries to repair and debride; and skin graft  . TONSILLECTOMY AND ADENOIDECTOMY  1953  . TUBAL LIGATION  1983  . 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  Smoking status: Never Smoker  . Smokeless tobacco: Never Used  Vaping Use  . Vaping Use: Never used  Substance Use Topics  . Alcohol use: Yes    Alcohol/week: 0.0 - 1.0 standard drinks  . Drug use: No    Family History  Problem Relation Age of Onset  . Diabetes Mother   . Cancer Mother        BREAST, THYROID  . Dementia Mother   . Heart disease Father        MI X3  . Cancer Father        lung  . COPD Father   . Hypertension Sister   . Hypertension Brother   . Hypertension Son        mild   . Colon cancer Neg Hx     Allergies  Allergen Reactions  . Codeine Nausea And Vomiting    Medication list has been reviewed and updated.  Current Outpatient Medications on File Prior to Visit   Medication Sig Dispense Refill  . aspirin 81 MG chewable tablet Chew 81 mg by mouth daily.    . calcium-vitamin D 250-100 MG-UNIT per tablet Take 1 tablet by mouth 2 (two) times daily.    . Fluticasone-Salmeterol (WIXELA INHUB) 250-50 MCG/DOSE AEPB Inhale 1 puff into the lungs 2 (two) times daily. 60 each 11  . Multiple Vitamin (MULTIVITAMIN) capsule Take 1 capsule by mouth daily.    . sodium chloride (OCEAN) 0.65 % SOLN nasal spray Place 1 spray into both nostrils as needed for congestion. 15 mL 0   No current facility-administered medications on file prior to visit.    Review of Systems:  As per HPI- otherwise negative.   Physical Examination: Vitals:   12/17/20 0842 12/17/20 0859  BP: 140/80 132/70  Pulse: 69   Resp: 17   SpO2: 99%    Vitals:   12/17/20 0842  Weight: 153 lb (69.4 kg)  Height: 5\' 1"  (1.549 m)   Body mass index is 28.91 kg/m. Ideal Body Weight: Weight in (lb) to have BMI = 25: 132  GEN: no acute distress.  Minimal overweight, looks well HEENT: Atraumatic, Normocephalic.  Ears and Nose: No external deformity. CV: RRR, No M/G/R. No JVD. No thrill. No extra heart sounds. PULM: CTA B, no wheezes, crackles, rhonchi. No retractions. No resp. distress. No accessory muscle use. ABD: S, NT, ND, +BS. No rebound. No HSM. EXTR: No c/c/e PSYCH: Normally interactive. Conversant.  Chronic left ankle stiffness and swelling from operative repair   Assessment and Plan: Subclinical hyperthyroidism - Plan: TSH, Lipid panel, T3, free  Sarcoidosis - Plan: Comprehensive metabolic panel  Pre-diabetes - Plan: Hemoglobin A1c  Iron deficiency - Plan: CBC with Differential/Platelet, Iron, TIBC and Ferritin Panel  Vitamin D deficiency - Plan: VITAMIN D 25 Hydroxy (Vit-D Deficiency, Fractures)   Labs are pending as above, I will be in touch with patient pending these results She has history of sarcoidosis with mostly just pulmonary symptoms.  She has been stable on her  current combined inhaler.  I encouraged her to continue with medication for the time being.  I am certainly glad to have her see pulmonology for evaluation at any time she might like  This visit occurred during the SARS-CoV-2 public health emergency.  Safety protocols were in place, including screening questions prior to the visit, additional usage of staff PPE, and extensive cleaning of exam room while observing appropriate contact time as indicated for disinfecting solutions.    Signed Janett Billow  Dean Wonder, MD  Received labs as below, message to pt Addendum 12/23, received patient's iron studies.  Repeat message to patient We have been following some concerns about her iron and ferritin, I have referred her to hematology in October but she did not end up scheduling  Results for orders placed or performed in visit on 12/17/20  TSH  Result Value Ref Range   TSH 0.78 0.35 - 4.50 uIU/mL  Hemoglobin A1c  Result Value Ref Range   Hgb A1c MFr Bld 5.7 4.6 - 6.5 %  CBC with Differential/Platelet  Result Value Ref Range   WBC 5.9 4.0 - 10.5 K/uL   RBC 5.05 3.87 - 5.11 Mil/uL   Hemoglobin 13.5 12.0 - 15.0 g/dL   HCT 42.1 36.0 - 46.0 %   MCV 83.4 78.0 - 100.0 fl   MCHC 32.2 30.0 - 36.0 g/dL   RDW 19.8 (H) 11.5 - 15.5 %   Platelets 290.0 150.0 - 400.0 K/uL   Neutrophils Relative % 56.8 43.0 - 77.0 %   Lymphocytes Relative 32.0 12.0 - 46.0 %   Monocytes Relative 7.9 3.0 - 12.0 %   Eosinophils Relative 2.6 0.0 - 5.0 %   Basophils Relative 0.7 0.0 - 3.0 %   Neutro Abs 3.4 1.4 - 7.7 K/uL   Lymphs Abs 1.9 0.7 - 4.0 K/uL   Monocytes Absolute 0.5 0.1 - 1.0 K/uL   Eosinophils Absolute 0.2 0.0 - 0.7 K/uL   Basophils Absolute 0.0 0.0 - 0.1 K/uL  Comprehensive metabolic panel  Result Value Ref Range   Sodium 142 135 - 145 mEq/L   Potassium 4.3 3.5 - 5.1 mEq/L   Chloride 107 96 - 112 mEq/L   CO2 29 19 - 32 mEq/L   Glucose, Bld 95 70 - 99 mg/dL   BUN 16 6 - 23 mg/dL   Creatinine, Ser 0.74 0.40 -  1.20 mg/dL   Total Bilirubin 0.3 0.2 - 1.2 mg/dL   Alkaline Phosphatase 90 39 - 117 U/L   AST 18 0 - 37 U/L   ALT 17 0 - 35 U/L   Total Protein 6.3 6.0 - 8.3 g/dL   Albumin 4.1 3.5 - 5.2 g/dL   GFR 80.47 >60.00 mL/min   Calcium 9.2 8.4 - 10.5 mg/dL  VITAMIN D 25 Hydroxy (Vit-D Deficiency, Fractures)  Result Value Ref Range   VITD 55.52 30.00 - 100.00 ng/mL  Lipid panel  Result Value Ref Range   Cholesterol 214 (H) 0 - 200 mg/dL   Triglycerides 67.0 0.0 - 149.0 mg/dL   HDL 85.10 >39.00 mg/dL   VLDL 13.4 0.0 - 40.0 mg/dL   LDL Cholesterol 115 (H) 0 - 99 mg/dL   Total CHOL/HDL Ratio 3    NonHDL 128.63   T3, free  Result Value Ref Range   T3, Free 3.2 2.3 - 4.2 pg/mL  Iron, TIBC and Ferritin Panel  Result Value Ref Range   Iron 257 (H) 45 - 160 mcg/dL   TIBC 418 250 - 450 mcg/dL (calc)   %SAT 61 (H) 16 - 45 % (calc)   Ferritin 12 (L) 16 - 288 ng/mL

## 2020-12-17 ENCOUNTER — Ambulatory Visit (INDEPENDENT_AMBULATORY_CARE_PROVIDER_SITE_OTHER): Payer: Medicare Other | Admitting: Family Medicine

## 2020-12-17 ENCOUNTER — Encounter: Payer: Self-pay | Admitting: Family Medicine

## 2020-12-17 ENCOUNTER — Other Ambulatory Visit: Payer: Self-pay

## 2020-12-17 VITALS — BP 132/70 | HR 69 | Resp 17 | Ht 61.0 in | Wt 153.0 lb

## 2020-12-17 DIAGNOSIS — R7303 Prediabetes: Secondary | ICD-10-CM | POA: Diagnosis not present

## 2020-12-17 DIAGNOSIS — D869 Sarcoidosis, unspecified: Secondary | ICD-10-CM

## 2020-12-17 DIAGNOSIS — E0590 Thyrotoxicosis, unspecified without thyrotoxic crisis or storm: Secondary | ICD-10-CM

## 2020-12-17 DIAGNOSIS — E611 Iron deficiency: Secondary | ICD-10-CM

## 2020-12-17 DIAGNOSIS — E559 Vitamin D deficiency, unspecified: Secondary | ICD-10-CM

## 2020-12-17 LAB — CBC WITH DIFFERENTIAL/PLATELET
Basophils Absolute: 0 10*3/uL (ref 0.0–0.1)
Basophils Relative: 0.7 % (ref 0.0–3.0)
Eosinophils Absolute: 0.2 10*3/uL (ref 0.0–0.7)
Eosinophils Relative: 2.6 % (ref 0.0–5.0)
HCT: 42.1 % (ref 36.0–46.0)
Hemoglobin: 13.5 g/dL (ref 12.0–15.0)
Lymphocytes Relative: 32 % (ref 12.0–46.0)
Lymphs Abs: 1.9 10*3/uL (ref 0.7–4.0)
MCHC: 32.2 g/dL (ref 30.0–36.0)
MCV: 83.4 fl (ref 78.0–100.0)
Monocytes Absolute: 0.5 10*3/uL (ref 0.1–1.0)
Monocytes Relative: 7.9 % (ref 3.0–12.0)
Neutro Abs: 3.4 10*3/uL (ref 1.4–7.7)
Neutrophils Relative %: 56.8 % (ref 43.0–77.0)
Platelets: 290 10*3/uL (ref 150.0–400.0)
RBC: 5.05 Mil/uL (ref 3.87–5.11)
RDW: 19.8 % — ABNORMAL HIGH (ref 11.5–15.5)
WBC: 5.9 10*3/uL (ref 4.0–10.5)

## 2020-12-17 LAB — T3, FREE: T3, Free: 3.2 pg/mL (ref 2.3–4.2)

## 2020-12-17 LAB — COMPREHENSIVE METABOLIC PANEL
ALT: 17 U/L (ref 0–35)
AST: 18 U/L (ref 0–37)
Albumin: 4.1 g/dL (ref 3.5–5.2)
Alkaline Phosphatase: 90 U/L (ref 39–117)
BUN: 16 mg/dL (ref 6–23)
CO2: 29 mEq/L (ref 19–32)
Calcium: 9.2 mg/dL (ref 8.4–10.5)
Chloride: 107 mEq/L (ref 96–112)
Creatinine, Ser: 0.74 mg/dL (ref 0.40–1.20)
GFR: 80.47 mL/min (ref >60.00)
Glucose, Bld: 95 mg/dL (ref 70–99)
Potassium: 4.3 mEq/L (ref 3.5–5.1)
Sodium: 142 mEq/L (ref 135–145)
Total Bilirubin: 0.3 mg/dL (ref 0.2–1.2)
Total Protein: 6.3 g/dL (ref 6.0–8.3)

## 2020-12-17 LAB — LIPID PANEL
Cholesterol: 214 mg/dL — ABNORMAL HIGH (ref 0–200)
HDL: 85.1 mg/dL (ref >39.00)
LDL Cholesterol: 115 mg/dL — ABNORMAL HIGH (ref 0–99)
NonHDL: 128.63
Total CHOL/HDL Ratio: 3
Triglycerides: 67 mg/dL (ref 0.0–149.0)
VLDL: 13.4 mg/dL (ref 0.0–40.0)

## 2020-12-17 LAB — TSH: TSH: 0.78 u[IU]/mL (ref 0.35–4.50)

## 2020-12-17 LAB — HEMOGLOBIN A1C: Hgb A1c MFr Bld: 5.7 % (ref 4.6–6.5)

## 2020-12-17 LAB — VITAMIN D 25 HYDROXY (VIT D DEFICIENCY, FRACTURES): VITD: 55.52 ng/mL (ref 30.00–100.00)

## 2020-12-17 NOTE — Patient Instructions (Addendum)
It was good to see you today- I will be in touch with your labs asap.  Have a wonderful holiday season!   I am glad to try and order labs for you as your daughter requests.  However, unless I am simply drawing labs to pass off to another physician (as a favor say to a psychiatrist or derm who does not have a lab)  I am responsible for interpreting and managing all results- I therefore do not like to order labs unless I have a plan for what to do with the results and a clear understanding of why I am ordering the test.  Thank you for understanding!    If you like, we are glad to have you see pulmonology regarding your sarcoidosis treatment.  For now, I would recommend continuing your inhaled steroid as your pulmonary sarcoid symptoms are under good control

## 2020-12-18 ENCOUNTER — Encounter: Payer: Self-pay | Admitting: Family Medicine

## 2020-12-18 LAB — IRON,TIBC AND FERRITIN PANEL
%SAT: 61 % (calc) — ABNORMAL HIGH (ref 16–45)
Ferritin: 12 ng/mL — ABNORMAL LOW (ref 16–288)
Iron: 257 ug/dL — ABNORMAL HIGH (ref 45–160)
TIBC: 418 mcg/dL (calc) (ref 250–450)

## 2020-12-20 ENCOUNTER — Encounter: Payer: Self-pay | Admitting: Family Medicine

## 2020-12-23 ENCOUNTER — Encounter: Payer: Self-pay | Admitting: Family Medicine

## 2020-12-23 DIAGNOSIS — H52203 Unspecified astigmatism, bilateral: Secondary | ICD-10-CM | POA: Diagnosis not present

## 2020-12-23 DIAGNOSIS — E119 Type 2 diabetes mellitus without complications: Secondary | ICD-10-CM | POA: Diagnosis not present

## 2020-12-23 DIAGNOSIS — H5203 Hypermetropia, bilateral: Secondary | ICD-10-CM | POA: Diagnosis not present

## 2020-12-23 LAB — HM DIABETES EYE EXAM

## 2020-12-24 ENCOUNTER — Encounter: Payer: Self-pay | Admitting: Family Medicine

## 2021-01-16 ENCOUNTER — Other Ambulatory Visit (HOSPITAL_COMMUNITY)
Admission: RE | Admit: 2021-01-16 | Discharge: 2021-01-16 | Disposition: A | Discharge status: Home / Self Care | Payer: Medicare Other | Source: Ambulatory Visit | Attending: Family Medicine | Admitting: Family Medicine

## 2021-01-16 DIAGNOSIS — Z01812 Encounter for preprocedural laboratory examination: Secondary | ICD-10-CM | POA: Insufficient documentation

## 2021-01-16 DIAGNOSIS — Z20822 Contact with and (suspected) exposure to covid-19: Secondary | ICD-10-CM | POA: Insufficient documentation

## 2021-01-16 LAB — SARS CORONAVIRUS 2 (TAT 6-24 HRS): SARS Coronavirus 2: NEGATIVE

## 2021-01-17 ENCOUNTER — Encounter: Payer: Self-pay | Admitting: Family Medicine

## 2021-01-20 ENCOUNTER — Other Ambulatory Visit: Payer: Self-pay | Admitting: Cardiology

## 2021-01-20 ENCOUNTER — Ambulatory Visit (INDEPENDENT_AMBULATORY_CARE_PROVIDER_SITE_OTHER): Payer: Medicare Other

## 2021-01-20 ENCOUNTER — Other Ambulatory Visit: Payer: Self-pay | Admitting: Family Medicine

## 2021-01-20 ENCOUNTER — Other Ambulatory Visit: Payer: Self-pay

## 2021-01-20 DIAGNOSIS — I10 Essential (primary) hypertension: Secondary | ICD-10-CM

## 2021-01-20 DIAGNOSIS — R7303 Prediabetes: Secondary | ICD-10-CM | POA: Diagnosis not present

## 2021-01-20 DIAGNOSIS — D869 Sarcoidosis, unspecified: Secondary | ICD-10-CM

## 2021-01-20 DIAGNOSIS — R6889 Other general symptoms and signs: Secondary | ICD-10-CM

## 2021-01-20 DIAGNOSIS — R5383 Other fatigue: Secondary | ICD-10-CM

## 2021-01-20 LAB — EXERCISE TOLERANCE TEST
Estimated workload: 10.1 METS
Exercise duration (min): 7 min
Exercise duration (sec): 31 s
MPHR: 147 {beats}/min
Peak HR: 137 {beats}/min
Percent HR: 93 %
RPE: 16
Rest HR: 68 {beats}/min

## 2021-01-20 NOTE — Progress Notes (Unsigned)
Asked to discuss risk vs benefits of exercise stress test with the patient prior to proceeding with her study.   Shared Decision Making/Informed Consent The risks [chest pain, shortness of breath, cardiac arrhythmias, dizziness, blood pressure fluctuations, myocardial infarction, stroke/transient ischemic attack, and life-threatening complications (estimated to be 1 in 10,000)], benefits (risk stratification, diagnosing coronary artery disease, treatment guidance) and alternatives of an exercise tolerance test were discussed in detail with Kristina Mann and she agrees to proceed.   Signed, Freada Bergeron, MD  01/20/2021 9:34 AM    [place]

## 2021-01-22 ENCOUNTER — Encounter: Payer: Self-pay | Admitting: Family Medicine

## 2021-01-23 DIAGNOSIS — K1370 Unspecified lesions of oral mucosa: Secondary | ICD-10-CM | POA: Diagnosis not present

## 2021-01-31 ENCOUNTER — Other Ambulatory Visit: Payer: Self-pay | Admitting: Family Medicine

## 2021-01-31 DIAGNOSIS — D869 Sarcoidosis, unspecified: Secondary | ICD-10-CM

## 2021-03-24 DIAGNOSIS — M19172 Post-traumatic osteoarthritis, left ankle and foot: Secondary | ICD-10-CM | POA: Diagnosis not present

## 2021-03-24 DIAGNOSIS — M19072 Primary osteoarthritis, left ankle and foot: Secondary | ICD-10-CM | POA: Diagnosis not present

## 2021-03-24 DIAGNOSIS — M1712 Unilateral primary osteoarthritis, left knee: Secondary | ICD-10-CM | POA: Diagnosis not present

## 2021-06-11 DIAGNOSIS — M8589 Other specified disorders of bone density and structure, multiple sites: Secondary | ICD-10-CM | POA: Diagnosis not present

## 2021-06-11 DIAGNOSIS — Z780 Asymptomatic menopausal state: Secondary | ICD-10-CM | POA: Diagnosis not present

## 2021-06-11 DIAGNOSIS — Z1231 Encounter for screening mammogram for malignant neoplasm of breast: Secondary | ICD-10-CM | POA: Diagnosis not present

## 2021-06-11 LAB — HM DEXA SCAN: HM Dexa Scan: -1.9

## 2021-06-11 LAB — HM MAMMOGRAPHY

## 2021-06-12 ENCOUNTER — Encounter: Payer: Self-pay | Admitting: Family Medicine

## 2021-06-13 NOTE — Patient Instructions (Addendum)
Good to see you today- I will be in touch with your labs Please see me in about 6 months assuming all is well  Please let me know if these low grade fevers persist past the next 1-2 weeks- sooner if getting worse   You can do the shingles vaccine- Shingrix- at your convenience  Colon due next year  Best of luck with your ankle replacement !!

## 2021-06-13 NOTE — Progress Notes (Addendum)
[facility] [place] [place] [facility] [address], [place], [place] [address] [phone]  Date:  06/17/2021   Name:  Kristina Mann   DOB:  17-Dec-1946   MRN:  [id number]  PCP:  Darreld Mclean, MD    Chief Complaint: Hypertension (Follow up/)   History of Present Illness:  Kristina Mann is a 75 y.o. very pleasant female patient who presents with the following:  Here today for a periodic follow-up; History of prediabetes, hypertension, sarcoidosis, osteopenia, subclinical hyperthyroidism, chronic left ankle swelling due to fracture 2009.  Her ankle problem does make it harder for her to be as active as she would like.  Today, Kristina Mann states her ankle restricts her activities by 40 to 50%.  However, she has exciting news-she is planned to have an ankle replacement per an orthopedic surgeon with [facility] later on this summer  She got covid at the end of last month while in [place] visiting her family.   She was traveling for a graduation and had to stay for extra days to recover Her husband Kristina Mann got COVID as well They did not use any treatment for their covid illness- used a home pulse ox but did not have any desats  Her main concern now is that she is still running low grade temps in the afternoon- up to about 99.8  Her energy level is back to about 95%   She was seen by her optometrist- Dr Delman Cheadle - in December  Dr. Estanislado Pandy has managed her sarcoidosis in the past-she has generally asymptomatic is exposed to allergens or pollen.  Patient has not noticed any change or worsening of her symptoms.  She is no longer seeing rheumatology No SOB - she continues to check her pulse- ox daily in the am, never lower than 95%   She is back to doing her walking/ exercise without any abnormal SOB  Colon due next year- reminded pt  Covid 4th dose - not done yet, they plan to wait a few months now since they just had covid  Tetanus booster- this is due, and she had a  wound on her left index finger  Shingrix  Lab Results  Component Value Date   HGBA1C 5.7 12/17/2020   Vit D checked in December, normal  She is no longer seeing endocrinology- we check her thyroid for her   Lab Results  Component Value Date   TSH 0.78 12/17/2020      Patient Active Problem List   Diagnosis Date Noted   Osteopenia 03/30/2017   Trigger finger, acquired 01/18/2017   Multinodular goiter 05/12/2016   Subclinical hyperthyroidism 05/12/2016   Pre-diabetes 06/16/2015   Low back pain 11/20/2013   Seasonal allergies 01/20/2012   Thyroid nodule 01/20/2012   Sarcoidosis 01/20/2012   HTN (hypertension) 01/20/2012   Basal cell cancer 01/20/2012    Past Medical History:  Diagnosis Date   Basal cell cancer 06/2005   Patient denies any cancer   Bronchitis    Chicken pox    GERD (gastroesophageal reflux disease)    Hypertension    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   Sarcoidosis    Seasonal allergies    Thyroid dysfunction     NODULE ABN CELLS 2006    Past Surgical History:  Procedure Laterality Date   ABDOMINAL HYSTERECTOMY  1992   APPENDECTOMY  1978   CARPAL TUNNEL RELEASE Left 1979   CARPAL TUNNEL RELEASE Right [place]  1978   TIBIA FRACTURE SURGERY Left 2009   had 6 surgeries to repair and debride; and skin graft   [place]   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  2011    Social History   Tobacco Use   Smoking status: Never   Smokeless tobacco: Never  Vaping Use   Vaping Use: Never used  Substance Use Topics   Alcohol use: Yes    Alcohol/week: 0.0 - 1.0 standard drinks   Drug use: No    Family History  Problem Relation Age of Onset   Diabetes Mother    Cancer Mother        BREAST, THYROID   Dementia Mother    Heart disease Father        MI X3   Cancer Father        lung   COPD Father    Hypertension Sister    Hypertension Brother    Hypertension Son        mild    Colon  cancer Neg Hx     Allergies  Allergen Reactions   Codeine Nausea And Vomiting    Medication list has been reviewed and updated.  Current Outpatient Medications on File Prior to Visit  Medication Sig Dispense Refill   aspirin 81 MG chewable tablet Chew 81 mg by mouth daily.     calcium-vitamin D 250-100 MG-UNIT per tablet Take 1 tablet by mouth 2 (two) times daily.     Fluticasone-Salmeterol (ADVAIR) 250-50 MCG/DOSE AEPB Inhale 1 puff into the lungs in the morning and at bedtime. 60 each 5   Multiple Vitamin (MULTIVITAMIN) capsule Take 1 capsule by mouth daily.     sodium chloride (OCEAN) 0.65 % SOLN nasal spray Place 1 spray into both nostrils as needed for congestion. 15 mL 0   No current facility-administered medications on file prior to visit.    Review of Systems:  As per HPI- otherwise negative.   Physical Examination: Vitals:   06/17/21 0857  BP: (!) 142/82  Pulse: 67  Resp: 16  Temp: (!) 97.4 F (36.3 C)  SpO2: 97%   Vitals:   06/17/21 0857  Weight: 151 lb (68.5 kg)  Height: 5\' 1"  (1.549 m)   Body mass index is 28.53 kg/m. Ideal Body Weight: Weight in (lb) to have BMI = 25: 132  GEN: no acute distress.  Mild overweight, looks well HEENT: Atraumatic, Normocephalic.   Bilateral TM wnl, oropharynx normal.  PEERL,EOMI.   Ears and Nose: No external deformity. CV: RRR, No M/G/R. No JVD. No thrill. No extra heart sounds. PULM: CTA B, no wheezes, crackles, rhonchi. No retractions. No resp. distress. No accessory muscle use. ABD: S, NT, ND, +BS. No rebound. No HSM. EXTR: No c/c/e PSYCH: Normally interactive. Conversant.  Left ankle: Chronic postoperative/posttraumatic changes to the left ankle with reduced range of motion There is a small wound on her left index finger.  It is healing normally, no wound care is necessary but will update tetanus Assessment and Plan: Subclinical hyperthyroidism - Plan: TSH  Sarcoidosis - Plan: DG Chest 2  View  Pre-diabetes  Essential hypertension, benign - Plan: CBC, Basic metabolic panel  Vitamin D deficiency  COVID-19 - Plan: DG Chest 2 View  Open wound of finger, initial encounter - Plan: Td vaccine greater than or equal to 7yo preservative free IM Following up today for routine care Blood pressure under good control Update tetanus Will plan further follow- up  pending labs. She has noted some low-grade fevers, typically in the afternoon since she had COVID-19.  She also has a history of sarcoidosis.  We will update her chest x-ray today We presume that these fevers are simply sequelae of COVID-19.  She will let me know if these persist beyond a couple of weeks, or if getting worse This visit occurred during the SARS-CoV-2 public health emergency.  Safety protocols were in place, including screening questions prior to the visit, additional usage of staff PPE, and extensive cleaning of exam room while observing appropriate contact time as indicated for disinfecting solutions.   Signed Lamar Blinks, MD  Received her chest film results as below, message to patient DG Chest 2 View  Result Date: 06/17/2021 CLINICAL DATA:  Persistent fevers after COVID-19. Cough. History of sarcoid EXAM: CHEST - 2 VIEW COMPARISON:  None. FINDINGS: Normal heart size and mediastinal contours. No acute infiltrate or edema. No effusion or pneumothorax. No acute osseous findings. Calcified articular body below the coracoid process on the left. IMPRESSION: Baseline appearance of the chest.  No evidence of active disease. Electronically Signed   By: Monte Fantasia M.D.   On: 06/17/2021 10:10    Received her labs as below, message to pt  Results for orders placed or performed in visit on 06/17/21  CBC  Result Value Ref Range   WBC 6.5 4.0 - 10.5 K/uL   RBC 4.90 3.87 - 5.11 Mil/uL   Platelets 327.0 150.0 - 400.0 K/uL   Hemoglobin 14.4 12.0 - 15.0 g/dL   HCT 43.4 36.0 - 46.0 %   MCV 88.5 78.0 - 100.0 fl    MCHC 33.2 30.0 - 36.0 g/dL   RDW 15.0 11.5 - 19.4 %  Basic metabolic panel  Result Value Ref Range   Sodium 142 135 - 145 mEq/L   Potassium 4.6 3.5 - 5.1 mEq/L   Chloride 106 96 - 112 mEq/L   CO2 29 19 - 32 mEq/L   Glucose, Bld 94 70 - 99 mg/dL   BUN 15 6 - 23 mg/dL   Creatinine, Ser 0.74 0.40 - 1.20 mg/dL   GFR 80.19 >60.00 mL/min   Calcium 9.5 8.4 - 10.5 mg/dL  TSH  Result Value Ref Range   TSH 0.76 0.35 - 4.50 uIU/mL

## 2021-06-15 ENCOUNTER — Encounter: Payer: Self-pay | Admitting: Family Medicine

## 2021-06-17 ENCOUNTER — Ambulatory Visit (HOSPITAL_BASED_OUTPATIENT_CLINIC_OR_DEPARTMENT_OTHER)
Admission: RE | Admit: 2021-06-17 | Discharge: 2021-06-17 | Disposition: A | Discharge status: Home / Self Care | Payer: Medicare Other | Source: Ambulatory Visit | Attending: Family Medicine | Admitting: Family Medicine

## 2021-06-17 ENCOUNTER — Other Ambulatory Visit: Payer: Self-pay

## 2021-06-17 ENCOUNTER — Encounter: Payer: Self-pay | Admitting: Family Medicine

## 2021-06-17 ENCOUNTER — Ambulatory Visit (INDEPENDENT_AMBULATORY_CARE_PROVIDER_SITE_OTHER): Payer: Medicare Other | Admitting: Family Medicine

## 2021-06-17 VITALS — BP 142/82 | HR 67 | Temp 97.4°F | Resp 16 | Ht 61.0 in | Wt 151.0 lb

## 2021-06-17 DIAGNOSIS — D869 Sarcoidosis, unspecified: Secondary | ICD-10-CM

## 2021-06-17 DIAGNOSIS — S61209A Unspecified open wound of unspecified finger without damage to nail, initial encounter: Secondary | ICD-10-CM | POA: Diagnosis not present

## 2021-06-17 DIAGNOSIS — R059 Cough, unspecified: Secondary | ICD-10-CM | POA: Diagnosis not present

## 2021-06-17 DIAGNOSIS — R7303 Prediabetes: Secondary | ICD-10-CM

## 2021-06-17 DIAGNOSIS — R509 Fever, unspecified: Secondary | ICD-10-CM | POA: Diagnosis not present

## 2021-06-17 DIAGNOSIS — U071 COVID-19: Secondary | ICD-10-CM

## 2021-06-17 DIAGNOSIS — Z23 Encounter for immunization: Secondary | ICD-10-CM

## 2021-06-17 DIAGNOSIS — E559 Vitamin D deficiency, unspecified: Secondary | ICD-10-CM

## 2021-06-17 DIAGNOSIS — E0590 Thyrotoxicosis, unspecified without thyrotoxic crisis or storm: Secondary | ICD-10-CM

## 2021-06-17 DIAGNOSIS — I10 Essential (primary) hypertension: Secondary | ICD-10-CM | POA: Diagnosis not present

## 2021-06-17 LAB — CBC
HCT: 43.4 % (ref 36.0–46.0)
Hemoglobin: 14.4 g/dL (ref 12.0–15.0)
MCHC: 33.2 g/dL (ref 30.0–36.0)
MCV: 88.5 fl (ref 78.0–100.0)
Platelets: 327 10*3/uL (ref 150.0–400.0)
RBC: 4.9 Mil/uL (ref 3.87–5.11)
RDW: 15 % (ref 11.5–15.5)
WBC: 6.5 10*3/uL (ref 4.0–10.5)

## 2021-06-17 LAB — TSH: TSH: 0.76 u[IU]/mL (ref 0.35–4.50)

## 2021-06-17 LAB — BASIC METABOLIC PANEL
BUN: 15 mg/dL (ref 6–23)
CO2: 29 mEq/L (ref 19–32)
Calcium: 9.5 mg/dL (ref 8.4–10.5)
Chloride: 106 mEq/L (ref 96–112)
Creatinine, Ser: 0.74 mg/dL (ref 0.40–1.20)
GFR: 80.19 mL/min (ref >60.00)
Glucose, Bld: 94 mg/dL (ref 70–99)
Potassium: 4.6 mEq/L (ref 3.5–5.1)
Sodium: 142 mEq/L (ref 135–145)

## 2021-07-01 ENCOUNTER — Encounter: Payer: Self-pay | Admitting: Family Medicine

## 2021-08-10 DIAGNOSIS — Z01818 Encounter for other preprocedural examination: Secondary | ICD-10-CM | POA: Diagnosis not present

## 2021-08-10 DIAGNOSIS — M19172 Post-traumatic osteoarthritis, left ankle and foot: Secondary | ICD-10-CM | POA: Diagnosis not present

## 2021-08-11 DIAGNOSIS — M19172 Post-traumatic osteoarthritis, left ankle and foot: Secondary | ICD-10-CM | POA: Insufficient documentation

## 2021-08-15 ENCOUNTER — Encounter: Payer: Self-pay | Admitting: Family Medicine

## 2021-08-15 NOTE — Progress Notes (Signed)
[place] [place], [place], [place] [address] [phone]  Date:  08/19/2021   Name:  Kristina Mann   DOB:  [birth year]   MRN:  [id number]  PCP:  Darreld Mclean, MD    Chief Complaint: Foot Concern (Right corn/callous)   History of Present Illness:  Kristina Mann is a 75 y.o. very pleasant female patient who presents with the following:  Patient seen today with concern of a callus on her toe History of hypertension, sarcoidosis, prediabetes, chronic ankle swelling and stiffness due to fracture Most recent visit with myself was in June-at time she was looking at having an ankle replacement over the summer It got delayed as she had covid but will do the surgery in [place] He concern today is a painful callus on her right/ good foot It is on her pinky toe, where it rubs against the fourth toe. She has tried to treat this herself with corn pads and a pumice stone but it still bothers her  Otherwise she is doing well, her son is getting married in a few weeks Patient Active Problem List   Diagnosis Date Noted   Osteopenia 03/30/2017   Trigger finger, acquired 01/18/2017   Multinodular goiter 05/12/2016   Subclinical hyperthyroidism 05/12/2016   Pre-diabetes 06/16/2015   Low back pain 11/20/2013   Seasonal allergies 01/20/2012   Thyroid nodule 01/20/2012   Sarcoidosis 01/20/2012   HTN (hypertension) 01/20/2012   Basal cell cancer 01/20/2012    Past Medical History:  Diagnosis Date   Basal cell cancer 06/2005   Patient denies any cancer   Bronchitis    Chicken pox    GERD (gastroesophageal reflux disease)    Hypertension    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   Sarcoidosis    Seasonal allergies    Thyroid dysfunction     NODULE ABN CELLS 2006    Past Surgical History:  Procedure Laterality Date   [place] Left 2009   had 6 surgeries to repair and debride; and skin graft   [place]   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  2011    Social History   Tobacco Use   Smoking status: Never   Smokeless tobacco: Never  Vaping Use   Vaping Use: Never used  Substance Use Topics   Alcohol use: Yes    Alcohol/week: 0.0 - 1.0 standard drinks   Drug use: No    Family History  Problem Relation Age of Onset   Diabetes Mother    Cancer Mother        BREAST, THYROID   Dementia Mother    Heart disease Father        MI X3   Cancer Father        lung   COPD Father    Hypertension Sister    Hypertension Brother    Hypertension Son        mild    Colon cancer Neg Hx     Allergies  Allergen Reactions   Codeine Nausea And Vomiting    Medication list has been reviewed and updated.  Current Outpatient Medications on File Prior to Visit  Medication Sig Dispense Refill   calcium-vitamin D 250-100  MG-UNIT per tablet Take 1 tablet by mouth 2 (two) times daily.     Fluticasone-Salmeterol (ADVAIR) 250-50 MCG/DOSE AEPB Inhale 1 puff into the lungs in the morning and at bedtime. 60 each 5   Multiple Vitamin (MULTIVITAMIN) capsule Take 1 capsule by mouth daily.     sodium chloride (OCEAN) 0.65 % SOLN nasal spray Place 1 spray into both nostrils as needed for congestion. 15 mL 0   No current facility-administered medications on file prior to visit.    Review of Systems:  aortic stenosis   Physical Examination: Vitals:   08/19/21 1535  BP: 132/82  Pulse: 73  Resp: 17  Temp: (!) 97.4 F (36.3 C)  SpO2: 97%   Vitals:   08/19/21 1535  Weight: 155 lb (70.3 kg)  Height: '5\' 1"'$  (1.549 m)   Body mass index is 29.29 kg/m. Ideal Body Weight: Weight in (lb) to have BMI = 25: 132  GEN: no acute distress. HEENT: Atraumatic, Normocephalic.  Ears and Nose: No external  deformity. CV: RRR, No M/G/R. No JVD. No thrill. No extra heart sounds. PULM: CTA B, no wheezes, crackles, rhonchi. No retractions. No resp. distress. No accessory muscle use. EXTR: No c/c/e PSYCH: Normally interactive. Conversant.  Overweight, looks well and her normal self There is a bunion on the right foot which causes some malalignment of the rest of the toes There is a hard callus between the pinky and fourth toes, on the pinky toe.  I shaved the hard area with a 15 blade.  Patient tolerated well and felt more comfortable with the procedure.  No bleeding  Assessment and Plan: Callus of foot Patient seen today with a callus of her foot.  This is likely caused by toe malalignment and a bunion.  I have shaved off the hard skin today, hopefully this will give her some relief.  She will let me know if she needs anything else  This visit occurred during the SARS-CoV-2 public health emergency.  Safety protocols were in place, including screening questions prior to the visit, additional usage of staff PPE, and extensive cleaning of exam room while observing appropriate contact time as indicated for disinfecting solutions.   Signed Lamar Blinks, MD

## 2021-08-19 ENCOUNTER — Other Ambulatory Visit: Payer: Self-pay

## 2021-08-19 ENCOUNTER — Ambulatory Visit (INDEPENDENT_AMBULATORY_CARE_PROVIDER_SITE_OTHER): Payer: Medicare Other | Admitting: Family Medicine

## 2021-08-19 VITALS — BP 132/82 | HR 73 | Temp 97.4°F | Resp 17 | Ht 61.0 in | Wt 155.0 lb

## 2021-08-19 DIAGNOSIS — L84 Corns and callosities: Secondary | ICD-10-CM | POA: Diagnosis not present

## 2021-08-19 NOTE — Patient Instructions (Addendum)
We shaved down the callus on your toe today -let me know if you need anything further or if I can help

## 2021-09-27 ENCOUNTER — Encounter: Payer: Self-pay | Admitting: Family Medicine

## 2021-10-05 DIAGNOSIS — M19172 Post-traumatic osteoarthritis, left ankle and foot: Secondary | ICD-10-CM | POA: Diagnosis not present

## 2021-10-16 DIAGNOSIS — Z23 Encounter for immunization: Secondary | ICD-10-CM | POA: Diagnosis not present

## 2021-11-03 ENCOUNTER — Encounter: Payer: Self-pay | Admitting: Gastroenterology

## 2021-11-24 NOTE — Progress Notes (Signed)
Subjective:   Kristina Mann is a 75 y.o. female who presents for Medicare Annual (Subsequent) preventive examination.  Review of Systems     Cardiac Risk Factors include: advanced age (>46men, >83 women);hypertension     Objective:    Today's Vitals   11/26/21 0935  BP: 128/70  Pulse: 80  Resp: 16  Temp: 98.7 F (37.1 C)  TempSrc: Oral  SpO2: 96%  Weight: 154 lb 12.8 oz (70.2 kg)  Height: 5\' 1"  (1.549 m)   Body mass index is 29.25 kg/m.  Advanced Directives 11/26/2021 11/13/2020 10/30/2020 11/12/2019 11/07/2018 11/08/2014 10/24/2014  Does Patient Have a Medical Advance Directive? Yes Yes Yes Yes No No Yes  Type of Paramedic of [place];Living will [place];Living will [place];Living will [place];Living will - - Living will;Healthcare Power of Attorney  Does patient want to make changes to medical advance directive? - - No - Patient declined No - Patient declined - - -  Copy of [place] in Chart? Yes - validated most recent copy scanned in chart (See row information) Yes - validated most recent copy scanned in chart (See row information) No - copy requested Yes - validated most recent copy scanned in chart (See row information) - - -  Would patient like information on creating a medical advance directive? - - - - Yes (MAU/Ambulatory/Procedural Areas - Information given) No - patient declined information -    Current Medications (verified) Outpatient Encounter Medications as of 11/26/2021  Medication Sig   calcium-vitamin D 250-100 MG-UNIT per tablet Take 1 tablet by mouth 2 (two) times daily.   Fluticasone-Salmeterol (ADVAIR) 250-50 MCG/DOSE AEPB Inhale 1 puff into the lungs in the morning and at bedtime.   Multiple Vitamin (MULTIVITAMIN) capsule Take 1 capsule by mouth daily.   sodium chloride (OCEAN) 0.65 % SOLN nasal spray Place 1 spray into both nostrils as needed for  congestion.   No facility-administered encounter medications on file as of 11/26/2021.    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   Family History  Problem Relation Age of Onset   Diabetes Mother    Cancer Mother        BREAST, THYROID   Dementia Mother    Heart disease Father        MI X2   Cancer Father        lung   COPD Father    Hypertension Sister    Hypertension Brother    Hypertension Son        mild    Colon cancer Neg Hx    Social History  Socioeconomic History   Marital status: Married    Spouse name: Mallie Mussel   Number of children: 3   Years of education: 16   Highest education level: Not on file  Occupational History   Occupation: retired  Tobacco Use   Smoking status: Never   Smokeless tobacco: Never  Vaping Use   Vaping Use: Never used  Substance and Sexual Activity   Alcohol use: Yes    Alcohol/week: 0.0 - 1.0 standard drinks   Drug use: No   Sexual activity: Not Currently  Other Topics Concern   Not on file  Social History Narrative   O+ BLOOD DONATES Q 3 MONTHS.  EXERCISE-WALKS.   Mother in an assisted living facility in [place], where the patient's sister lives.   The patient lives with her husband.  Her son lives in [place], [place].   One  daughter lives in [place], the other in [place]. Patient does exercise.   Social Determinants of Health   Financial Resource Strain: Low Risk    Difficulty of Paying Living Expenses: Not hard at all  Food Insecurity: No Food Insecurity   Worried About Charity fundraiser in the Last Year: Never true   [place] in the Last Year: Never true  Transportation Needs: No Transportation Needs   Lack of Transportation (Medical): No   Lack of Transportation (Non-Medical): No  Physical Activity: Sufficiently Active   Days of Exercise per Week: 7 days   Minutes of Exercise per Session: 30 min  Stress: No Stress Concern Present   Feeling of Stress : Not at all  Social Connections: Socially Integrated   Frequency of Communication with Friends and Family: More than three times a week   Frequency of Social Gatherings with Friends and Family: More than three times a week   Attends Religious Services: More than 4 times per year   Active Member of [employer] or Organizations: Yes   Attends Music therapist: More than 4 times per year   Marital Status: Married    Tobacco Counseling Counseling given: Not Answered   Clinical Intake:  Pre-visit preparation completed: Yes  Pain : No/denies pain     BMI - recorded: 29.25 Nutritional Status: BMI 25 -29 Overweight Nutritional Risks: None Diabetes: No  How often do you need to have someone help you when you read instructions, pamphlets, or other written materials from your doctor or pharmacy?: 1 - Never  Diabetic?No  Interpreter Needed?: No  Information entered by :: Caroleen Hamman LPN   Activities of Daily Living In your present state of health, do you have any difficulty performing the following activities: 11/26/2021  Hearing? N  Vision? N  Difficulty concentrating or making decisions? N  Walking or climbing stairs? N  Dressing or bathing? N  Doing errands, shopping? N  Preparing Food and eating ? N  Using the  Toilet? N  In the past six months, have you accidently leaked urine? N  Do you have problems with loss of bowel control? N  Managing your Medications? N  Managing your Finances? N  Housekeeping or managing your Housekeeping? N  Some recent data might be hidden    Patient Care Team: Copland, Gay Filler, MD as PCP - General (Family Medicine) Amalia Greenhouse, MD (Endocrinology) Sharyne Peach, MD (Ophthalmology) Hurley Cisco, MD (Internal Medicine) Jovita Gamma, MD (Neurosurgery) Ladell Pier, MD (Internal Medicine)  Indicate any recent Medical Services you may have received from other than [facility] providers in  the past year (date may be approximate).     Assessment:   This is a routine wellness examination for Ardith.  Hearing/Vision screen Hearing Screening - Comments:: No issues Vision Screening - Comments:: 11/2020-Dr. Delman Cheadle  Dietary issues and exercise activities discussed: Current Exercise Habits: Home exercise routine, Type of exercise: walking;strength training/weights, Time (Minutes): 30, Frequency (Times/Week): 7, Weekly Exercise (Minutes/Week): 210, Intensity: Mild, Exercise limited by: orthopedic condition(s)   Goals Addressed             This Visit's Progress    Patient Stated   On track    Maintain healthy active lifestyle.     Patient Stated       Have ankle surgery to have left ankle replaced       Depression Screen PHQ 2/9 Scores 11/26/2021 07/01/2021 06/17/2021 11/13/2020 11/12/2019 11/07/2018 08/11/2017  PHQ - 2 Score 0 0 0 0 1 0 0  Exception Documentation - - - - - - Patient refusal    Fall Risk Fall Risk  11/26/2021 07/01/2021 06/17/2021 11/13/2020 11/21/2019  Falls in the past year? 0 1 1 1  0  Comment - - - - Emmi Telephone Survey: data to providers prior to load  Number falls in past yr: 0 - 0 0 -  Injury with Fall? 0 - 1 1 -  Risk for fall due to : - - - History of fall(s) -  Follow up Falls prevention discussed - - Falls prevention discussed  -    FALL RISK PREVENTION PERTAINING TO THE HOME:  Any stairs in or around the home? Yes  If so, are there any without handrails? No  Home free of loose throw rugs in walkways, pet beds, electrical cords, etc? Yes  Adequate lighting in your home to reduce risk of falls? Yes   ASSISTIVE DEVICES UTILIZED TO PREVENT FALLS:  Life alert? No  Use of a cane, walker or w/c? No  Grab bars in the bathroom? No  Shower chair or bench in shower? No  Elevated toilet seat or a handicapped toilet? No   TIMED UP AND GO:  Was the test performed? Yes .  Length of time to ambulate 10 feet: 10 sec.   Gait steady and fast without use of assistive device  Cognitive Function:Normal cognitive status assessed by direct observation by this Nurse Health Advisor. No abnormalities found.          Immunizations Immunization History  Administered Date(s) Administered   Fluad Quad(high Dose 65+) 11/20/2019   Influenza Whole 11/06/2011   Influenza, High Dose Seasonal PF 12/13/2016, 12/14/2017, 10/25/2018   Influenza, Seasonal, Injecte, Preservative Fre 02/19/2013   Influenza,inj,Quad PF,6+ Mos 12/29/2013, 09/16/2014, 09/17/2015   Influenza-Unspecified 09/30/2020, 10/16/2021   PFIZER(Purple Top)SARS-COV-2 Vaccination 02/21/2020, 03/12/2020, 09/30/2020   Pfizer Covid-19 Vaccine Bivalent Booster 37yrs & up 10/16/2021   Pneumococcal Conjugate-13 09/16/2014   Pneumococcal Polysaccharide-23 09/26/2004, 03/17/2015   Td 06/17/2021   Tdap 01/13/2011   Zoster Recombinat (Shingrix) 07/20/2021, 09/25/2021   Zoster, Live 03/17/2016    TDAP status: Up to date  Flu Vaccine status: Up to date  Pneumococcal vaccine status: Up to date  Covid-19 vaccine status: Completed vaccines  Qualifies for Shingles Vaccine? No   Zostavax completed Yes   Shingrix Completed?: Yes  Screening Tests Health Maintenance  Topic Date Due   URINE MICROALBUMIN  Never done   COLONOSCOPY (Pts 45-58yrs Insurance coverage will  need to be confirmed)  06/11/2022 (Originally 11/09/2019)   MAMMOGRAM  06/12/2023   TETANUS/TDAP  06/18/2031   Pneumonia Vaccine 84+ Years old  Completed   INFLUENZA VACCINE  Completed   DEXA SCAN  Completed   COVID-19 Vaccine  Completed   Hepatitis C Screening  Completed   Zoster Vaccines- Shingrix  Completed   HPV VACCINES  Aged Out    Health Maintenance  Health Maintenance Due  Topic Date Due   URINE MICROALBUMIN  Never done    Colorectal cancer screening: Type of screening: Colonoscopy. Completed 11/08/2014. Repeat every 8 years  Mammogram status: Completed bilateral 06/11/2021. Repeat every year  Bone Density status: Completed 06/11/2021. Results reflect: Bone density results: OSTEOPENIA. Repeat every 2 years.  Lung Cancer Screening: (Low Dose CT Chest recommended if Age 74-80 years, 30 pack-year currently smoking OR have quit w/in 15years.) does not qualify.     Additional Screening:  Hepatitis C Screening: Completed 09/17/2015  Vision Screening: Recommended annual ophthalmology exams for early detection of glaucoma and other disorders of the eye. Is the patient up to date with their annual eye exam?  Yes  Who is the provider or what is the name of the office in which the patient attends annual eye exams? Dr. Delman Cheadle   Dental Screening: Recommended annual dental exams for proper oral hygiene  Community Resource Referral / Chronic Care Management: CRR required this visit?  No   CCM required this visit?  No      Plan:     I have personally reviewed and noted the following in the patient's chart:   Medical and social history Use of alcohol, tobacco or illicit drugs  Current medications and supplements including opioid prescriptions.  Functional ability and status Nutritional status Physical activity Advanced directives List of other physicians Hospitalizations, surgeries, and ER visits in previous 12 months Vitals Screenings to include cognitive,  depression, and falls Referrals and appointments  In addition, I have reviewed and discussed with patient certain preventive protocols, quality metrics, and best practice recommendations. A written personalized care plan for preventive services as well as general preventive health recommendations were provided to patient.   Patient would like to access avs on mychart.  Marta Antu, LPN   19/03/1739  Nurse Health Advisor  Nurse Notes: None

## 2021-11-26 ENCOUNTER — Ambulatory Visit (INDEPENDENT_AMBULATORY_CARE_PROVIDER_SITE_OTHER): Payer: Medicare Other

## 2021-11-26 VITALS — BP 128/70 | HR 80 | Temp 98.7°F | Resp 16 | Ht 61.0 in | Wt 154.8 lb

## 2021-11-26 DIAGNOSIS — Z0000 Encounter for general adult medical examination without abnormal findings: Secondary | ICD-10-CM

## 2021-11-26 NOTE — Patient Instructions (Signed)
Kristina Mann , Thank you for taking time to come for your Medicare Wellness Visit. I appreciate your ongoing commitment to your health goals. Please review the following plan we discussed and let me know if I can assist you in the future.   Screening recommendations/referrals: Colonoscopy: Completed 11/08/2014-Due 11/10/2022 Mammogram: Completed 06/11/2021-Due 06/10/2022 Bone Density: Completed 06/11/2021-Due 06/11/2023 Recommended yearly ophthalmology/optometry visit for glaucoma screening and checkup Recommended yearly dental visit for hygiene and checkup  Vaccinations: Influenza vaccine: Up to date Pneumococcal vaccine: Up to date Tdap vaccine: Up to date Shingles vaccine: Completed vaccines   Covid-19:Up to date  Advanced directives: Copy in chart  Conditions/risks identified: See problem lisr  Next appointment: Follow up in one year for your annual wellness visit 12/02/2022 @ 9:00   Preventive Care 19 Years and Older, Female Preventive care refers to lifestyle choices and visits with your health care provider that can promote health and wellness. What does preventive care include? A yearly physical exam. This is also called an annual well check. Dental exams once or twice a year. Routine eye exams. Ask your health care provider how often you should have your eyes checked. Personal lifestyle choices, including: Daily care of your teeth and gums. Regular physical activity. Eating a healthy diet. Avoiding tobacco and drug use. Limiting alcohol use. Practicing safe sex. Taking low-dose aspirin every day. Taking vitamin and mineral supplements as recommended by your health care provider. What happens during an annual well check? The services and screenings done by your health care provider during your annual well check will depend on your age, overall health, lifestyle risk factors, and family history of disease. Counseling  Your health care provider may ask you questions about  your: Alcohol use. Tobacco use. Drug use. Emotional well-being. Home and relationship well-being. Sexual activity. Eating habits. History of falls. Memory and ability to understand (cognition). Work and work Statistician. Reproductive health. Screening  You may have the following tests or measurements: Height, weight, and BMI. Blood pressure. Lipid and cholesterol levels. These may be checked every 5 years, or more frequently if you are over 20 years old. Skin check. Lung cancer screening. You may have this screening every year starting at age 1 if you have a 30-pack-year history of smoking and currently smoke or have quit within the past 15 years. Fecal occult blood test (FOBT) of the stool. You may have this test every year starting at age 45. Flexible sigmoidoscopy or colonoscopy. You may have a sigmoidoscopy every 5 years or a colonoscopy every 10 years starting at age 70. Hepatitis C blood test. Hepatitis B blood test. Sexually transmitted disease (STD) testing. Diabetes screening. This is done by checking your blood sugar (glucose) after you have not eaten for a while (fasting). You may have this done every 1-3 years. Bone density scan. This is done to screen for osteoporosis. You may have this done starting at age 62. Mammogram. This may be done every 1-2 years. Talk to your health care provider about how often you should have regular mammograms. Talk with your health care provider about your test results, treatment options, and if necessary, the need for more tests. Vaccines  Your health care provider may recommend certain vaccines, such as: Influenza vaccine. This is recommended every year. Tetanus, diphtheria, and acellular pertussis (Tdap, Td) vaccine. You may need a Td booster every 10 years. Zoster vaccine. You may need this after age 76. Pneumococcal 13-valent conjugate (PCV13) vaccine. One dose is recommended after age 80. Pneumococcal polysaccharide (  PPSV23) vaccine.  One dose is recommended after age 59. Talk to your health care provider about which screenings and vaccines you need and how often you need them. This information is not intended to replace advice given to you by your health care provider. Make sure you discuss any questions you have with your health care provider. Document Released: 01/09/2016 Document Revised: 09/01/2016 Document Reviewed: 10/14/2015 Elsevier Interactive Patient Education  2017 [employer] Prevention in the Home Falls can cause injuries. They can happen to people of all ages. There are many things you can do to make your home safe and to help prevent falls. What can I do on the outside of my home? Regularly fix the edges of walkways and driveways and fix any cracks. Remove anything that might make you trip as you walk through a door, such as a raised step or threshold. Trim any bushes or trees on the path to your home. Use bright outdoor lighting. Clear any walking paths of anything that might make someone trip, such as rocks or tools. Regularly check to see if handrails are loose or broken. Make sure that both sides of any steps have handrails. Any raised decks and porches should have guardrails on the edges. Have any leaves, snow, or ice cleared regularly. Use sand or salt on walking paths during winter. Clean up any spills in your garage right away. This includes oil or grease spills. What can I do in the bathroom? Use night lights. Install grab bars by the toilet and in the tub and shower. Do not use towel bars as grab bars. Use non-skid mats or decals in the tub or shower. If you need to sit down in the shower, use a plastic, non-slip stool. Keep the floor dry. Clean up any water that spills on the floor as soon as it happens. Remove soap buildup in the tub or shower regularly. Attach bath mats securely with double-sided non-slip rug tape. Do not have throw rugs and other things on the floor that can make  you trip. What can I do in the bedroom? Use night lights. Make sure that you have a light by your bed that is easy to reach. Do not use any sheets or blankets that are too big for your bed. They should not hang down onto the floor. Have a firm chair that has side arms. You can use this for support while you get dressed. Do not have throw rugs and other things on the floor that can make you trip. What can I do in the kitchen? Clean up any spills right away. Avoid walking on wet floors. Keep items that you use a lot in easy-to-reach places. If you need to reach something above you, use a strong step stool that has a grab bar. Keep electrical cords out of the way. Do not use floor polish or wax that makes floors slippery. If you must use wax, use non-skid floor wax. Do not have throw rugs and other things on the floor that can make you trip. What can I do with my stairs? Do not leave any items on the stairs. Make sure that there are handrails on both sides of the stairs and use them. Fix handrails that are broken or loose. Make sure that handrails are as long as the stairways. Check any carpeting to make sure that it is firmly attached to the stairs. Fix any carpet that is loose or worn. Avoid having throw rugs at the top or  bottom of the stairs. If you do have throw rugs, attach them to the floor with carpet tape. Make sure that you have a light switch at the top of the stairs and the bottom of the stairs. If you do not have them, ask someone to add them for you. What else can I do to help prevent falls? Wear shoes that: Do not have high heels. Have rubber bottoms. Are comfortable and fit you well. Are closed at the toe. Do not wear sandals. If you use a stepladder: Make sure that it is fully opened. Do not climb a closed stepladder. Make sure that both sides of the stepladder are locked into place. Ask someone to hold it for you, if possible. Clearly mark and make sure that you can  see: Any grab bars or handrails. First and last steps. Where the edge of each step is. Use tools that help you move around (mobility aids) if they are needed. These include: Canes. Walkers. Scooters. Crutches. Turn on the lights when you go into a dark area. Replace any light bulbs as soon as they burn out. Set up your furniture so you have a clear path. Avoid moving your furniture around. If any of your floors are uneven, fix them. If there are any pets around you, be aware of where they are. Review your medicines with your doctor. Some medicines can make you feel dizzy. This can increase your chance of falling. Ask your doctor what other things that you can do to help prevent falls. This information is not intended to replace advice given to you by your health care provider. Make sure you discuss any questions you have with your health care provider. Document Released: 10/09/2009 Document Revised: 05/20/2016 Document Reviewed: 01/17/2015 Elsevier Interactive Patient Education  2017 [employer].

## 2021-12-10 DIAGNOSIS — Z20822 Contact with and (suspected) exposure to covid-19: Secondary | ICD-10-CM | POA: Diagnosis not present

## 2021-12-21 NOTE — Progress Notes (Deleted)
[place] at [facility] [address] [place], [place] [address] [phone]  Date:  12/23/2021   Name:  Kristina Mann   DOB:  1946/06/21   MRN:  [id number]  PCP:  Darreld Mclean, MD    Chief Complaint: No chief complaint on file.   History of Present Illness:  Kristina Mann is a 75 y.o. very pleasant female patient who presents with the following:  Patient seen today for 35-month follow-up visit  Most recent visit with myself was actually in August for foot callus.  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  Her ankle problem does make it harder for her to be as active as she would like.  There are plans for her to have an ankle replacement per [facility] but this has not happened yet  Colonoscopy is due in 2023 Mammogram up-to-date, vaccines up-to-date Some labs done in June-can update CMP Patient Active Problem List   Diagnosis Date Noted   Osteopenia 03/30/2017   Trigger finger, acquired 01/18/2017   Multinodular goiter 05/12/2016   Subclinical hyperthyroidism 05/12/2016   Pre-diabetes 06/16/2015   Low back pain 11/20/2013   Seasonal allergies 01/20/2012   Thyroid nodule 01/20/2012   Sarcoidosis 01/20/2012   HTN (hypertension) 01/20/2012   Basal cell cancer 01/20/2012    Past Medical History:  Diagnosis Date   Basal cell cancer 06/2005   Patient denies any cancer   Bronchitis    Chicken pox    GERD (gastroesophageal reflux disease)    Hypertension    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   Sarcoidosis    Seasonal allergies    Thyroid dysfunction     NODULE ABN CELLS 2006    Past Surgical History:  Procedure Laterality Date   [place] Left 2009   had 6 surgeries to repair and debride; and skin graft   [place]   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  2011    Social History   Tobacco Use   Smoking status: Never   Smokeless tobacco: Never  Vaping Use   Vaping Use: Never used  Substance Use Topics   Alcohol use: Yes    Alcohol/week: 0.0 - 1.0 standard drinks   Drug use: No    Family History  Problem Relation Age of Onset   Diabetes Mother    Cancer Mother        BREAST, THYROID   Dementia Mother    Heart disease Father        MI X3   Cancer Father        lung   COPD Father    Hypertension Sister    Hypertension Brother    Hypertension Son        mild    Colon cancer Neg Hx     Allergies  Allergen Reactions   Codeine Nausea And Vomiting    Medication list has been reviewed and updated.  Current Outpatient Medications on File Prior to Visit  Medication Sig Dispense Refill   calcium-vitamin D 250-100 MG-UNIT per tablet Take 1 tablet by mouth 2 (  two) times daily.     Fluticasone-Salmeterol (ADVAIR) 250-50 MCG/DOSE AEPB Inhale 1 puff into the lungs in the morning and at bedtime. 60 each 5   Multiple Vitamin (MULTIVITAMIN) capsule Take 1 capsule by mouth daily.     sodium chloride (OCEAN) 0.65 % SOLN nasal spray Place 1 spray into both nostrils as needed for congestion. 15 mL 0   No current facility-administered medications on file prior to visit.    Review of Systems:  As per HPI- otherwise negative.   Physical Examination: There were no vitals filed for this visit. There were no vitals filed for this visit. There is no height or weight on file to calculate BMI. Ideal Body Weight:    GEN: no acute distress. HEENT: Atraumatic, Normocephalic.  Ears and Nose: No external deformity. CV: RRR, No M/G/R. No JVD. No thrill. No extra heart sounds. PULM: CTA B, no wheezes, crackles, rhonchi.  No retractions. No resp. distress. No accessory muscle use. ABD: S, NT, ND, +BS. No rebound. No HSM. EXTR: No c/c/e PSYCH: Normally interactive. Conversant.    Assessment and Plan: ***  Signed Lamar Blinks, MD

## 2021-12-23 ENCOUNTER — Ambulatory Visit: Payer: Medicare Other | Admitting: Family Medicine

## 2021-12-27 DIAGNOSIS — Z20822 Contact with and (suspected) exposure to covid-19: Secondary | ICD-10-CM | POA: Diagnosis not present

## 2021-12-27 NOTE — Patient Instructions (Addendum)
Good to see you again today!  I will be in touch with your labs as soon as possible.  Flu and COVID testing negative today.  We are going to treat with Augmentin for bronchitis and possible pharyngitis.  You have enough medication for 10 days, if after a week you are feeling well okay to stop after 7 days

## 2021-12-27 NOTE — Progress Notes (Addendum)
[place] at [facility] [address], [place], [place] [address] [phone]  Date:  12/30/2021   Name:  Kristina Mann   DOB:  [birth year]   MRN:  [id number]  PCP:  Darreld Mclean, MD    Chief Complaint: 6 month follow up (Concerns/ questions: pt says she has been ill since last Thursday night. All covid test have been neg. /Flu shot: 10/16/21)   History of Present Illness:  Kristina Mann is a 76 y.o. very pleasant female patient who presents with the following:  Pt seen today for a 6 month visit but she is also ill today  Last seen by myself in August  History of prediabetes, hypertension, sarcoidosis, osteopenia, subclinical hyperthyroidism, chronic left ankle swelling due to fracture 2009.  Her ankle problem does make it harder for her to be as active as she would like.  Today, Kristina Mann states her ankle restricts her activities by 40 to 50%. However, she has exciting news-she is planned to have an ankle replacement per an orthopedic surgeon with [facility]  Surgery is scheduled for 2/17!    Dr Estanislado Pandy manages her sarcoidosis- generally asymptomatic   Sx started 7 days ago- she first noted a "cotton ball throat," cough She was around a lot of people over the holidays She has tested for covid- been negative- most recent test about 4 days She did not feel like she had the flu She has noted slightly elevated temp but no true fever No vomiting or diarrhea but decreased appetite She is still coughing - it can be productive She is not feeling much better  Lab Results  Component Value Date   HGBA1C 5.7 12/17/2020   Lab Results  Component Value Date   TSH 0.76 06/17/2021   Labs done in June - BMP, CBC    Patient Active Problem List   Diagnosis Date Noted   Osteopenia 03/30/2017   Trigger finger, acquired 01/18/2017   Multinodular goiter 05/12/2016   Subclinical hyperthyroidism 05/12/2016   Pre-diabetes 06/16/2015   Low back  pain 11/20/2013   Seasonal allergies 01/20/2012   Thyroid nodule 01/20/2012   Sarcoidosis 01/20/2012   HTN (hypertension) 01/20/2012   Basal cell cancer 01/20/2012    Past Medical History:  Diagnosis Date   Basal cell cancer 06/2005   Patient denies any cancer   Bronchitis    Chicken pox    GERD (gastroesophageal reflux disease)    Hypertension    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   Sarcoidosis    Seasonal allergies    Thyroid dysfunction     NODULE ABN CELLS 2006    Past Surgical History:  Procedure Laterality Date   ABDOMINAL HYSTERECTOMY  1992   APPENDECTOMY  1978   CARPAL TUNNEL RELEASE Left 1979   CARPAL TUNNEL RELEASE [place] Left 2009   had 6 surgeries to repair and debride; and skin graft   TONSILLECTOMY AND [place]   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  2011    Social History   Tobacco Use   Smoking status: Never   Smokeless tobacco: Never  Vaping Use   Vaping Use: Never used  Substance Use Topics   Alcohol use: Yes    Alcohol/week: 0.0 - 1.0 standard drinks   Drug use: No    Family History  Problem Relation Age of Onset   Diabetes  Mother    Cancer Mother        BREAST, THYROID   Dementia Mother    Heart disease Father        MI X3   Cancer Father        lung   COPD Father    Hypertension Sister    Hypertension Brother    Hypertension Son        mild    Colon cancer Neg Hx     Allergies  Allergen Reactions   Codeine Nausea And Vomiting    Medication list has been reviewed and updated.  Current Outpatient Medications on File Prior to Visit  Medication Sig Dispense Refill   calcium-vitamin D 250-100 MG-UNIT per tablet Take 1 tablet by mouth 2 (two) times daily.     Fluticasone-Salmeterol (ADVAIR) 250-50 MCG/DOSE AEPB Inhale 1 puff into the lungs in the morning and at bedtime. 60 each 5   Multiple Vitamin (MULTIVITAMIN) capsule Take 1 capsule by mouth  daily.     sodium chloride (OCEAN) 0.65 % SOLN nasal spray Place 1 spray into both nostrils as needed for congestion. 15 mL 0   No current facility-administered medications on file prior to visit.    Review of Systems:  As per HPI- otherwise negative.   Physical Examination: Vitals:   12/30/21 0854  BP: 120/80  Pulse: 68  Resp: 18  Temp: 98.1 F (36.7 C)  SpO2: 97%   Vitals:   12/30/21 0854  Weight: 158 lb (71.7 kg)  Height: 5\' 1"  (1.549 m)   Body mass index is 29.85 kg/m. Ideal Body Weight: Weight in (lb) to have BMI = 25: 132  GEN: no acute distress.  Overweight, looks well HEENT: Atraumatic, Normocephalic.  Bilateral TM wnl, oropharynx shows inflammation but no exudate, no evidence of abscess.  PEERL,EOMI.   Ears and Nose: No external deformity. CV: RRR, No M/G/R. No JVD. No thrill. No extra heart sounds. PULM: CTA B, no wheezes, crackles, rhonchi. No retractions. No resp. distress. No accessory muscle use. ABD: S, NT, ND. No rebound. No HSM. EXTR: No c/c/e PSYCH: Normally interactive. Conversant.   Results for orders placed or performed in visit on 12/30/21  POC COVID-19 BinaxNow  Result Value Ref Range   SARS Coronavirus 2 Ag Negative Negative  POCT Influenza A/B  Result Value Ref Range   Influenza A, POC Negative Negative   Influenza B, POC Negative Negative    Assessment and Plan: Pre-diabetes - Plan: Comprehensive metabolic panel, Hemoglobin A1c  Subclinical hyperthyroidism - Plan: TSH  Essential hypertension, benign  Malaise - Plan: POC COVID-19 BinaxNow, POCT Influenza A/B, amoxicillin-clavulanate (AUGMENTIN) 875-125 MG tablet  Screening for hyperlipidemia - Plan: Lipid panel  Acute bronchitis, unspecified organism - Plan: amoxicillin-clavulanate (AUGMENTIN) 875-125 MG tablet  Patient seen today for routine follow-up, labs are pending as above.  Blood pressure is under good control without any current medication  She has noted symptoms of  malaise, cough, sore throat for about a week.  Negative for COVID and flu today.  We will treat with Augmentin for 7 to 10 days  I have asked her to alert me if not improving in the next 2 to 3 days, sooner if worse.  Otherwise plan for routine follow-up in 6 months  Signed Lamar Blinks, MD  Received her labs as below, message to patient  Results for orders placed or performed in visit on 12/30/21  Comprehensive metabolic panel  Result Value Ref Range   Sodium 143  135 - 145 mEq/L   Potassium 4.4 3.5 - 5.1 mEq/L   Chloride 106 96 - 112 mEq/L   CO2 30 19 - 32 mEq/L   Glucose, Bld 99 70 - 99 mg/dL   BUN 15 6 - 23 mg/dL   Creatinine, Ser 0.75 0.40 - 1.20 mg/dL   Total Bilirubin 0.4 0.2 - 1.2 mg/dL   Alkaline Phosphatase 88 39 - 117 U/L   AST 23 0 - 37 U/L   ALT 26 0 - 35 U/L   Total Protein 6.4 6.0 - 8.3 g/dL   Albumin 4.1 3.5 - 5.2 g/dL   GFR 78.62 >60.00 mL/min   Calcium 9.2 8.4 - 10.5 mg/dL  Hemoglobin A1c  Result Value Ref Range   Hgb A1c MFr Bld 5.6 4.6 - 6.5 %  TSH  Result Value Ref Range   TSH 1.24 0.35 - 5.50 uIU/mL  Lipid panel  Result Value Ref Range   Cholesterol 206 (H) 0 - 200 mg/dL   Triglycerides 88.0 0.0 - 149.0 mg/dL   HDL 79.70 >39.00 mg/dL   VLDL 17.6 0.0 - 40.0 mg/dL   LDL Cholesterol 108 (H) 0 - 99 mg/dL   Total CHOL/HDL Ratio 3    NonHDL 125.91   POC COVID-19 BinaxNow  Result Value Ref Range   SARS Coronavirus 2 Ag Negative Negative  POCT Influenza A/B  Result Value Ref Range   Influenza A, POC Negative Negative   Influenza B, POC Negative Negative

## 2021-12-29 DIAGNOSIS — H5203 Hypermetropia, bilateral: Secondary | ICD-10-CM | POA: Diagnosis not present

## 2021-12-29 DIAGNOSIS — H2513 Age-related nuclear cataract, bilateral: Secondary | ICD-10-CM | POA: Diagnosis not present

## 2021-12-30 ENCOUNTER — Encounter: Payer: Self-pay | Admitting: Family Medicine

## 2021-12-30 ENCOUNTER — Ambulatory Visit (INDEPENDENT_AMBULATORY_CARE_PROVIDER_SITE_OTHER): Payer: Medicare Other | Admitting: Family Medicine

## 2021-12-30 VITALS — BP 120/80 | HR 68 | Temp 98.1°F | Resp 18 | Ht 61.0 in | Wt 158.0 lb

## 2021-12-30 DIAGNOSIS — R5381 Other malaise: Secondary | ICD-10-CM | POA: Diagnosis not present

## 2021-12-30 DIAGNOSIS — J209 Acute bronchitis, unspecified: Secondary | ICD-10-CM

## 2021-12-30 DIAGNOSIS — Z13220 Encounter for screening for lipoid disorders: Secondary | ICD-10-CM

## 2021-12-30 DIAGNOSIS — R7303 Prediabetes: Secondary | ICD-10-CM | POA: Diagnosis not present

## 2021-12-30 DIAGNOSIS — I10 Essential (primary) hypertension: Secondary | ICD-10-CM

## 2021-12-30 DIAGNOSIS — E0590 Thyrotoxicosis, unspecified without thyrotoxic crisis or storm: Secondary | ICD-10-CM | POA: Diagnosis not present

## 2021-12-30 LAB — COMPREHENSIVE METABOLIC PANEL
ALT: 26 U/L (ref 0–35)
AST: 23 U/L (ref 0–37)
Albumin: 4.1 g/dL (ref 3.5–5.2)
Alkaline Phosphatase: 88 U/L (ref 39–117)
BUN: 15 mg/dL (ref 6–23)
CO2: 30 mEq/L (ref 19–32)
Calcium: 9.2 mg/dL (ref 8.4–10.5)
Chloride: 106 mEq/L (ref 96–112)
Creatinine, Ser: 0.75 mg/dL (ref 0.40–1.20)
GFR: 78.62 mL/min (ref >60.00)
Glucose, Bld: 99 mg/dL (ref 70–99)
Potassium: 4.4 mEq/L (ref 3.5–5.1)
Sodium: 143 mEq/L (ref 135–145)
Total Bilirubin: 0.4 mg/dL (ref 0.2–1.2)
Total Protein: 6.4 g/dL (ref 6.0–8.3)

## 2021-12-30 LAB — HEMOGLOBIN A1C: Hgb A1c MFr Bld: 5.6 % (ref 4.6–6.5)

## 2021-12-30 LAB — TSH: TSH: 1.24 u[IU]/mL (ref 0.35–5.50)

## 2021-12-30 LAB — LIPID PANEL
Cholesterol: 206 mg/dL — ABNORMAL HIGH (ref 0–200)
HDL: 79.7 mg/dL (ref >39.00)
LDL Cholesterol: 108 mg/dL — ABNORMAL HIGH (ref 0–99)
NonHDL: 125.91
Total CHOL/HDL Ratio: 3
Triglycerides: 88 mg/dL (ref 0.0–149.0)
VLDL: 17.6 mg/dL (ref 0.0–40.0)

## 2021-12-30 LAB — POC COVID19 BINAXNOW: SARS Coronavirus 2 Ag: NEGATIVE

## 2021-12-30 LAB — POCT INFLUENZA A/B
Influenza A, POC: NEGATIVE
Influenza B, POC: NEGATIVE

## 2021-12-30 MED ORDER — AMOXICILLIN-POT CLAVULANATE 875-125 MG PO TABS: 1.0000 | ORAL_TABLET | Freq: Two times a day (BID) | ORAL | 0 refills | Status: DC

## 2021-12-31 MED ORDER — FLUTICASONE-SALMETEROL 250-50 MCG/ACT IN AEPB: 1.0000 | INHALATION_SPRAY | Freq: Two times a day (BID) | RESPIRATORY_TRACT | 5 refills | Status: DC

## 2021-12-31 MED ORDER — BENZONATATE 100 MG PO CAPS: 100.0000 mg | ORAL_CAPSULE | Freq: Three times a day (TID) | ORAL | 1 refills | Status: DC | PRN

## 2021-12-31 NOTE — Addendum Note (Signed)
Addended byDamita Dunnings D on: 12/31/2021 10:31 AM   Modules accepted: Orders

## 2022-01-27 DIAGNOSIS — Z20822 Contact with and (suspected) exposure to covid-19: Secondary | ICD-10-CM | POA: Diagnosis not present

## 2022-01-29 ENCOUNTER — Other Ambulatory Visit (HOSPITAL_BASED_OUTPATIENT_CLINIC_OR_DEPARTMENT_OTHER): Payer: Self-pay

## 2022-02-01 DIAGNOSIS — M19172 Post-traumatic osteoarthritis, left ankle and foot: Secondary | ICD-10-CM | POA: Diagnosis not present

## 2022-02-01 DIAGNOSIS — R7303 Prediabetes: Secondary | ICD-10-CM | POA: Diagnosis not present

## 2022-02-01 DIAGNOSIS — D869 Sarcoidosis, unspecified: Secondary | ICD-10-CM | POA: Diagnosis not present

## 2022-02-01 DIAGNOSIS — E042 Nontoxic multinodular goiter: Secondary | ICD-10-CM | POA: Diagnosis not present

## 2022-02-01 DIAGNOSIS — I10 Essential (primary) hypertension: Secondary | ICD-10-CM | POA: Diagnosis not present

## 2022-02-01 DIAGNOSIS — E041 Nontoxic single thyroid nodule: Secondary | ICD-10-CM | POA: Diagnosis not present

## 2022-02-01 DIAGNOSIS — M8580 Other specified disorders of bone density and structure, unspecified site: Secondary | ICD-10-CM | POA: Diagnosis not present

## 2022-02-10 DIAGNOSIS — Z20822 Contact with and (suspected) exposure to covid-19: Secondary | ICD-10-CM | POA: Diagnosis not present

## 2022-02-12 DIAGNOSIS — E042 Nontoxic multinodular goiter: Secondary | ICD-10-CM | POA: Diagnosis not present

## 2022-02-12 DIAGNOSIS — Z8249 Family history of ischemic heart disease and other diseases of the circulatory system: Secondary | ICD-10-CM | POA: Diagnosis not present

## 2022-02-12 DIAGNOSIS — Z9889 Other specified postprocedural states: Secondary | ICD-10-CM | POA: Diagnosis not present

## 2022-02-12 DIAGNOSIS — M19172 Post-traumatic osteoarthritis, left ankle and foot: Secondary | ICD-10-CM | POA: Diagnosis not present

## 2022-02-12 DIAGNOSIS — M8580 Other specified disorders of bone density and structure, unspecified site: Secondary | ICD-10-CM | POA: Diagnosis not present

## 2022-02-12 DIAGNOSIS — D869 Sarcoidosis, unspecified: Secondary | ICD-10-CM | POA: Diagnosis not present

## 2022-02-12 DIAGNOSIS — R7303 Prediabetes: Secondary | ICD-10-CM | POA: Diagnosis not present

## 2022-02-12 DIAGNOSIS — K219 Gastro-esophageal reflux disease without esophagitis: Secondary | ICD-10-CM | POA: Diagnosis not present

## 2022-02-12 DIAGNOSIS — I10 Essential (primary) hypertension: Secondary | ICD-10-CM | POA: Diagnosis not present

## 2022-02-12 DIAGNOSIS — G8918 Other acute postprocedural pain: Secondary | ICD-10-CM | POA: Diagnosis not present

## 2022-02-12 DIAGNOSIS — Z7722 Contact with and (suspected) exposure to environmental tobacco smoke (acute) (chronic): Secondary | ICD-10-CM | POA: Diagnosis not present

## 2022-02-12 DIAGNOSIS — E0590 Thyrotoxicosis, unspecified without thyrotoxic crisis or storm: Secondary | ICD-10-CM | POA: Diagnosis not present

## 2022-02-12 DIAGNOSIS — J45909 Unspecified asthma, uncomplicated: Secondary | ICD-10-CM | POA: Diagnosis not present

## 2022-02-12 DIAGNOSIS — Z9049 Acquired absence of other specified parts of digestive tract: Secondary | ICD-10-CM | POA: Diagnosis not present

## 2022-02-12 HISTORY — PX: TOTAL ANKLE REPLACEMENT: SUR1218

## 2022-02-13 DIAGNOSIS — E0590 Thyrotoxicosis, unspecified without thyrotoxic crisis or storm: Secondary | ICD-10-CM | POA: Diagnosis not present

## 2022-02-13 DIAGNOSIS — E042 Nontoxic multinodular goiter: Secondary | ICD-10-CM | POA: Diagnosis not present

## 2022-02-13 DIAGNOSIS — K219 Gastro-esophageal reflux disease without esophagitis: Secondary | ICD-10-CM | POA: Diagnosis not present

## 2022-02-13 DIAGNOSIS — J45909 Unspecified asthma, uncomplicated: Secondary | ICD-10-CM | POA: Diagnosis not present

## 2022-02-13 DIAGNOSIS — I10 Essential (primary) hypertension: Secondary | ICD-10-CM | POA: Diagnosis not present

## 2022-02-13 DIAGNOSIS — M19172 Post-traumatic osteoarthritis, left ankle and foot: Secondary | ICD-10-CM | POA: Diagnosis not present

## 2022-02-24 DIAGNOSIS — Z20822 Contact with and (suspected) exposure to covid-19: Secondary | ICD-10-CM | POA: Diagnosis not present

## 2022-03-05 DIAGNOSIS — Z4789 Encounter for other orthopedic aftercare: Secondary | ICD-10-CM | POA: Diagnosis not present

## 2022-03-05 DIAGNOSIS — M19172 Post-traumatic osteoarthritis, left ankle and foot: Secondary | ICD-10-CM | POA: Diagnosis not present

## 2022-03-10 ENCOUNTER — Other Ambulatory Visit (HOSPITAL_BASED_OUTPATIENT_CLINIC_OR_DEPARTMENT_OTHER): Payer: Self-pay

## 2022-03-26 DIAGNOSIS — Z4789 Encounter for other orthopedic aftercare: Secondary | ICD-10-CM | POA: Diagnosis not present

## 2022-03-26 DIAGNOSIS — M19172 Post-traumatic osteoarthritis, left ankle and foot: Secondary | ICD-10-CM | POA: Diagnosis not present

## 2022-05-10 DIAGNOSIS — M19172 Post-traumatic osteoarthritis, left ankle and foot: Secondary | ICD-10-CM | POA: Diagnosis not present

## 2022-06-03 DIAGNOSIS — R262 Difficulty in walking, not elsewhere classified: Secondary | ICD-10-CM | POA: Diagnosis not present

## 2022-06-03 DIAGNOSIS — Z4789 Encounter for other orthopedic aftercare: Secondary | ICD-10-CM | POA: Diagnosis not present

## 2022-06-03 DIAGNOSIS — M25672 Stiffness of left ankle, not elsewhere classified: Secondary | ICD-10-CM | POA: Diagnosis not present

## 2022-06-07 DIAGNOSIS — R262 Difficulty in walking, not elsewhere classified: Secondary | ICD-10-CM | POA: Diagnosis not present

## 2022-06-07 DIAGNOSIS — Z4789 Encounter for other orthopedic aftercare: Secondary | ICD-10-CM | POA: Diagnosis not present

## 2022-06-07 DIAGNOSIS — M25672 Stiffness of left ankle, not elsewhere classified: Secondary | ICD-10-CM | POA: Diagnosis not present

## 2022-06-09 DIAGNOSIS — M25672 Stiffness of left ankle, not elsewhere classified: Secondary | ICD-10-CM | POA: Diagnosis not present

## 2022-06-09 DIAGNOSIS — R262 Difficulty in walking, not elsewhere classified: Secondary | ICD-10-CM | POA: Diagnosis not present

## 2022-06-09 DIAGNOSIS — Z4789 Encounter for other orthopedic aftercare: Secondary | ICD-10-CM | POA: Diagnosis not present

## 2022-06-14 DIAGNOSIS — M25672 Stiffness of left ankle, not elsewhere classified: Secondary | ICD-10-CM | POA: Diagnosis not present

## 2022-06-14 DIAGNOSIS — Z4789 Encounter for other orthopedic aftercare: Secondary | ICD-10-CM | POA: Diagnosis not present

## 2022-06-14 DIAGNOSIS — R262 Difficulty in walking, not elsewhere classified: Secondary | ICD-10-CM | POA: Diagnosis not present

## 2022-06-16 DIAGNOSIS — Z4789 Encounter for other orthopedic aftercare: Secondary | ICD-10-CM | POA: Diagnosis not present

## 2022-06-16 DIAGNOSIS — R262 Difficulty in walking, not elsewhere classified: Secondary | ICD-10-CM | POA: Diagnosis not present

## 2022-06-16 DIAGNOSIS — M25672 Stiffness of left ankle, not elsewhere classified: Secondary | ICD-10-CM | POA: Diagnosis not present

## 2022-06-17 DIAGNOSIS — Z1231 Encounter for screening mammogram for malignant neoplasm of breast: Secondary | ICD-10-CM | POA: Diagnosis not present

## 2022-06-17 LAB — HM MAMMOGRAPHY

## 2022-06-21 DIAGNOSIS — M25672 Stiffness of left ankle, not elsewhere classified: Secondary | ICD-10-CM | POA: Diagnosis not present

## 2022-06-21 DIAGNOSIS — R262 Difficulty in walking, not elsewhere classified: Secondary | ICD-10-CM | POA: Diagnosis not present

## 2022-06-21 DIAGNOSIS — Z4789 Encounter for other orthopedic aftercare: Secondary | ICD-10-CM | POA: Diagnosis not present

## 2022-06-23 DIAGNOSIS — M25672 Stiffness of left ankle, not elsewhere classified: Secondary | ICD-10-CM | POA: Diagnosis not present

## 2022-06-23 DIAGNOSIS — R262 Difficulty in walking, not elsewhere classified: Secondary | ICD-10-CM | POA: Diagnosis not present

## 2022-06-23 DIAGNOSIS — Z4789 Encounter for other orthopedic aftercare: Secondary | ICD-10-CM | POA: Diagnosis not present

## 2022-06-28 DIAGNOSIS — M25672 Stiffness of left ankle, not elsewhere classified: Secondary | ICD-10-CM | POA: Diagnosis not present

## 2022-06-28 DIAGNOSIS — Z4789 Encounter for other orthopedic aftercare: Secondary | ICD-10-CM | POA: Diagnosis not present

## 2022-06-28 DIAGNOSIS — R262 Difficulty in walking, not elsewhere classified: Secondary | ICD-10-CM | POA: Diagnosis not present

## 2022-06-30 DIAGNOSIS — Z4789 Encounter for other orthopedic aftercare: Secondary | ICD-10-CM | POA: Diagnosis not present

## 2022-06-30 DIAGNOSIS — M25672 Stiffness of left ankle, not elsewhere classified: Secondary | ICD-10-CM | POA: Diagnosis not present

## 2022-06-30 DIAGNOSIS — R262 Difficulty in walking, not elsewhere classified: Secondary | ICD-10-CM | POA: Diagnosis not present

## 2022-07-05 ENCOUNTER — Ambulatory Visit: Payer: Medicare Other | Admitting: Family Medicine

## 2022-07-05 DIAGNOSIS — Z4789 Encounter for other orthopedic aftercare: Secondary | ICD-10-CM | POA: Diagnosis not present

## 2022-07-05 DIAGNOSIS — R262 Difficulty in walking, not elsewhere classified: Secondary | ICD-10-CM | POA: Diagnosis not present

## 2022-07-05 DIAGNOSIS — M25672 Stiffness of left ankle, not elsewhere classified: Secondary | ICD-10-CM | POA: Diagnosis not present

## 2022-07-07 DIAGNOSIS — R262 Difficulty in walking, not elsewhere classified: Secondary | ICD-10-CM | POA: Diagnosis not present

## 2022-07-07 DIAGNOSIS — M25672 Stiffness of left ankle, not elsewhere classified: Secondary | ICD-10-CM | POA: Diagnosis not present

## 2022-07-07 DIAGNOSIS — Z4789 Encounter for other orthopedic aftercare: Secondary | ICD-10-CM | POA: Diagnosis not present

## 2022-07-12 ENCOUNTER — Other Ambulatory Visit (HOSPITAL_BASED_OUTPATIENT_CLINIC_OR_DEPARTMENT_OTHER): Payer: Self-pay

## 2022-07-12 DIAGNOSIS — Z4789 Encounter for other orthopedic aftercare: Secondary | ICD-10-CM | POA: Diagnosis not present

## 2022-07-12 DIAGNOSIS — M25672 Stiffness of left ankle, not elsewhere classified: Secondary | ICD-10-CM | POA: Diagnosis not present

## 2022-07-12 DIAGNOSIS — R262 Difficulty in walking, not elsewhere classified: Secondary | ICD-10-CM | POA: Diagnosis not present

## 2022-07-14 DIAGNOSIS — Z4789 Encounter for other orthopedic aftercare: Secondary | ICD-10-CM | POA: Diagnosis not present

## 2022-07-14 DIAGNOSIS — M25672 Stiffness of left ankle, not elsewhere classified: Secondary | ICD-10-CM | POA: Diagnosis not present

## 2022-07-14 DIAGNOSIS — R262 Difficulty in walking, not elsewhere classified: Secondary | ICD-10-CM | POA: Diagnosis not present

## 2022-07-14 NOTE — Patient Instructions (Addendum)
It was great to see you again today, I will be in touch with your labs and we can decide what to do about the blood in your urine  Assuming all is well please see me in about 6 months  Give your Gi doc a call and set up screening

## 2022-07-14 NOTE — Progress Notes (Signed)
[facility] [employer] at [facility] [address] [place], [place] [address] [phone]  Date:  07/19/2022   Name:  Kristina Mann   DOB:  18-Oct-1946   MRN:  [id number]  PCP:  Darreld Mclean, MD    Chief Complaint: 6 month follow up (Concerns/ questions: 1. pt asks if there is anything that she can do about the scar she was left with after her ankle replacement?  2. R shoulder pain. 3.Hematuria. //)   History of Present Illness:  Kristina Mann is a 76 y.o. very pleasant female patient who presents with the following:  Patient seen today for periodic follow-up-most recent visit with myself was in January History of prediabetes, hypertension, sarcoidosis, osteopenia, subclinical hyperthyroidism, chronic left ankle swelling due to fracture 2009.  Her ankle problem does make it harder for her to be as active as she would like.   Her sarcoidosis is managed by rheumatology, Dr. Estanislado Pandy  She finally had her left ankle replacement per [facility] in February of this year-She notes nearly 100% reduction in her pain She has some scarring but otherwise is really happy Her activity level is better She finished PT  She can go up and down stairs now!   Colon cancer screening appears to be due- she will give them a call and set up screening.  She notes GI did reach out to her, but this was during her ankle replacement Mammogram up-to-date DEXA scan 1 year ago Can update lab work today  In March she noted gross blood in her urine once- this was accompanied by some burning with urination This has not occurred before or since  Never a smoker, neve had kidney stones   Patient Active Problem List   Diagnosis Date Noted   Osteopenia 03/30/2017   Trigger finger, acquired 01/18/2017   Multinodular goiter 05/12/2016   Subclinical hyperthyroidism 05/12/2016   Pre-diabetes 06/16/2015   Low back pain 11/20/2013   Seasonal allergies 01/20/2012   Thyroid  nodule 01/20/2012   Sarcoidosis 01/20/2012   HTN (hypertension) 01/20/2012   Basal cell cancer 01/20/2012    Past Medical History:  Diagnosis Date   Basal cell cancer 06/2005   Patient denies any cancer   Bronchitis    Chicken pox    GERD (gastroesophageal reflux disease)    Hypertension    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   Sarcoidosis    Seasonal allergies    Thyroid dysfunction     NODULE ABN CELLS 2006    Past Surgical History:  Procedure Laterality Date   [place] Left 2009   had 6 surgeries to repair and debride; and skin graft   TONSILLECTOMY AND [place]   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  2011    Social History   Tobacco Use   Smoking status: Never   Smokeless tobacco: Never  Vaping Use   Vaping Use: Never used  Substance Use Topics   Alcohol use: Yes    Alcohol/week: 0.0 - 1.0 standard drinks of alcohol   Drug use: No    Family History  Problem Relation Age of Onset   Diabetes Mother    Cancer Mother        BREAST, THYROID   Dementia  Mother    Heart disease Father        MI X3   Cancer Father        lung   COPD Father    Hypertension Sister    Hypertension Brother    Hypertension Son        mild    Colon cancer Neg Hx     Allergies  Allergen Reactions   Codeine Nausea And Vomiting    Medication list has been reviewed and updated.  Current Outpatient Medications on File Prior to Visit  Medication Sig Dispense Refill   calcium-vitamin D 250-100 MG-UNIT per tablet Take 1 tablet by mouth 2 (two) times daily.     fluticasone-salmeterol (ADVAIR) 250-50 MCG/ACT AEPB Inhale 1 puff into the lungs in the morning and at bedtime. 60 each 5   Multiple Vitamin (MULTIVITAMIN) capsule Take 1 capsule by mouth daily.     sodium chloride (OCEAN) 0.65 % SOLN nasal  spray Place 1 spray into both nostrils as needed for congestion. 15 mL 0   No current facility-administered medications on file prior to visit.    Review of Systems:  As per HPI- otherwise negative.   Physical Examination: Vitals:   07/19/22 0930  BP: 118/80  Pulse: 71  Resp: 18  Temp: 97.7 F (36.5 C)  SpO2: 98%   Vitals:   07/19/22 0930  Weight: 156 lb 6.4 oz (70.9 kg)  Height: '5\' 1"'$  (1.549 m)   Body mass index is 29.55 kg/m. Ideal Body Weight: Weight in (lb) to have BMI = 25: 132  GEN: no acute distress.  Overweight, looks well  HEENT: Atraumatic, Normocephalic.  Ears and Nose: No external deformity. CV: RRR, No M/G/R. No JVD. No thrill. No extra heart sounds. PULM: CTA B, no wheezes, crackles, rhonchi. No retractions. No resp. distress. No accessory muscle use. ABD: S, NT, ND No rebound. No HSM. EXTR: No c/c/e PSYCH: Normally interactive. Conversant.  Left ankle s/p total joint -there is gross abnormality of her ankle which is stable, a well-healing anterior incision/scar Range of motion of the ankle is much improved   Assessment and Plan: Pre-diabetes - Plan: Comprehensive metabolic panel, Hemoglobin A1c  Subclinical hyperthyroidism - Plan: TSH  Essential hypertension, benign - Plan: Comprehensive metabolic panel, CBC  Sarcoidosis  Gross hematuria - Plan: POCT urinalysis dipstick, Urine Culture  Seen today for follow-up She is doing great status post ankle replacement, we are so glad! Blood pressure looks good-no current medication Recheck thyroid and A1c today Patient had an episode of gross hematuria with no other symptoms.  UA today is negative.  I offered to refer her directly to urology versus getting some more information with a urine culture.  She would like to get a urine culture and take it from there Signed Lamar Blinks, MD  Received labs as below, message to patient Results for orders placed or performed in visit on 07/19/22   Comprehensive metabolic panel  Result Value Ref Range   Sodium 143 135 - 145 mEq/L   Potassium 4.4 3.5 - 5.1 mEq/L   Chloride 106 96 - 112 mEq/L   CO2 27 19 - 32 mEq/L   Glucose, Bld 93 70 - 99 mg/dL   BUN 13 6 - 23 mg/dL   Creatinine, Ser 0.69 0.40 - 1.20 mg/dL   Total Bilirubin 0.4 0.2 - 1.2 mg/dL   Alkaline Phosphatase 89 39 - 117 U/L   AST 17 0 - 37 U/L   ALT 17 0 -  35 U/L   Total Protein 6.5 6.0 - 8.3 g/dL   Albumin 4.5 3.5 - 5.2 g/dL   GFR 85.37 >60.00 mL/min   Calcium 9.3 8.4 - 10.5 mg/dL  CBC  Result Value Ref Range   WBC 5.9 4.0 - 10.5 K/uL   RBC 4.75 3.87 - 5.11 Mil/uL   Platelets 293.0 150.0 - 400.0 K/uL   Hemoglobin 14.5 12.0 - 15.0 g/dL   HCT 43.0 36.0 - 46.0 %   MCV 90.4 78.0 - 100.0 fl   MCHC 33.7 30.0 - 36.0 g/dL   RDW 14.3 11.5 - 15.5 %  Hemoglobin A1c  Result Value Ref Range   Hgb A1c MFr Bld 5.7 4.6 - 6.5 %  TSH  Result Value Ref Range   TSH 0.79 0.35 - 5.50 uIU/mL  POCT urinalysis dipstick  Result Value Ref Range   Color, UA yellow yellow   Clarity, UA cloudy (A) clear   Glucose, UA negative negative mg/dL   Bilirubin, UA negative negative   Ketones, POC UA negative negative mg/dL   Spec Grav, UA 1.010 1.010 - 1.025   Blood, UA negative negative   pH, UA 6.0 5.0 - 8.0   Protein Ur, POC negative negative mg/dL   Urobilinogen, UA 0.2 0.2 or 1.0 E.U./dL   Nitrite, UA Negative Negative   Leukocytes, UA Negative Negative

## 2022-07-19 ENCOUNTER — Encounter: Payer: Self-pay | Admitting: Family Medicine

## 2022-07-19 ENCOUNTER — Ambulatory Visit (INDEPENDENT_AMBULATORY_CARE_PROVIDER_SITE_OTHER): Payer: Medicare Other | Admitting: Family Medicine

## 2022-07-19 ENCOUNTER — Encounter: Payer: Self-pay | Admitting: Gastroenterology

## 2022-07-19 VITALS — BP 118/80 | HR 71 | Temp 97.7°F | Resp 18 | Ht 61.0 in | Wt 156.4 lb

## 2022-07-19 DIAGNOSIS — D869 Sarcoidosis, unspecified: Secondary | ICD-10-CM | POA: Diagnosis not present

## 2022-07-19 DIAGNOSIS — I10 Essential (primary) hypertension: Secondary | ICD-10-CM

## 2022-07-19 DIAGNOSIS — R310 Gross hematuria: Secondary | ICD-10-CM

## 2022-07-19 DIAGNOSIS — R7303 Prediabetes: Secondary | ICD-10-CM

## 2022-07-19 DIAGNOSIS — E0590 Thyrotoxicosis, unspecified without thyrotoxic crisis or storm: Secondary | ICD-10-CM | POA: Diagnosis not present

## 2022-07-19 LAB — POCT URINALYSIS DIP (MANUAL ENTRY)
Bilirubin, UA: NEGATIVE
Blood, UA: NEGATIVE
Glucose, UA: NEGATIVE mg/dL
Ketones, POC UA: NEGATIVE mg/dL
Leukocytes, UA: NEGATIVE
Nitrite, UA: NEGATIVE
Protein Ur, POC: NEGATIVE mg/dL
Spec Grav, UA: 1.01 (ref 1.010–1.025)
Urobilinogen, UA: 0.2 E.U./dL
pH, UA: 6 (ref 5.0–8.0)

## 2022-07-19 LAB — CBC
HCT: 43 % (ref 36.0–46.0)
Hemoglobin: 14.5 g/dL (ref 12.0–15.0)
MCHC: 33.7 g/dL (ref 30.0–36.0)
MCV: 90.4 fl (ref 78.0–100.0)
Platelets: 293 10*3/uL (ref 150.0–400.0)
RBC: 4.75 Mil/uL (ref 3.87–5.11)
RDW: 14.3 % (ref 11.5–15.5)
WBC: 5.9 10*3/uL (ref 4.0–10.5)

## 2022-07-19 LAB — COMPREHENSIVE METABOLIC PANEL
ALT: 17 U/L (ref 0–35)
AST: 17 U/L (ref 0–37)
Albumin: 4.5 g/dL (ref 3.5–5.2)
Alkaline Phosphatase: 89 U/L (ref 39–117)
BUN: 13 mg/dL (ref 6–23)
CO2: 27 mEq/L (ref 19–32)
Calcium: 9.3 mg/dL (ref 8.4–10.5)
Chloride: 106 mEq/L (ref 96–112)
Creatinine, Ser: 0.69 mg/dL (ref 0.40–1.20)
GFR: 85.37 mL/min (ref >60.00)
Glucose, Bld: 93 mg/dL (ref 70–99)
Potassium: 4.4 mEq/L (ref 3.5–5.1)
Sodium: 143 mEq/L (ref 135–145)
Total Bilirubin: 0.4 mg/dL (ref 0.2–1.2)
Total Protein: 6.5 g/dL (ref 6.0–8.3)

## 2022-07-19 LAB — TSH: TSH: 0.79 u[IU]/mL (ref 0.35–5.50)

## 2022-07-19 LAB — HEMOGLOBIN A1C: Hgb A1c MFr Bld: 5.7 % (ref 4.6–6.5)

## 2022-07-20 LAB — URINE CULTURE
MICRO NUMBER:: 13685264
Result:: NO GROWTH
SPECIMEN QUALITY:: ADEQUATE

## 2022-07-21 ENCOUNTER — Encounter: Payer: Self-pay | Admitting: Family Medicine

## 2022-07-21 DIAGNOSIS — R310 Gross hematuria: Secondary | ICD-10-CM

## 2022-07-26 ENCOUNTER — Encounter: Payer: Self-pay | Admitting: Family Medicine

## 2022-08-11 ENCOUNTER — Ambulatory Visit: Payer: Medicare Other | Admitting: Physician Assistant

## 2022-08-19 DIAGNOSIS — I10 Essential (primary) hypertension: Secondary | ICD-10-CM | POA: Diagnosis not present

## 2022-08-19 DIAGNOSIS — M7989 Other specified soft tissue disorders: Secondary | ICD-10-CM | POA: Diagnosis not present

## 2022-08-19 DIAGNOSIS — Z96662 Presence of left artificial ankle joint: Secondary | ICD-10-CM | POA: Diagnosis not present

## 2022-08-19 DIAGNOSIS — M19172 Post-traumatic osteoarthritis, left ankle and foot: Secondary | ICD-10-CM | POA: Diagnosis not present

## 2022-08-23 DIAGNOSIS — R310 Gross hematuria: Secondary | ICD-10-CM | POA: Diagnosis not present

## 2022-08-24 ENCOUNTER — Ambulatory Visit (AMBULATORY_SURGERY_CENTER): Payer: Medicare Other | Admitting: *Deleted

## 2022-08-24 VITALS — Ht 61.0 in | Wt 157.0 lb

## 2022-08-24 DIAGNOSIS — Z86010 Personal history of colonic polyps: Secondary | ICD-10-CM

## 2022-08-24 MED ORDER — NA SULFATE-K SULFATE-MG SULF 17.5-3.13-1.6 GM/177ML PO SOLN: 1.0000 | ORAL | 0 refills | Status: DC

## 2022-08-24 NOTE — Progress Notes (Signed)
Patient is here in-person for PV. Patient denies any allergies to eggs or soy. Patient denies any problems with anesthesia/sedation. Patient is not on any oxygen at home. Patient is not taking any diet/weight loss medications or blood thinners. Went over procedure prep instructions with the patient. Patient is aware of our care-partner policy. Patient notified to use Good[facility]Rx for prescription.    

## 2022-09-03 DIAGNOSIS — R319 Hematuria, unspecified: Secondary | ICD-10-CM | POA: Diagnosis not present

## 2022-09-03 DIAGNOSIS — N200 Calculus of kidney: Secondary | ICD-10-CM | POA: Diagnosis not present

## 2022-09-03 DIAGNOSIS — R310 Gross hematuria: Secondary | ICD-10-CM | POA: Diagnosis not present

## 2022-09-15 ENCOUNTER — Ambulatory Visit (AMBULATORY_SURGERY_CENTER): Payer: Medicare Other | Admitting: Gastroenterology

## 2022-09-15 ENCOUNTER — Encounter: Payer: Self-pay | Admitting: Gastroenterology

## 2022-09-15 VITALS — BP 143/75 | HR 76 | Temp 96.2°F | Resp 12 | Ht 61.0 in | Wt 157.0 lb

## 2022-09-15 DIAGNOSIS — D128 Benign neoplasm of rectum: Secondary | ICD-10-CM

## 2022-09-15 DIAGNOSIS — Z09 Encounter for follow-up examination after completed treatment for conditions other than malignant neoplasm: Secondary | ICD-10-CM

## 2022-09-15 DIAGNOSIS — Z86010 Personal history of colonic polyps: Secondary | ICD-10-CM

## 2022-09-15 DIAGNOSIS — D123 Benign neoplasm of transverse colon: Secondary | ICD-10-CM | POA: Diagnosis not present

## 2022-09-15 MED ORDER — SODIUM CHLORIDE 0.9 % IV SOLN: 500.0000 mL | Freq: Once | INTRAVENOUS | Status: DC

## 2022-09-15 NOTE — Progress Notes (Unsigned)
[facility] History and Physical   Primary Care Physician:  Copland, Gay Filler, MD   Reason for Procedure:  History of adenomatous colon polyps  Plan:    Surveillance colonoscopy with possible interventions as needed     HPI: Kristina Mann is a very pleasant 75 y.o. female here for surveillance colonoscopy. Denies any nausea, vomiting, abdominal pain, melena or bright red blood per rectum  The risks and benefits as well as alternatives of endoscopic procedure(s) have been discussed and reviewed. All questions answered. The patient agrees to proceed.    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   Sarcoidosis    Seasonal allergies    Thyroid dysfunction     NODULE ABN CELLS 2006    Past Surgical History:  Procedure Laterality Date   ABDOMINAL HYSTERECTOMY  1992   APPENDECTOMY  1978   CARPAL TUNNEL RELEASE Left 1979   CARPAL TUNNEL RELEASE Right 1980   COLONOSCOPY  11/08/2014   Dr.Brodie   GALLBLADDER SURGERY  1978   POLYPECTOMY     TIBIA FRACTURE SURGERY Left 2009   had 6 surgeries to repair and debride; and skin graft   TONSILLECTOMY AND ADENOIDECTOMY  1953   TOTAL ANKLE REPLACEMENT Left 02/12/2022   TUBAL LIGATION  1983   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 Authorizing Provider  ASHWAGANDHA PO Take by mouth.   Yes [provider]  B Complex-C-Folic Acid (HM SUPER VITAMIN B COMPLEX/C PO) Take by mouth.   Yes [provider]  calcium-vitamin D 250-100 MG-UNIT per tablet Take 1 tablet by mouth 2 (two) times daily.   Yes [provider]  fluticasone-salmeterol (ADVAIR) 250-50 MCG/ACT AEPB Inhale 1 puff into the lungs in  the morning and at bedtime. 12/31/21  Yes Copland, Gay Filler, MD  MAGNESIUM GLYCINATE PO Take by mouth.   Yes [provider]  Multiple Vitamin (MULTIVITAMIN) capsule Take 1 capsule by mouth daily.   Yes [provider]  Nutritional Supplements (OSTEO ADVANCE PO) Take by mouth.   Yes [provider]  Omega-3 Fatty Acids (OMEGA 3 PO) Take by mouth.   Yes [provider]  Probiotic Product (PROBIOTIC DAILY PO) Take by mouth.   Yes [provider]  Specialty Vitamins Products (RETAINE VISION PO) Take by mouth.   Yes [provider]  STRONTIUM GLUCONATE-B6-B12-FA PO Take by mouth.   Yes [provider]  Turmeric (QC TUMERIC COMPLEX PO) Take by mouth.   Yes [provider]  UNABLE TO FIND Med Name: Cholacol   Yes [provider]  amoxicillin (AMOXIL) 500 MG capsule SMARTSIG:4 Capsule(s) By Mouth Once Patient not taking: Reported on 08/24/2022 08/23/22   [provider]  Ferrous Fumarate (FERROCITE PO) Take by mouth.    [provider]  GINSENG PO Take by mouth.    [provider]    Current Outpatient Medications  Medication Sig Dispense Refill   ASHWAGANDHA PO Take by mouth.     B Complex-C-Folic Acid (HM SUPER VITAMIN B COMPLEX/C PO) Take by mouth.     calcium-vitamin D 250-100 MG-UNIT per tablet Take 1  tablet by mouth 2 (two) times daily.     fluticasone-salmeterol (ADVAIR) 250-50 MCG/ACT AEPB Inhale 1 puff into the lungs in the morning and at bedtime. 60 each 5   MAGNESIUM GLYCINATE PO Take by mouth.     Multiple Vitamin (MULTIVITAMIN) capsule Take 1 capsule by mouth daily.     Nutritional Supplements (OSTEO ADVANCE PO) Take by mouth.     Omega-3 Fatty Acids (OMEGA 3 PO) Take by mouth.     Probiotic Product (PROBIOTIC DAILY PO) Take by mouth.     Specialty Vitamins Products (RETAINE VISION PO) Take by mouth.     STRONTIUM GLUCONATE-B6-B12-FA PO Take by mouth.     Turmeric (QC TUMERIC  COMPLEX PO) Take by mouth.     UNABLE TO FIND Med Name: Cholacol     amoxicillin (AMOXIL) 500 MG capsule SMARTSIG:4 Capsule(s) By Mouth Once (Patient not taking: Reported on 08/24/2022)     Ferrous Fumarate (FERROCITE PO) Take by mouth.     GINSENG PO Take by mouth.     Current Facility-Administered Medications  Medication Dose Route Frequency Provider Last Rate Last Admin   0.9 %  sodium chloride infusion  500 mL Intravenous Once Mauri Pole, MD        Allergies as of 09/15/2022 - Review Complete 09/15/2022  Allergen Reaction Noted   Hornet venom Anaphylaxis 08/24/2022   Codeine Nausea And Vomiting 01/20/2012    Family History  Problem Relation Age of Onset   Diabetes Mother    Cancer Mother        BREAST, THYROID   Dementia Mother    Heart disease Father        MI X3   Cancer Father        lung   COPD Father    Hypertension Sister    Hypertension Brother    Hypertension Son        mild    Colon cancer Neg Hx    Esophageal cancer Neg Hx    Stomach cancer Neg Hx     Social History   Socioeconomic History   Marital status: Married    Spouse name: Mallie Mussel   Number of children: 3   Years of education: 46   Highest education level: Not on file  Occupational History   Occupation: retired  Tobacco Use   Smoking status: Never   Smokeless tobacco: Never  Vaping Use   Vaping Use: Never used  Substance and Sexual Activity   Alcohol use: Not Currently    Comment: occ wine   Drug use: No   Sexual activity: Not Currently  Other Topics Concern   Not on file  Social History Narrative   ** Merged History Encounter **       O+ BLOOD DONATES Q 3 MONTHS.  EXERCISE-WALKS.   Mother in an assisted living facility in [place], where the patient's sister lives.   The patient lives with her husband.  Her son lives in [place], [place].   One daughter lives in [place], the other in [place]. Patient does exercise.   Social Determinants of Health   Financial  Resource Strain: Low Risk  (11/26/2021)   Overall Financial Resource Strain (CARDIA)    Difficulty of Paying Living Expenses: Not hard at all  Food Insecurity: No Food Insecurity (11/26/2021)   Hunger Vital Sign    Worried About Running Out of Food in the Last Year: Never true    Ran Out of Food in the Last Year:  Never true  Transportation Needs: No Transportation Needs (11/26/2021)   PRAPARE - Hydrologist (Medical): No    Lack of Transportation (Non-Medical): No  Physical Activity: Sufficiently Active (11/26/2021)   Exercise Vital Sign    Days of Exercise per Week: 7 days    Minutes of Exercise per Session: 30 min  Stress: No Stress Concern Present (11/26/2021)   [place]    Feeling of Stress : Not at all  Social Connections: [place] (11/26/2021)   Social Connection and Isolation Panel [NHANES]    Frequency of Communication with Friends and Family: More than three times a week    Frequency of Social Gatherings with Friends and Family: More than three times a week    Attends Religious Services: More than 4 times per year    Active Member of [employer] or Organizations: Yes    Attends Music therapist: More than 4 times per year    Marital Status: Married  Human resources officer Violence: Not At Risk (11/26/2021)   Humiliation, Afraid, Rape, and Kick questionnaire    Fear of Current or Ex-Partner: No    Emotionally Abused: No    Physically Abused: No    Sexually Abused: No    Review of Systems:  All other review of systems negative except as mentioned in the HPI.  Physical Exam: Vital signs in last 24 hours: Blood Pressure (Abnormal) 159/76   Pulse 72   Temperature (Abnormal) 96.2 F (35.7 C)   Height '5\' 1"'$  (1.549 m)   Weight 157 lb (71.2 kg)   Oxygen Saturation 97%   Body Mass Index 29.66 kg/m  General:   Alert, NAD Lungs:  Clear .   Heart:  Regular rate and  rhythm Abdomen:  Soft, nontender and nondistended. Neuro/Psych:  Alert and cooperative. Normal mood and affect. A and O x 3  Reviewed labs, radiology imaging, old records and pertinent past GI work up  Patient is appropriate for planned procedure(s) and anesthesia in an ambulatory setting   K. Denzil Magnuson , MD [phone]

## 2022-09-15 NOTE — Progress Notes (Unsigned)
Called to room to assist during endoscopic procedure.  Patient ID and intended procedure confirmed with present staff. Received instructions for my participation in the procedure from the performing physician.  

## 2022-09-15 NOTE — Progress Notes (Unsigned)
Pt's states no medical or surgical changes since previsit or office visit. 

## 2022-09-15 NOTE — Op Note (Signed)
[name] Procedure Date: 09/15/2022 11:06 AM MRN: [id number] Endoscopist: Mauri Pole , MD Age: 76 Referring MD:  Date of Birth: [birth year] Gender: Female Account #: [id number] Procedure:                Colonoscopy Indications:              High risk colon cancer surveillance: Personal                            history of colonic polyps, High risk colon cancer                            surveillance: Personal history of adenoma less than                            10 mm in size Medicines:                Monitored Anesthesia Care Procedure:                Pre-Anesthesia Assessment:                           - Prior to the procedure, a History and Physical                            was performed, and patient medications and                            allergies were reviewed. The patient's tolerance of                            previous anesthesia was also reviewed. The risks                            and benefits of the procedure and the sedation                            options and risks were discussed with the patient.                            All questions were answered, and informed consent                            was obtained. Prior Anticoagulants: The patient has                            taken no previous anticoagulant or antiplatelet                            agents. ASA Grade Assessment: II - A patient with                            mild systemic disease. After reviewing the risks  and benefits, the patient was deemed in                            satisfactory condition to undergo the procedure.                           After obtaining informed consent, the colonoscope                            was passed under direct vision. Throughout the                            procedure, the patient's blood pressure, pulse, and                            oxygen saturations were monitored  continuously. The                            Olympus PCF-H190DL ([id number]) Colonoscope was                            introduced through the anus and advanced to the the                            cecum, identified by appendiceal orifice and                            ileocecal valve. The colonoscopy was performed                            without difficulty. The patient tolerated the                            procedure well. The quality of the bowel                            preparation was good. The ileocecal valve,                            appendiceal orifice, and rectum were photographed. Scope In: 11:09:55 AM Scope Out: 11:28:36 AM Scope Withdrawal Time: 0 hours 14 minutes 10 seconds  Total Procedure Duration: 0 hours 18 minutes 41 seconds  Findings:                 The perianal and digital rectal examinations were                            normal.                           Three sessile polyps were found in the rectum and                            transverse colon. The polyps were 4 to 6 mm in  size. These polyps were removed with a cold snare.                            Resection and retrieval were complete.                           Scattered small and large-mouthed diverticula were                            found in the sigmoid colon, transverse colon,                            ascending colon and cecum.                           Non-bleeding external and internal hemorrhoids were                            found during retroflexion. The hemorrhoids were                            medium-sized. Complications:            No immediate complications. Estimated Blood Loss:     Estimated blood loss was minimal. Impression:               - Three 4 to 6 mm polyps in the rectum and in the                            transverse colon, removed with a cold snare.                            Resected and retrieved.                           -  Diverticulosis in the sigmoid colon, in the                            transverse colon, in the ascending colon and in the                            cecum.                           - Non-bleeding external and internal hemorrhoids. Recommendation:           - Patient has a contact number available for                            emergencies. The signs and symptoms of potential                            delayed complications were discussed with the                            patient. Return to normal activities tomorrow.  Written discharge instructions were provided to the                            patient.                           - Resume previous diet.                           - Continue present medications.                           - Await pathology results.                           - Repeat colonoscopy date to be determined after                            pending pathology results are reviewed for                            surveillance based on pathology results. Mauri Pole, MD 09/15/2022 11:32:28 AM This report has been signed electronically.

## 2022-09-15 NOTE — Progress Notes (Unsigned)
Sedate, gd SR, tolerated procedure well, VSS, report to RN 

## 2022-09-15 NOTE — Patient Instructions (Signed)
Await pathology results.  Handout on hemorrhoids, diverticulosis and polyps given.  YOU HAD AN ENDOSCOPIC PROCEDURE TODAY AT [facility]:   Refer to the procedure report that was given to you for any specific questions about what was found during the examination.  If the procedure report does not answer your questions, please call your gastroenterologist to clarify.  If you requested that your care partner not be given the details of your procedure findings, then the procedure report has been included in a sealed envelope for you to review at your convenience later.  YOU SHOULD EXPECT: Some feelings of bloating in the abdomen. Passage of more gas than usual.  Walking can help get rid of the air that was put into your GI tract during the procedure and reduce the bloating. If you had a lower endoscopy (such as a colonoscopy or flexible sigmoidoscopy) you may notice spotting of blood in your stool or on the toilet paper. If you underwent a bowel prep for your procedure, you may not have a normal bowel movement for a few days.  Please Note:  You might notice some irritation and congestion in your nose or some drainage.  This is from the oxygen used during your procedure.  There is no need for concern and it should clear up in a day or so.  SYMPTOMS TO REPORT IMMEDIATELY:  Following lower endoscopy (colonoscopy or flexible sigmoidoscopy):  Excessive amounts of blood in the stool  Significant tenderness or worsening of abdominal pains  Swelling of the abdomen that is new, acute  Fever of 100F or higher  For urgent or emergent issues, a gastroenterologist can be reached at any hour by calling [phone]. Do not use MyChart messaging for urgent concerns.    DIET:  We do recommend a small meal at first, but then you may proceed to your regular diet.  Drink plenty of fluids but you should avoid alcoholic beverages for 24 hours.  ACTIVITY:  You should plan to take it easy for the  rest of today and you should NOT DRIVE or use heavy machinery until tomorrow (because of the sedation medicines used during the test).    FOLLOW UP: Our staff will call the number listed on your records the next business day following your procedure.  We will call around 7:15- 8:00 am to check on you and address any questions or concerns that you may have regarding the information given to you following your procedure. If we do not reach you, we will leave a message.     If any biopsies were taken you will be contacted by phone or by letter within the next 1-3 weeks.  Please call us at [phone] if you have not heard about the biopsies in 3 weeks.    SIGNATURES/CONFIDENTIALITY: You and/or your care partner have signed paperwork which will be entered into your electronic medical record.  These signatures attest to the fact that that the information above on your After Visit Summary has been reviewed and is understood.  Full responsibility of the confidentiality of this discharge information lies with you and/or your care-partner.

## 2022-09-16 ENCOUNTER — Encounter: Payer: Self-pay | Admitting: Family Medicine

## 2022-09-16 ENCOUNTER — Telehealth: Payer: Self-pay | Admitting: *Deleted

## 2022-09-16 ENCOUNTER — Other Ambulatory Visit: Payer: Self-pay | Admitting: Family Medicine

## 2022-09-16 ENCOUNTER — Encounter: Payer: Self-pay | Admitting: Gastroenterology

## 2022-09-16 DIAGNOSIS — K769 Liver disease, unspecified: Secondary | ICD-10-CM

## 2022-09-16 NOTE — Telephone Encounter (Signed)
  Follow up Call-     09/15/2022    9:44 AM  Call back number  Post procedure Call Back phone  # [phone]  Permission to leave phone message Yes   [facility]

## 2022-09-27 ENCOUNTER — Encounter: Payer: Self-pay | Admitting: Family Medicine

## 2022-09-27 DIAGNOSIS — Z23 Encounter for immunization: Secondary | ICD-10-CM | POA: Diagnosis not present

## 2022-09-27 NOTE — Telephone Encounter (Signed)
Please advise, immunizations updated.

## 2022-09-29 ENCOUNTER — Other Ambulatory Visit (HOSPITAL_BASED_OUTPATIENT_CLINIC_OR_DEPARTMENT_OTHER): Payer: Self-pay

## 2022-09-29 DIAGNOSIS — Z23 Encounter for immunization: Secondary | ICD-10-CM | POA: Diagnosis not present

## 2022-09-29 MED ORDER — INFLUENZA VAC A&B SA ADJ QUAD 0.5 ML IM PRSY
PREFILLED_SYRINGE | INTRAMUSCULAR | 0 refills | Status: DC
  Filled 2022-09-29: qty 0.5, 1d supply, fill #0

## 2022-09-29 MED ORDER — AREXVY 120 MCG/0.5ML IM SUSR
INTRAMUSCULAR | 0 refills | Status: DC
  Filled 2022-09-29: qty 0.5, 1d supply, fill #0

## 2022-09-30 ENCOUNTER — Encounter: Payer: Self-pay | Admitting: Gastroenterology

## 2022-10-06 ENCOUNTER — Ambulatory Visit
Admission: RE | Admit: 2022-10-06 | Discharge: 2022-10-06 | Disposition: A | Discharge status: Home / Self Care | Payer: Medicare Other | Source: Ambulatory Visit | Attending: Family Medicine | Admitting: Family Medicine

## 2022-10-06 DIAGNOSIS — K769 Liver disease, unspecified: Secondary | ICD-10-CM | POA: Diagnosis not present

## 2022-10-06 MED ORDER — GADOPICLENOL 0.5 MMOL/ML IV SOLN
7.0000 mL | Freq: Once | INTRAVENOUS | Status: AC | PRN
  Administered 2022-10-06: 7 mL via INTRAVENOUS

## 2022-10-27 DIAGNOSIS — R310 Gross hematuria: Secondary | ICD-10-CM | POA: Diagnosis not present

## 2022-11-03 DIAGNOSIS — I10 Essential (primary) hypertension: Secondary | ICD-10-CM | POA: Diagnosis not present

## 2022-11-03 DIAGNOSIS — E0590 Thyrotoxicosis, unspecified without thyrotoxic crisis or storm: Secondary | ICD-10-CM | POA: Diagnosis not present

## 2022-11-03 DIAGNOSIS — E042 Nontoxic multinodular goiter: Secondary | ICD-10-CM | POA: Diagnosis not present

## 2022-11-24 DIAGNOSIS — E041 Nontoxic single thyroid nodule: Secondary | ICD-10-CM | POA: Diagnosis not present

## 2022-11-24 DIAGNOSIS — E0590 Thyrotoxicosis, unspecified without thyrotoxic crisis or storm: Secondary | ICD-10-CM | POA: Diagnosis not present

## 2022-11-24 DIAGNOSIS — E042 Nontoxic multinodular goiter: Secondary | ICD-10-CM | POA: Diagnosis not present

## 2022-12-02 ENCOUNTER — Ambulatory Visit (INDEPENDENT_AMBULATORY_CARE_PROVIDER_SITE_OTHER): Payer: Medicare Other | Admitting: *Deleted

## 2022-12-02 VITALS — BP 127/76 | HR 73 | Ht 61.0 in | Wt 156.4 lb

## 2022-12-02 DIAGNOSIS — Z0000 Encounter for general adult medical examination without abnormal findings: Secondary | ICD-10-CM | POA: Diagnosis not present

## 2022-12-02 NOTE — Patient Instructions (Signed)
Kristina Mann , Thank you for taking time to come for your Medicare Wellness Visit. I appreciate your ongoing commitment to your health goals. Please review the following plan we discussed and let me know if I can assist you in the future.   These are the goals we discussed:  Goals      Patient Stated     Maintain healthy active lifestyle.     Patient Stated     Have ankle surgery to have left ankle replaced        This is a list of the screening recommended for you and due dates:  Health Maintenance  Topic Date Due   COVID-19 Vaccine (6 - 2023-24 season) 11/22/2022   Medicare Annual Wellness Visit  12/03/2023   DTaP/Tdap/Td vaccine (3 - Td or Tdap) 06/18/2031   Pneumonia Vaccine  Completed   Flu Shot  Completed   DEXA scan (bone density measurement)  Completed   Hepatitis C Screening: USPSTF Recommendation to screen - Ages 11-79 yo.  Completed   Zoster (Shingles) Vaccine  Completed   HPV Vaccine  Aged Out   Colon Cancer Screening  Discontinued     Next appointment: Follow up in one year for your annual wellness visit.   Preventive Care 32 Years and Older, Female Preventive care refers to lifestyle choices and visits with your health care provider that can promote health and wellness. What does preventive care include? A yearly physical exam. This is also called an annual well check. Dental exams once or twice a year. Routine eye exams. Ask your health care provider how often you should have your eyes checked. Personal lifestyle choices, including: Daily care of your teeth and gums. Regular physical activity. Eating a healthy diet. Avoiding tobacco and drug use. Limiting alcohol use. Practicing safe sex. Taking low-dose aspirin every day. Taking vitamin and mineral supplements as recommended by your health care provider. What happens during an annual well check? The services and screenings done by your health care provider during your annual well check will depend on  your age, overall health, lifestyle risk factors, and family history of disease. Counseling  Your health care provider may ask you questions about your: Alcohol use. Tobacco use. Drug use. Emotional well-being. Home and relationship well-being. Sexual activity. Eating habits. History of falls. Memory and ability to understand (cognition). Work and work Statistician. Reproductive health. Screening  You may have the following tests or measurements: Height, weight, and BMI. Blood pressure. Lipid and cholesterol levels. These may be checked every 5 years, or more frequently if you are over 8 years old. Skin check. Lung cancer screening. You may have this screening every year starting at age 60 if you have a 30-pack-year history of smoking and currently smoke or have quit within the past 15 years. Fecal occult blood test (FOBT) of the stool. You may have this test every year starting at age 7. Flexible sigmoidoscopy or colonoscopy. You may have a sigmoidoscopy every 5 years or a colonoscopy every 10 years starting at age 62. Hepatitis C blood test. Hepatitis B blood test. Sexually transmitted disease (STD) testing. Diabetes screening. This is done by checking your blood sugar (glucose) after you have not eaten for a while (fasting). You may have this done every 1-3 years. Bone density scan. This is done to screen for osteoporosis. You may have this done starting at age 61. Mammogram. This may be done every 1-2 years. Talk to your health care provider about how often you should  have regular mammograms. Talk with your health care provider about your test results, treatment options, and if necessary, the need for more tests. Vaccines  Your health care provider may recommend certain vaccines, such as: Influenza vaccine. This is recommended every year. Tetanus, diphtheria, and acellular pertussis (Tdap, Td) vaccine. You may need a Td booster every 10 years. Zoster vaccine. You may need this  after age 35. Pneumococcal 13-valent conjugate (PCV13) vaccine. One dose is recommended after age 73. Pneumococcal polysaccharide (PPSV23) vaccine. One dose is recommended after age 6. Talk to your health care provider about which screenings and vaccines you need and how often you need them. This information is not intended to replace advice given to you by your health care provider. Make sure you discuss any questions you have with your health care provider. Document Released: 01/09/2016 Document Revised: 09/01/2016 Document Reviewed: 10/14/2015 Elsevier Interactive Patient Education  2017 [place] Prevention in the Home Falls can cause injuries. They can happen to people of all ages. There are many things you can do to make your home safe and to help prevent falls. What can I do on the outside of my home? Regularly fix the edges of walkways and driveways and fix any cracks. Remove anything that might make you trip as you walk through a door, such as a raised step or threshold. Trim any bushes or trees on the path to your home. Use bright outdoor lighting. Clear any walking paths of anything that might make someone trip, such as rocks or tools. Regularly check to see if handrails are loose or broken. Make sure that both sides of any steps have handrails. Any raised decks and porches should have guardrails on the edges. Have any leaves, snow, or ice cleared regularly. Use sand or salt on walking paths during winter. Clean up any spills in your garage right away. This includes oil or grease spills. What can I do in the bathroom? Use night lights. Install grab bars by the toilet and in the tub and shower. Do not use towel bars as grab bars. Use non-skid mats or decals in the tub or shower. If you need to sit down in the shower, use a plastic, non-slip stool. Keep the floor dry. Clean up any water that spills on the floor as soon as it happens. Remove soap buildup in the tub or  shower regularly. Attach bath mats securely with double-sided non-slip rug tape. Do not have throw rugs and other things on the floor that can make you trip. What can I do in the bedroom? Use night lights. Make sure that you have a light by your bed that is easy to reach. Do not use any sheets or blankets that are too big for your bed. They should not hang down onto the floor. Have a firm chair that has side arms. You can use this for support while you get dressed. Do not have throw rugs and other things on the floor that can make you trip. What can I do in the kitchen? Clean up any spills right away. Avoid walking on wet floors. Keep items that you use a lot in easy-to-reach places. If you need to reach something above you, use a strong step stool that has a grab bar. Keep electrical cords out of the way. Do not use floor polish or wax that makes floors slippery. If you must use wax, use non-skid floor wax. Do not have throw rugs and other things on the floor  that can make you trip. What can I do with my stairs? Do not leave any items on the stairs. Make sure that there are handrails on both sides of the stairs and use them. Fix handrails that are broken or loose. Make sure that handrails are as long as the stairways. Check any carpeting to make sure that it is firmly attached to the stairs. Fix any carpet that is loose or worn. Avoid having throw rugs at the top or bottom of the stairs. If you do have throw rugs, attach them to the floor with carpet tape. Make sure that you have a light switch at the top of the stairs and the bottom of the stairs. If you do not have them, ask someone to add them for you. What else can I do to help prevent falls? Wear shoes that: Do not have high heels. Have rubber bottoms. Are comfortable and fit you well. Are closed at the toe. Do not wear sandals. If you use a stepladder: Make sure that it is fully opened. Do not climb a closed stepladder. Make  sure that both sides of the stepladder are locked into place. Ask someone to hold it for you, if possible. Clearly mark and make sure that you can see: Any grab bars or handrails. First and last steps. Where the edge of each step is. Use tools that help you move around (mobility aids) if they are needed. These include: Canes. Walkers. Scooters. Crutches. Turn on the lights when you go into a dark area. Replace any light bulbs as soon as they burn out. Set up your furniture so you have a clear path. Avoid moving your furniture around. If any of your floors are uneven, fix them. If there are any pets around you, be aware of where they are. Review your medicines with your doctor. Some medicines can make you feel dizzy. This can increase your chance of falling. Ask your doctor what other things that you can do to help prevent falls. This information is not intended to replace advice given to you by your health care provider. Make sure you discuss any questions you have with your health care provider. Document Released: 10/09/2009 Document Revised: 05/20/2016 Document Reviewed: 01/17/2015 Elsevier Interactive Patient Education  2017 [employer].

## 2022-12-02 NOTE — Progress Notes (Signed)
Subjective:   Kristina Mann is a 76 y.o. female who presents for Medicare Annual (Subsequent) preventive examination.  Review of Systems    Defer to PCP Cardiac Risk Factors include: advanced age (>40mn, >>38women)     Objective:    Today's Vitals   12/02/22 0903  BP: 127/76  Pulse: 73  Weight: 156 lb 6.4 oz (70.9 kg)  Height: '5\' 1"'$  (1.549 m)   Body mass index is 29.55 kg/m.     12/02/2022    9:05 AM 11/26/2021    9:43 AM 11/13/2020    9:46 AM 10/30/2020   12:42 PM 11/12/2019    9:09 AM 11/07/2018   10:19 AM 11/08/2014    9:14 AM  Advanced Directives  Does Patient Have a Medical Advance Directive? Yes Yes Yes Yes Yes No No  Type of AParamed[place]of [place]Living will [place]West [place]Living will [place]Living will [place]Living will [place]West Men[place] ParkLiving will    Does patient want to make changes to medical advance directive? No - Patient declined   No - Patient declined No - Patient declined    Copy of HCartwrightin Chart? Yes - validated most recent copy scanned in chart (See row information) Yes - validated most recent copy scanned in chart (See row information) Yes - validated most recent copy scanned in chart (See row information) No - copy requested Yes - validated most recent copy scanned in chart (See row information)    Would patient like information on creating a medical advance directive?      Yes (MAU/Ambulatory/Procedural Areas - Information given) No - patient declined information    Current Medications (verified) Outpatient Encounter Medications as of 12/02/2022  Medication Sig   ASHWAGANDHA PO Take by mouth.   B Complex-C-Folic Acid (HM SUPER VITAMIN B COMPLEX/C PO) Take by mouth.   calcium-vitamin D 250-100 MG-UNIT per tablet Take 1 tablet by mouth 2 (two) times daily.   Ferrous Fumarate (FERROCITE PO) Take by mouth.   fluticasone-salmeterol  (ADVAIR) 250-50 MCG/ACT AEPB Inhale 1 puff into the lungs in the morning and at bedtime.   MAGNESIUM GLYCINATE PO Take by mouth.   Multiple Vitamin (MULTIVITAMIN) capsule Take 1 capsule by mouth daily.   Nutritional Supplements (OSTEO ADVANCE PO) Take by mouth.   Omega-3 Fatty Acids (OMEGA 3 PO) Take by mouth.   Probiotic Product (PROBIOTIC DAILY PO) Take by mouth.   RSV vaccine recomb adjuvanted (AREXVY) 120 MCG/0.5ML injection Inject into the muscle.   Specialty Vitamins Products (RETAINE VISION PO) Take by mouth.   STRONTIUM GLUCONATE-B6-B12-FA PO Take by mouth.   Turmeric (QC TUMERIC COMPLEX PO) Take by mouth.   UNABLE TO FIND Med Name: Cholacol   [DISCONTINUED] influenza vaccine adjuvanted (FLUAD) 0.5 ML injection Inject into the muscle.   No facility-administered encounter medications on file as of 12/02/2022.    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   Sarcoidosis    Seasonal allergies    Thyroid dysfunction     NODULE ABN CELLS 2006   Past Surgical History:  Procedure Laterality Date  ABDOMINAL HYSTERECTOMY  1992   APPENDECTOMY  1978   CARPAL TUNNEL RELEASE Left 1979   CARPAL TUNNEL RELEASE Right 1980   COLONOSCOPY  11/08/2014   Dr.Brodie   GALLBLADDER SURGERY  1978   POLYPECTOMY     TIBIA FRACTURE SURGERY Left 2009   had 6 surgeries to repair and debride; and skin graft   TONSILLECTOMY AND ADENOIDECTOMY  1953   TOTAL ANKLE REPLACEMENT Left 02/12/2022   TUBAL LIGATION  1983   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  2011   Family History  Problem Relation Age of Onset   Diabetes Mother    Cancer Mother        BREAST, THYROID   Dementia Mother    Heart disease Father        MI X3    Cancer Father        lung   COPD Father    Hypertension Sister    Hypertension Brother    Hypertension Son        mild    Colon cancer Neg Hx    Esophageal cancer Neg Hx    Stomach cancer Neg Hx    Social History   Socioeconomic History   Marital status: Married    Spouse name: Mallie Mussel   Number of children: 3   Years of education: 16   Highest education level: Not on file  Occupational History   Occupation: retired  Tobacco Use   Smoking status: Never   Smokeless tobacco: Never  Vaping Use   Vaping Use: Never used  Substance and Sexual Activity   Alcohol use: Not Currently    Comment: occ wine   Drug use: No   Sexual activity: Not Currently  Other Topics Concern   Not on file  Social History Narrative   ** Merged History Encounter **       O+ BLOOD DONATES Q 3 MONTHS.  EXERCISE-WALKS.   Mother in an assisted living facility in [place], where the patient's sister lives.   The patient lives with her husband.  Her son lives in [place], [place].   One daughter lives in [place], the other in [place]. Patient does exercise.   Social Determinants of Health   Financial Resource Strain: Low Risk  (12/02/2022)   Overall Financial Resource Strain (CARDIA)    Difficulty of Paying Living Expenses: Not very hard  Food Insecurity: No Food Insecurity (12/02/2022)   Hunger Vital Sign    Worried About Running Out of Food in the Last Year: Never true    Ran Out of Food in the Last Year: Never true  Transportation Needs: No Transportation Needs (12/02/2022)   PRAPARE - Hydrologist (Medical): No    Lack of Transportation (Non-Medical): No  Physical Activity: Sufficiently Active (11/26/2021)   Exercise Vital Sign    Days of Exercise per Week: 7 days    Minutes of Exercise per Session: 30 min  Stress: No Stress Concern Present (11/26/2021)   [place]    Feeling of Stress :  Not at all  Social Connections: [place] (12/02/2022)   Social Connection and Isolation Panel [NHANES]    Frequency of Communication with Friends and Family: Twice a week    Frequency of Social Gatherings with Friends and Family: Once a week    Attends Religious Services: More than 4 times per year    Active Member of Clubs or Organizations: Yes    Attends  Music therapist: More than 4 times per year    Marital Status: Married    Tobacco Counseling Counseling given: Not Answered   Clinical Intake:  Pre-visit preparation completed: Yes  Diabetes: No  How often do you need to have someone help you when you read instructions, pamphlets, or other written materials from your doctor or pharmacy?: 1 - Never   Activities of Daily Living    12/02/2022    9:20 AM  In your present state of health, do you have any difficulty performing the following activities:  Hearing? 0  Vision? 0  Difficulty concentrating or making decisions? 0  Walking or climbing stairs? 0  Dressing or bathing? 0  Doing errands, shopping? 0  Preparing Food and eating ? N  Using the Toilet? N  In the past six months, have you accidently leaked urine? Y  Do you have problems with loss of bowel control? N  Managing your Medications? N  Managing your Finances? N  Housekeeping or managing your Housekeeping? N    Patient Care Team: Copland, Gay Filler, MD as PCP - General (Family Medicine) Amalia Greenhouse, MD (Endocrinology) Sharyne Peach, MD (Ophthalmology) Hurley Cisco, MD (Internal Medicine) Jovita Gamma, MD (Neurosurgery) Ladell Pier, MD (Internal Medicine)  Indicate any recent Medical Services you may have received from other than [facility] providers in the past year (date may be approximate).     Assessment:   This is a routine wellness examination for [place].  Hearing/Vision screen No results found.  Dietary issues and exercise activities discussed: Current Exercise  Habits: Home exercise routine, Type of exercise: walking;Other - see comments;treadmill;strength training/weights (stationary bike, eliptical), Time (Minutes): 30, Frequency (Times/Week): 4, Weekly Exercise (Minutes/Week): 120, Intensity: Mild, Exercise limited by: None identified   Goals Addressed   None    Depression Screen    12/02/2022    9:13 AM 12/30/2021    9:03 AM 11/26/2021    9:46 AM 07/01/2021   10:50 AM 06/17/2021    8:59 AM 11/13/2020    9:48 AM 11/12/2019    9:14 AM  PHQ 2/9 Scores  PHQ - 2 Score 0 0 0 0 0 0 1    Fall Risk    12/02/2022    9:06 AM 12/30/2021    9:03 AM 11/26/2021    9:44 AM 07/01/2021   10:50 AM 06/17/2021    9:00 AM  [place] in the past year? 0 0 0 1 1  Number falls in past yr: 0 0 0  0  Injury with Fall? 0 0 0  1  Risk for fall due to : No Fall Risks      Follow up Falls evaluation completed  Falls prevention discussed      FALL RISK PREVENTION PERTAINING TO THE HOME:  Any stairs in or around the home? Yes  If so, are there any without handrails? No  Home free of loose throw rugs in walkways, pet beds, electrical cords, etc? Yes  Adequate lighting in your home to reduce risk of falls? Yes   ASSISTIVE DEVICES UTILIZED TO PREVENT FALLS:  Life alert? No  Use of a cane, walker or w/c? No  Grab bars in the bathroom? No  Shower chair or bench in shower? Yes  Elevated toilet seat or a handicapped toilet? No   TIMED UP AND GO:  Was the test performed? Yes .  Length of time to ambulate 10 feet: 5 sec.   Gait steady and  fast without use of assistive device  Cognitive Function:        12/02/2022    9:24 AM  6CIT Screen  What Year? 0 points  What month? 0 points  What time? 0 points  Count back from 20 0 points  Months in reverse 0 points  Repeat phrase 0 points  Total Score 0 points    Immunizations Immunization History  Administered Date(s) Administered   Fluad Quad(high Dose 65+) 11/20/2019, 09/29/2022   Influenza Split  09/27/2022   Influenza Whole 11/06/2011   Influenza, High Dose Seasonal PF 12/13/2016, 12/14/2017, 10/25/2018   Influenza, Seasonal, Injecte, Preservative Fre 02/19/2013   Influenza,inj,Quad PF,6+ Mos 12/29/2013, 09/16/2014, 09/17/2015   Influenza-Unspecified 09/30/2020, 10/16/2021   PFIZER(Purple Top)SARS-COV-2 Vaccination 02/21/2020, 03/12/2020, 09/30/2020   Pfizer Covid-19 Vaccine Bivalent Booster 37yr & up 10/16/2021, 09/27/2022   Pneumococcal Conjugate-13 09/16/2014   Pneumococcal Polysaccharide-23 09/26/2004, 03/17/2015   Respiratory Syncytial Virus Vaccine,Recomb Aduvanted(Arexvy) 09/29/2022   Td 06/17/2021   Tdap 01/13/2011   Zoster Recombinat (Shingrix) 07/20/2021, 09/25/2021   Zoster, Live 03/17/2016    TDAP status: Up to date  Flu Vaccine status: Up to date  Pneumococcal vaccine status: Up to date  Covid-19 vaccine status: Information provided on how to obtain vaccines.   Qualifies for Shingles Vaccine? Yes   Zostavax completed Yes   Shingrix Completed?: Yes  Screening Tests Health Maintenance  Topic Date Due   COVID-19 Vaccine (6 - 2023-24 season) 11/22/2022   Medicare Annual Wellness (AWV)  11/26/2022   DTaP/Tdap/Td (3 - Td or Tdap) 06/18/2031   Pneumonia Vaccine 75 Years old  Completed   INFLUENZA VACCINE  Completed   DEXA SCAN  Completed   Hepatitis C Screening  Completed   Zoster Vaccines- Shingrix  Completed   HPV VACCINES  Aged Out   COLONOSCOPY (Pts [phone] coverage will need to be confirmed)  Discontinued    Health Maintenance  Health Maintenance Due  Topic Date Due   COVID-19 Vaccine (6 - 2023-24 season) 11/22/2022   Medicare Annual Wellness (AWV)  11/26/2022    Colorectal cancer screening: Type of screening: Colonoscopy. Completed 09/18/22. Repeat every N/a years  Mammogram status: Completed 06/17/22. Repeat every year  Bone Density status: Completed 06/11/21. Results reflect: Bone density results: OSTEOPENIA. Repeat every 2  years.  Lung Cancer Screening: (Low Dose CT Chest recommended if Age 75-80ears, 30 pack-year currently smoking OR have quit w/in 15years.) does not qualify.   Additional Screening:  Hepatitis C Screening: does qualify; Completed 09/17/15  Vision Screening: Recommended annual ophthalmology exams for early detection of glaucoma and other disorders of the eye. Is the patient up to date with their annual eye exam?  Yes  Who is the provider or what is the name of the office in which the patient attends annual eye exams? Dr. JaMelissa Noonf pt is not established with a provider, would they like to be referred to a provider to establish care? No .   Dental Screening: Recommended annual dental exams for proper oral hygiene  Community Resource Referral / Chronic Care Management: CRR required this visit?  No   CCM required this visit?  No      Plan:     I have personally reviewed and noted the following in the patient's chart:   Medical and social history Use of alcohol, tobacco or illicit drugs  Current medications and supplements including opioid prescriptions. Patient is not currently taking opioid prescriptions. Functional ability and status Nutritional status Physical activity  Advanced directives List of other physicians Hospitalizations, surgeries, and ER visits in previous 12 months Vitals Screenings to include cognitive, depression, and falls Referrals and appointments  In addition, I have reviewed and discussed with patient certain preventive protocols, quality metrics, and best practice recommendations. A written personalized care plan for preventive services as well as general preventive health recommendations were provided to patient.     Beatris Ship, [place]   12/02/2022   Nurse Notes: None

## 2022-12-30 DIAGNOSIS — H2513 Age-related nuclear cataract, bilateral: Secondary | ICD-10-CM | POA: Diagnosis not present

## 2022-12-30 DIAGNOSIS — H5202 Hypermetropia, left eye: Secondary | ICD-10-CM | POA: Diagnosis not present

## 2022-12-30 DIAGNOSIS — H52203 Unspecified astigmatism, bilateral: Secondary | ICD-10-CM | POA: Diagnosis not present

## 2022-12-30 DIAGNOSIS — E119 Type 2 diabetes mellitus without complications: Secondary | ICD-10-CM | POA: Diagnosis not present

## 2022-12-30 LAB — HM DIABETES EYE EXAM

## 2023-01-14 NOTE — Progress Notes (Unsigned)
[place] at [facility] [address] [place], [place] [address] [phone]  Date:  01/19/2023   Name:  Kristina Mann   DOB:  03-10-1946   MRN:  [id number]  PCP:  Darreld Mclean, MD    Chief Complaint: right shoulder pain   History of Present Illness:  Kristina Mann is a 77 y.o. very pleasant female patient who presents with the following:  Pt seen today for 6 month recheck Last seen by myself in July History of prediabetes, hypertension, sarcoidosis, osteopenia, subclinical hyperthyroidism, chronic left ankle swelling due to fracture 2009.  She did have her ankle replaced 2/23 at [facility] She is being seen next month for a recheck- she is pleased with her results  She does use advair for low level asthma and does well  She has noted some right shoulder pain- it developed when she was using a walker after her surgery last year.  Got better but then the pain did seem to return if she does certain movements It can ache at night if she lays on it  It is not excessively bothersome but she does notice it  She also notes her left ankle may itch sometimes over her surgical site.  No redness, heat or swelling The itching seems to come and go  Patient Active Problem List   Diagnosis Date Noted   Osteopenia 03/30/2017   Trigger finger, acquired 01/18/2017   Multinodular goiter 05/12/2016   Subclinical hyperthyroidism 05/12/2016   Pre-diabetes 06/16/2015   Low back pain 11/20/2013   Seasonal allergies 01/20/2012   Thyroid nodule 01/20/2012   Sarcoidosis 01/20/2012   HTN (hypertension) 01/20/2012   Basal cell cancer 01/20/2012    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   Sarcoidosis     Seasonal allergies    Thyroid dysfunction     NODULE ABN CELLS 2006    Past Surgical History:  Procedure Laterality Date   ABDOMINAL HYSTERECTOMY  1992   APPENDECTOMY  1978   CARPAL TUNNEL RELEASE Left 1979   CARPAL TUNNEL RELEASE Right 1980   COLONOSCOPY  11/08/2014   Dr.Brodie   GALLBLADDER SURGERY  1978   POLYPECTOMY     TIBIA FRACTURE SURGERY Left 2009   had 6 surgeries to repair and debride; and skin graft   TONSILLECTOMY AND ADENOIDECTOMY  1953   TOTAL ANKLE REPLACEMENT Left 02/12/2022   TUBAL LIGATION  1983   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  2011    Social History   Tobacco Use   Smoking status: Never   Smokeless tobacco: Never  Vaping Use   Vaping Use: Never used  Substance Use Topics   Alcohol use: Not Currently    Comment: occ wine   Drug use: No    Family History  Problem Relation Age of Onset   Diabetes Mother    Cancer Mother        BREAST, THYROID   Dementia Mother    Heart disease Father        MI X3   Cancer Father        lung   COPD Father    Hypertension Sister    Hypertension  Brother    Hypertension Son        mild    Colon cancer Neg Hx    Esophageal cancer Neg Hx    Stomach cancer Neg Hx     Allergies  Allergen Reactions   Hornet Venom Anaphylaxis   Codeine Nausea And Vomiting    Medication list has been reviewed and updated.  Current Outpatient Medications on File Prior to Visit  Medication Sig Dispense Refill   ASHWAGANDHA PO Take by mouth.     B Complex-C-Folic Acid (HM SUPER VITAMIN B COMPLEX/C PO) Take by mouth.     calcium-vitamin D 250-100 MG-UNIT per tablet Take 1 tablet by mouth 2 (two) times daily.     Ferrous Fumarate (FERROCITE PO) Take by mouth.     fluticasone-salmeterol (ADVAIR) 250-50 MCG/ACT AEPB Inhale 1 puff into the lungs in the morning and at bedtime. 60 each 5   MAGNESIUM GLYCINATE PO Take by mouth.     Multiple Vitamin (MULTIVITAMIN) capsule Take 1 capsule by mouth daily.     Nutritional Supplements (OSTEO  ADVANCE PO) Take by mouth.     Omega-3 Fatty Acids (OMEGA 3 PO) Take by mouth.     Probiotic Product (PROBIOTIC DAILY PO) Take by mouth.     RSV vaccine recomb adjuvanted (AREXVY) 120 MCG/0.5ML injection Inject into the muscle. 0.5 mL 0   Specialty Vitamins Products (RETAINE VISION PO) Take by mouth.     STRONTIUM GLUCONATE-B6-B12-FA PO Take by mouth.     Turmeric (QC TUMERIC COMPLEX PO) Take by mouth.     UNABLE TO FIND Med Name: Cholacol     No current facility-administered medications on file prior to visit.    Review of Systems:  As per HPI- otherwise negative.   Physical Examination: Vitals:   01/19/23 0849  BP: 134/80  Pulse: 73  SpO2: 94%   Vitals:   01/19/23 0849  Weight: 157 lb (71.2 kg)  Height: '5\' 1"'$  (1.549 m)   Body mass index is 29.66 kg/m. Ideal Body Weight: Weight in (lb) to have BMI = 25: 132  GEN: no acute distress. Overweight, looks well HEENT: Atraumatic, Normocephalic.  Ears and Nose: No external deformity. CV: RRR, No M/G/R. No JVD. No thrill. No extra heart sounds. PULM: CTA B, no wheezes, crackles, rhonchi. No retractions. No resp. distress. No accessory muscle use. ABD: S, NT, ND, +BS. No rebound. No HSM. EXTR: No c/c/e PSYCH: Normally interactive. Conversant.  Right shoulder - normal ROM but some discomfort with internal rotation and push off test.  Strength is good  Left ankle: s/p replacement and previous injury with chronic deformity.  No redness, heat or swelling.  No sign of infection or cellulitis at this time  Assessment and Plan: Pre-diabetes - Plan: Comprehensive metabolic panel, Hemoglobin A1c  Essential hypertension, benign - Plan: CBC, Comprehensive metabolic panel  Sarcoidosis  Screening, lipid - Plan: Lipid panel  Rotator cuff tendonitis, right  Patient seen today for follow-up.  Lab work is pending as above.  Blood pressure under good control today She does have likely rotator cuff tendinitis in her right shoulder.  Her  symptoms are fairly mild.  For the time being we decided to have her try home exercise program, I gave her handout with instructions.  If not making progress in a few weeks she would like to go back to the physical therapist who helped her after her ankle surgery at [place] further follow- up pending labs.  Signed Lamar Blinks, MD  Received her labs as below, message to patient  Results for orders placed or performed in visit on 01/19/23  CBC  Result Value Ref Range   WBC 5.7 4.0 - 10.5 K/uL   RBC 4.88 3.87 - 5.11 Mil/uL   Platelets 292.0 150.0 - 400.0 K/uL   Hemoglobin 14.7 12.0 - 15.0 g/dL   HCT 43.9 36.0 - 46.0 %   MCV 89.9 78.0 - 100.0 fl   MCHC 33.5 30.0 - 36.0 g/dL   RDW 14.2 11.5 - 15.5 %  Comprehensive metabolic panel  Result Value Ref Range   Sodium 141 135 - 145 mEq/L   Potassium 4.5 3.5 - 5.1 mEq/L   Chloride 105 96 - 112 mEq/L   CO2 28 19 - 32 mEq/L   Glucose, Bld 102 (H) 70 - 99 mg/dL   BUN 16 6 - 23 mg/dL   Creatinine, Ser 0.70 0.40 - 1.20 mg/dL   Total Bilirubin 0.3 0.2 - 1.2 mg/dL   Alkaline Phosphatase 86 39 - 117 U/L   AST 18 0 - 37 U/L   ALT 17 0 - 35 U/L   Total Protein 6.5 6.0 - 8.3 g/dL   Albumin 4.2 3.5 - 5.2 g/dL   GFR 84.77 >60.00 mL/min   Calcium 9.1 8.4 - 10.5 mg/dL  Hemoglobin A1c  Result Value Ref Range   Hgb A1c MFr Bld 5.9 4.6 - 6.5 %  Lipid panel  Result Value Ref Range   Cholesterol 196 0 - 200 mg/dL   Triglycerides 52.0 0.0 - 149.0 mg/dL   HDL 82.70 >39.00 mg/dL   VLDL 10.4 0.0 - 40.0 mg/dL   LDL Cholesterol 103 (H) 0 - 99 mg/dL   Total CHOL/HDL Ratio 2    NonHDL 113.56

## 2023-01-19 ENCOUNTER — Encounter: Payer: Self-pay | Admitting: Family Medicine

## 2023-01-19 ENCOUNTER — Ambulatory Visit (INDEPENDENT_AMBULATORY_CARE_PROVIDER_SITE_OTHER): Payer: Medicare Other | Admitting: Family Medicine

## 2023-01-19 VITALS — BP 134/80 | HR 73 | Ht 61.0 in | Wt 157.0 lb

## 2023-01-19 DIAGNOSIS — I10 Essential (primary) hypertension: Secondary | ICD-10-CM

## 2023-01-19 DIAGNOSIS — M7581 Other shoulder lesions, right shoulder: Secondary | ICD-10-CM | POA: Diagnosis not present

## 2023-01-19 DIAGNOSIS — D869 Sarcoidosis, unspecified: Secondary | ICD-10-CM | POA: Diagnosis not present

## 2023-01-19 DIAGNOSIS — R7303 Prediabetes: Secondary | ICD-10-CM

## 2023-01-19 DIAGNOSIS — Z13220 Encounter for screening for lipoid disorders: Secondary | ICD-10-CM

## 2023-01-19 LAB — LIPID PANEL
Cholesterol: 196 mg/dL (ref 0–200)
HDL: 82.7 mg/dL (ref >39.00)
LDL Cholesterol: 103 mg/dL — ABNORMAL HIGH (ref 0–99)
NonHDL: 113.56
Total CHOL/HDL Ratio: 2
Triglycerides: 52 mg/dL (ref 0.0–149.0)
VLDL: 10.4 mg/dL (ref 0.0–40.0)

## 2023-01-19 LAB — CBC
HCT: 43.9 % (ref 36.0–46.0)
Hemoglobin: 14.7 g/dL (ref 12.0–15.0)
MCHC: 33.5 g/dL (ref 30.0–36.0)
MCV: 89.9 fl (ref 78.0–100.0)
Platelets: 292 10*3/uL (ref 150.0–400.0)
RBC: 4.88 Mil/uL (ref 3.87–5.11)
RDW: 14.2 % (ref 11.5–15.5)
WBC: 5.7 10*3/uL (ref 4.0–10.5)

## 2023-01-19 LAB — COMPREHENSIVE METABOLIC PANEL
ALT: 17 U/L (ref 0–35)
AST: 18 U/L (ref 0–37)
Albumin: 4.2 g/dL (ref 3.5–5.2)
Alkaline Phosphatase: 86 U/L (ref 39–117)
BUN: 16 mg/dL (ref 6–23)
CO2: 28 mEq/L (ref 19–32)
Calcium: 9.1 mg/dL (ref 8.4–10.5)
Chloride: 105 mEq/L (ref 96–112)
Creatinine, Ser: 0.7 mg/dL (ref 0.40–1.20)
GFR: 84.77 mL/min (ref >60.00)
Glucose, Bld: 102 mg/dL — ABNORMAL HIGH (ref 70–99)
Potassium: 4.5 mEq/L (ref 3.5–5.1)
Sodium: 141 mEq/L (ref 135–145)
Total Bilirubin: 0.3 mg/dL (ref 0.2–1.2)
Total Protein: 6.5 g/dL (ref 6.0–8.3)

## 2023-01-19 LAB — HEMOGLOBIN A1C: Hgb A1c MFr Bld: 5.9 % (ref 4.6–6.5)

## 2023-01-19 MED ORDER — FLUTICASONE-SALMETEROL 250-50 MCG/ACT IN AEPB: 1.0000 | INHALATION_SPRAY | Freq: Two times a day (BID) | RESPIRATORY_TRACT | 5 refills | Status: DC

## 2023-01-19 NOTE — Patient Instructions (Signed)
It was good to see you again today- I will be in touch with your labs asap Try some of the shoulder exercises perhaps 3-4x a week for 14- 20 minutes per session If not improving in 2-3 weeks please let me know and we can get you in with [facility] PT

## 2023-01-26 ENCOUNTER — Encounter: Payer: Self-pay | Admitting: Family Medicine

## 2023-01-26 ENCOUNTER — Ambulatory Visit (HOSPITAL_BASED_OUTPATIENT_CLINIC_OR_DEPARTMENT_OTHER)
Admission: RE | Admit: 2023-01-26 | Discharge: 2023-01-26 | Disposition: A | Discharge status: Home / Self Care | Payer: Medicare Other | Source: Ambulatory Visit | Attending: Family Medicine | Admitting: Family Medicine

## 2023-01-26 DIAGNOSIS — I10 Essential (primary) hypertension: Secondary | ICD-10-CM

## 2023-01-27 ENCOUNTER — Encounter: Payer: Self-pay | Admitting: Family Medicine

## 2023-01-27 DIAGNOSIS — J398 Other specified diseases of upper respiratory tract: Secondary | ICD-10-CM

## 2023-02-02 ENCOUNTER — Ambulatory Visit (HOSPITAL_BASED_OUTPATIENT_CLINIC_OR_DEPARTMENT_OTHER)
Admission: RE | Admit: 2023-02-02 | Discharge: 2023-02-02 | Disposition: A | Discharge status: Home / Self Care | Payer: Medicare Other | Source: Ambulatory Visit | Attending: Family Medicine | Admitting: Family Medicine

## 2023-02-02 DIAGNOSIS — J398 Other specified diseases of upper respiratory tract: Secondary | ICD-10-CM | POA: Insufficient documentation

## 2023-02-02 DIAGNOSIS — R918 Other nonspecific abnormal finding of lung field: Secondary | ICD-10-CM | POA: Diagnosis not present

## 2023-02-02 MED ORDER — IOHEXOL 300 MG/ML  SOLN
75.0000 mL | Freq: Once | INTRAMUSCULAR | Status: AC | PRN
  Administered 2023-02-02: 75 mL via INTRAVENOUS

## 2023-02-06 ENCOUNTER — Encounter: Payer: Self-pay | Admitting: Family Medicine

## 2023-02-16 ENCOUNTER — Ambulatory Visit (INDEPENDENT_AMBULATORY_CARE_PROVIDER_SITE_OTHER): Payer: Medicare Other | Admitting: Medical

## 2023-02-16 VITALS — BP 121/87 | HR 75 | Temp 98.2°F | Resp 16 | Ht 60.0 in | Wt 161.0 lb

## 2023-02-16 DIAGNOSIS — R058 Other specified cough: Secondary | ICD-10-CM

## 2023-02-16 DIAGNOSIS — J069 Acute upper respiratory infection, unspecified: Secondary | ICD-10-CM

## 2023-02-16 MED ORDER — FLUTICASONE PROPIONATE 50 MCG/ACT NA SUSP: 2.0000 | Freq: Every day | NASAL | 1 refills | Status: DC

## 2023-02-16 NOTE — Progress Notes (Signed)
   Subjective:    Patient ID: Kristina Mann, female    DOB: 1946-01-29, 77 y.o.   MRN: [id number]  HPI Pt in for cough, chest congestion, subjective fever, fatigue and nasal congestion. Signs/symptoms for 2 days.  No myalgia or arthralgias.  Pt states mild seasonal allergies.   Pt home covid test negative yesterday and today.     Review of Systems  Constitutional:  Negative for chills, fatigue and fever.  HENT:  Positive for congestion. Negative for ear pain, hearing loss, rhinorrhea and sinus pain.   Respiratory:  Negative for cough, chest tightness, shortness of breath and wheezing.        Objective:   Physical Exam        Assessment & Plan:   Patient Instructions  Upper respiratory infection type signs/symptoms. 2 covid test negative and your flu test is negative. On exam signs of bacterial infection found.  Recommend flonase nasal spray for congestion and benzonatate for cough.  If you signs/symptoms change or worsen let me know. If ear pain, sinus pain or productive cough before weekend could prescribe antibiotic.  Follow up 7-10 days or sooner if needed.

## 2023-02-16 NOTE — Patient Instructions (Addendum)
Upper respiratory infection type signs/symptoms. 2 covid test negative and your flu test is negative. On exam signs of bacterial infection found.  Recommend flonase nasal spray for congestion and benzonatate for cough.  If you signs/symptoms change or worsen let me know. If ear pain, sinus pain or productive cough before weekend could prescribe antibiotic.  Follow up 7-10 days or sooner if needed.

## 2023-02-17 ENCOUNTER — Encounter: Payer: Self-pay | Admitting: Medical

## 2023-02-17 DIAGNOSIS — R051 Acute cough: Secondary | ICD-10-CM

## 2023-02-18 MED ORDER — AZITHROMYCIN 250 MG PO TABS: ORAL_TABLET | ORAL | 0 refills | Status: AC

## 2023-02-18 NOTE — Addendum Note (Signed)
Addended by: Anabel Halon on: 02/18/2023 03:36 PM   Modules accepted: Orders

## 2023-02-18 NOTE — Telephone Encounter (Signed)
Patient called for an update. Let her know we were waiting of Percell Miller but it was sent back to him

## 2023-02-22 MED ORDER — PREDNISONE 20 MG PO TABS: ORAL_TABLET | ORAL | 0 refills | Status: DC

## 2023-02-22 NOTE — Telephone Encounter (Signed)
Called pt back- she started on azithromycin on Friday (today is Tuesday, she finished the course today) but she is no better She has doubled up on her advair  inhaler  She has minimally elevated temp to about 98.7- her normal is about 97.5 She has a productive cough  Her sats have been 95% at home  She is not in distress and does not feel like she needs the ER   Will start her on pred and get an outpt CXR in the am- for now she chooses this over being seen in the office She will seek immediate care if not doing ok overnight   Lab Results  Component Value Date   HGBA1C 5.9 01/19/2023

## 2023-02-23 ENCOUNTER — Ambulatory Visit (HOSPITAL_BASED_OUTPATIENT_CLINIC_OR_DEPARTMENT_OTHER)
Admission: RE | Admit: 2023-02-23 | Discharge: 2023-02-23 | Disposition: A | Discharge status: Home / Self Care | Payer: Medicare Other | Source: Ambulatory Visit | Attending: Family Medicine | Admitting: Family Medicine

## 2023-02-23 ENCOUNTER — Encounter: Payer: Self-pay | Admitting: Family Medicine

## 2023-02-23 DIAGNOSIS — R051 Acute cough: Secondary | ICD-10-CM | POA: Insufficient documentation

## 2023-02-23 DIAGNOSIS — J9811 Atelectasis: Secondary | ICD-10-CM | POA: Diagnosis not present

## 2023-02-23 DIAGNOSIS — R059 Cough, unspecified: Secondary | ICD-10-CM | POA: Diagnosis not present

## 2023-02-23 DIAGNOSIS — R509 Fever, unspecified: Secondary | ICD-10-CM | POA: Diagnosis not present

## 2023-02-25 NOTE — Telephone Encounter (Signed)
Do you have a preferred day/ time?

## 2023-02-25 NOTE — Telephone Encounter (Signed)
Pt was seen by Percell Miller and Dx with URI- he was Rx flonase nasal spray for congestion and benzonatate for cough.

## 2023-02-27 NOTE — Patient Instructions (Incomplete)
It was good to see you again, I hope you are feeling well very soon Start on doxycycline If you are not continuing to make progress let me know. We can order a D dimer to rule out a pulmonary embolism if we need to

## 2023-02-27 NOTE — Progress Notes (Unsigned)
[place] at [facility] [address], [place], [place] [address] [phone]  Date:  02/28/2023   Name:  Kristina Mann   DOB:  1946/09/26   MRN:  [id number]  PCP:  Kristina Mclean, MD    Chief Complaint: URI (Seen on 02/16/23 by ES- pt not feeling any better. Pt says she still has low energy, runny nose, cough, and has had a sore throat 4-5 days first thing in the AM. She has been taking Tessalon 200 mg, finished Prednisone this AM.  She doesn't feel like she is breathing as deeply as she should be. )   History of Present Illness:  Kristina Mann is a 77 y.o. very pleasant female patient who presents with the following:  Patient seen today for follow-up of acute illness History of prediabetes, hypertension, sarcoidosis, osteopenia, subclinical hyperthyroidism, chronic left ankle swelling due to fracture 2009.  She did have her ankle replaced 2/23 at [place] seen by myself on January 24 for routine visit  She came back and was seen by my partner Kristina Mann on February 21 with URI symptoms Her symptoms persisted, so when she called back a couple days later he prescribed azithromycin  She then called in on February 27 with concern of still feeling poorly.  I had a chest x-ray, started on prednisone Chest x-ray was negative and patient seemed to be improving-but then symptoms lingered so we made this appointment She finished her prednisone this am She does feel like it helped at least some  She is using flonase once a day Abx done- she took zapck She has used delxym and tessalon Her cough can be productive She noted some green mucus from her nose just once  Her ears are congested and crackling  No fever noted sine early in this illness   Pt notes her glucose was 88 this am   Lab Results  Component Value Date   HGBA1C 5.9 01/19/2023    Patient Active Problem List   Diagnosis Date Noted   Osteopenia 03/30/2017    Trigger finger, acquired 01/18/2017   Multinodular goiter 05/12/2016   Subclinical hyperthyroidism 05/12/2016   Pre-diabetes 06/16/2015   Low back pain 11/20/2013   Seasonal allergies 01/20/2012   Thyroid nodule 01/20/2012   Sarcoidosis 01/20/2012   HTN (hypertension) 01/20/2012   Basal cell cancer 01/20/2012    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   Sarcoidosis    Seasonal allergies    Thyroid dysfunction     NODULE ABN CELLS 2006    Past Surgical History:  Procedure Laterality Date   ABDOMINAL HYSTERECTOMY  1992   APPENDECTOMY  1978   CARPAL TUNNEL RELEASE Left 1979   CARPAL TUNNEL RELEASE Right 1980   COLONOSCOPY  11/08/2014   [facility]   POLYPECTOMY     TIBIA FRACTURE SURGERY Left 2009   had 6 surgeries to repair and debride; and skin graft   TONSILLECTOMY AND [place] REPLACEMENT Left 02/12/2022   TUBAL LIGATION  1983   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  2011    Social History  Tobacco Use   Smoking status: Never   Smokeless tobacco: Never  Vaping Use   Vaping Use: Never used  Substance Use Topics   Alcohol use: Not Currently    Comment: occ wine   Drug use: No    Family History  Problem Relation Age of Onset   Diabetes Mother    Cancer Mother        BREAST, THYROID   Dementia Mother    Heart disease Father        MI X3   Cancer Father        lung   COPD Father    Hypertension Sister    Hypertension Brother    Hypertension Son        mild    Colon cancer Neg Hx    Esophageal cancer Neg Hx    Stomach cancer Neg Hx     Allergies  Allergen Reactions   Hornet Venom Anaphylaxis   Codeine Nausea And Vomiting    Medication list has been reviewed and  updated.  Current Outpatient Medications on File Prior to Visit  Medication Sig Dispense Refill   ASHWAGANDHA PO Take by mouth.     B Complex-C-Folic Acid (HM SUPER VITAMIN B COMPLEX/C PO) Take by mouth.     calcium-vitamin D 250-100 MG-UNIT per tablet Take 1 tablet by mouth 2 (two) times daily.     Ferrous Fumarate (FERROCITE PO) Take by mouth.     fluticasone (FLONASE) 50 MCG/ACT nasal spray Place 2 sprays into both nostrils daily. 16 g 1   fluticasone-salmeterol (ADVAIR) 250-50 MCG/ACT AEPB Inhale 1 puff into the lungs in the morning and at bedtime. 60 each 5   MAGNESIUM GLYCINATE PO Take by mouth.     Multiple Vitamin (MULTIVITAMIN) capsule Take 1 capsule by mouth daily.     Nutritional Supplements (OSTEO ADVANCE PO) Take by mouth.     Omega-3 Fatty Acids (OMEGA 3 PO) Take by mouth.     Probiotic Product (PROBIOTIC DAILY PO) Take by mouth.     Specialty Vitamins Products (RETAINE VISION PO) Take by mouth.     STRONTIUM GLUCONATE-B6-B12-FA PO Take by mouth.     Turmeric (QC TUMERIC COMPLEX PO) Take by mouth.     UNABLE TO FIND Med Name: Cholacol     No current facility-administered medications on file prior to visit.    Review of Systems:  As per HPI- otherwise negative.     Physical Examination: Vitals:   02/28/23 1429  BP: 126/70  Pulse: 82  Resp: 18  Temp: 97.6 F (36.4 C)  SpO2: 95%   Vitals:   02/28/23 1429  Weight: 156 lb 6.4 oz (70.9 kg)  Height: 5' (1.524 m)   Body mass index is 30.54 kg/m. Ideal Body Weight: Weight in (lb) to have BMI = 25: 127.7  GEN: no acute distress.  Mildly obese, looks well HEENT: Atraumatic, Normocephalic.  Ears and Nose: No external deformity. CV: RRR, No M/G/R. No JVD. No thrill. No extra heart sounds. PULM: There are some crackles auscultated bilaterally, had patient cough and crackles resolved.  No wheezing at this time, no retractions. No resp. distress. No accessory muscle use. ABD: S, NT, ND, +BS. No rebound. No  HSM. EXTR: No c/c/e PSYCH: Normally interactive. Conversant.    Assessment and Plan: Acute bronchitis with COPD ([place]) - Plan: doxycycline (VIBRA-TABS) 100 MG tablet Patient seen today for follow-up, as above she has been coughing and dealing with chest congestion for 2  and half to 3 weeks.  She has been treated with azithromycin and steroids, symptoms are better but still not gone.  However, she denies any particular shortness of breath.  Chest x-ray performed recently was clear  Will have her start on doxycycline for 10 days.  We discussed possibility of pulmonary embolism, I offered to do a D-dimer today.  However, explained that a positive D-dimer will be to do another CT scan-there is a definite possibility of false positive D-dimer.  For the time being she prefers to defer D-dimer testing and see how she responds with doxycycline.  If not doing better within 48 hours we can order a D-dimer for her  Signed Lamar Blinks, MD

## 2023-02-28 ENCOUNTER — Ambulatory Visit (INDEPENDENT_AMBULATORY_CARE_PROVIDER_SITE_OTHER): Payer: Medicare Other | Admitting: Family Medicine

## 2023-02-28 VITALS — BP 126/70 | HR 82 | Temp 97.6°F | Resp 18 | Ht 60.0 in | Wt 156.4 lb

## 2023-02-28 DIAGNOSIS — J209 Acute bronchitis, unspecified: Secondary | ICD-10-CM

## 2023-02-28 DIAGNOSIS — J440 Chronic obstructive pulmonary disease with acute lower respiratory infection: Secondary | ICD-10-CM

## 2023-02-28 MED ORDER — DOXYCYCLINE HYCLATE 100 MG PO TABS: 100.0000 mg | ORAL_TABLET | Freq: Two times a day (BID) | ORAL | 0 refills | Status: DC

## 2023-03-02 ENCOUNTER — Encounter: Payer: Self-pay | Admitting: Family Medicine

## 2023-03-10 DIAGNOSIS — M7989 Other specified soft tissue disorders: Secondary | ICD-10-CM | POA: Diagnosis not present

## 2023-03-10 DIAGNOSIS — Z96662 Presence of left artificial ankle joint: Secondary | ICD-10-CM | POA: Diagnosis not present

## 2023-03-10 DIAGNOSIS — M19172 Post-traumatic osteoarthritis, left ankle and foot: Secondary | ICD-10-CM | POA: Diagnosis not present

## 2023-04-11 ENCOUNTER — Ambulatory Visit
Admission: RE | Admit: 2023-04-11 | Discharge: 2023-04-11 | Disposition: A | Discharge status: Home / Self Care | Payer: Medicare Other | Source: Ambulatory Visit | Attending: Family Medicine | Admitting: Family Medicine

## 2023-04-11 DIAGNOSIS — K769 Liver disease, unspecified: Secondary | ICD-10-CM | POA: Diagnosis not present

## 2023-04-11 MED ORDER — GADOPICLENOL 0.5 MMOL/ML IV SOLN
7.0000 mL | Freq: Once | INTRAVENOUS | Status: AC | PRN
  Administered 2023-04-11: 7 mL via INTRAVENOUS

## 2023-04-12 ENCOUNTER — Encounter: Payer: Self-pay | Admitting: Family Medicine

## 2023-06-23 DIAGNOSIS — Z1231 Encounter for screening mammogram for malignant neoplasm of breast: Secondary | ICD-10-CM | POA: Diagnosis not present

## 2023-06-23 LAB — HM MAMMOGRAPHY

## 2023-06-24 ENCOUNTER — Encounter: Payer: Self-pay | Admitting: Family Medicine

## 2023-07-14 NOTE — Progress Notes (Unsigned)
[facility] at [facility] [address], [address] [place], [place] [address] [phone]  Date:  07/20/2023   Name:  Kristina Mann   DOB:  08-30-1946   MRN:  [id number]  PCP:  Pearline Cables, MD    Chief Complaint: No chief complaint on file.   History of Present Illness:  Kristina Mann is a 77 y.o. very pleasant female patient who presents with the following:  Patient seen today for periodic follow-up Most recent visit with myself was in March for sick visit History of prediabetes, hypertension, sarcoidosis, osteopenia, subclinical hyperthyroidism, chronic left ankle swelling due to fracture 2009.  She did have her ankle replaced 2/23 at [facility]  She is seen by endocrinology for her subclinical hypothyroidism-Dr. Katrinka Blazing with [facility] It looks like her most recent visit was in November 2023: Multinodular goiter - will repeat thyroid US due to symptoms Subclinical hyperthyroidism- will check thyroid function, she does not have central hypothyroidism- her free hormone levels have always been normal from 2016-2021 It would be completely rare for a car accident or head injury to only damage the thyrotroph cells in the pituitary - there would be some other abnormality  Hypertension- BP at goal, continue current regimen She will follow up as needed   Can update bone density Mammogram up-to-date Colonoscopy completed 2023 Lab work in [place], lipid, CBC, A1c Lab Results  Component Value Date   HGBA1C 5.9 01/19/2023    Patient Active Problem List   Diagnosis Date Noted   Osteopenia 03/30/2017   Trigger finger, acquired 01/18/2017   Multinodular goiter 05/12/2016   Subclinical hyperthyroidism 05/12/2016   Pre-diabetes 06/16/2015   Low back pain 11/20/2013   Seasonal allergies 01/20/2012   Thyroid nodule 01/20/2012   Sarcoidosis 01/20/2012   HTN (hypertension) 01/20/2012   Basal cell cancer 01/20/2012    Past Medical History:   Diagnosis Date   Basal cell cancer 06/2005   Patient denies any cancer   Blood transfusion without reported diagnosis    Bronchitis    Chicken pox    GERD (gastroesophageal reflux disease)    Goiter    Hypertension    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   Sarcoidosis    Seasonal allergies    Thyroid dysfunction     NODULE ABN CELLS 2006    Past Surgical History:  Procedure Laterality Date   ABDOMINAL HYSTERECTOMY  1992   APPENDECTOMY  1978   CARPAL TUNNEL RELEASE Left 1979   CARPAL TUNNEL RELEASE Right 1980   COLONOSCOPY  11/08/2014   Dr.Brodie   GALLBLADDER SURGERY  1978   POLYPECTOMY     TIBIA FRACTURE SURGERY Left 2009   had 6 surgeries to repair and debride; and skin graft   TONSILLECTOMY AND ADENOIDECTOMY  1953   TOTAL ANKLE REPLACEMENT Left 02/12/2022   TUBAL LIGATION  1983   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  2011    Social History   Tobacco Use   Smoking status: Never   Smokeless tobacco: Never  Vaping Use   Vaping status: Never Used  Substance Use Topics   Alcohol use: Not Currently    Comment: occ wine   Drug use: No    Family History  Problem Relation Age of Onset   Diabetes Mother    Cancer Mother        BREAST, THYROID   Dementia Mother    Heart disease Father        MI X3  Cancer Father        lung   COPD Father    Hypertension Sister    Hypertension Brother    Hypertension Son        mild    Colon cancer Neg Hx    Esophageal cancer Neg Hx    Stomach cancer Neg Hx     Allergies  Allergen Reactions   Hornet Venom Anaphylaxis   Codeine Nausea And Vomiting    Medication list has been reviewed and updated.  Current Outpatient Medications on File Prior to Visit  Medication Sig Dispense Refill   ASHWAGANDHA PO Take by mouth.     B Complex-C-Folic Acid (HM SUPER VITAMIN B COMPLEX/C PO) Take by mouth.     calcium-vitamin D 250-100 MG-UNIT per tablet Take 1 tablet by mouth 2 (two) times daily.     doxycycline (VIBRA-TABS) 100 MG tablet Take 1  tablet (100 mg total) by mouth 2 (two) times daily. 20 tablet 0   Ferrous Fumarate (FERROCITE PO) Take by mouth.     fluticasone (FLONASE) 50 MCG/ACT nasal spray Place 2 sprays into both nostrils daily. 16 g 1   fluticasone-salmeterol (ADVAIR) 250-50 MCG/ACT AEPB Inhale 1 puff into the lungs in the morning and at bedtime. 60 each 5   MAGNESIUM GLYCINATE PO Take by mouth.     Multiple Vitamin (MULTIVITAMIN) capsule Take 1 capsule by mouth daily.     Nutritional Supplements (OSTEO ADVANCE PO) Take by mouth.     Omega-3 Fatty Acids (OMEGA 3 PO) Take by mouth.     Probiotic Product (PROBIOTIC DAILY PO) Take by mouth.     Specialty Vitamins Products (RETAINE VISION PO) Take by mouth.     STRONTIUM GLUCONATE-B6-B12-FA PO Take by mouth.     Turmeric (QC TUMERIC COMPLEX PO) Take by mouth.     UNABLE TO FIND Med Name: Cholacol     No current facility-administered medications on file prior to visit.    Review of Systems:  As per HPI- otherwise negative.   Physical Examination: There were no vitals filed for this visit. There were no vitals filed for this visit. There is no height or weight on file to calculate BMI. Ideal Body Weight:    GEN: no acute distress. HEENT: Atraumatic, Normocephalic.  Ears and Nose: No external deformity. CV: RRR, No M/G/R. No JVD. No thrill. No extra heart sounds. PULM: CTA B, no wheezes, crackles, rhonchi. No retractions. No resp. distress. No accessory muscle use. ABD: S, NT, ND, +BS. No rebound. No HSM. EXTR: No c/c/e PSYCH: Normally interactive. Conversant.    Assessment and Plan: ***  Signed Abbe Amsterdam, MD

## 2023-07-14 NOTE — Patient Instructions (Incomplete)
It was great to see again today, assuming all is well please see me in about 6 months  Be safe!  Please give Brett Canales a big hug from me

## 2023-07-20 ENCOUNTER — Ambulatory Visit (INDEPENDENT_AMBULATORY_CARE_PROVIDER_SITE_OTHER): Payer: Medicare Other | Admitting: Family Medicine

## 2023-07-20 ENCOUNTER — Encounter: Payer: Self-pay | Admitting: Family Medicine

## 2023-07-20 VITALS — BP 110/70 | HR 65 | Temp 97.6°F | Resp 18 | Ht 60.0 in | Wt 156.6 lb

## 2023-07-20 DIAGNOSIS — E0590 Thyrotoxicosis, unspecified without thyrotoxic crisis or storm: Secondary | ICD-10-CM

## 2023-07-20 DIAGNOSIS — I10 Essential (primary) hypertension: Secondary | ICD-10-CM

## 2023-07-20 DIAGNOSIS — D869 Sarcoidosis, unspecified: Secondary | ICD-10-CM

## 2023-07-20 DIAGNOSIS — R7303 Prediabetes: Secondary | ICD-10-CM | POA: Diagnosis not present

## 2023-07-20 LAB — BASIC METABOLIC PANEL
BUN: 14 mg/dL (ref 6–23)
CO2: 27 mEq/L (ref 19–32)
Calcium: 9.5 mg/dL (ref 8.4–10.5)
Chloride: 105 mEq/L (ref 96–112)
Creatinine, Ser: 0.74 mg/dL (ref 0.40–1.20)
GFR: 79.03 mL/min (ref >60.00)
Glucose, Bld: 94 mg/dL (ref 70–99)
Potassium: 4.4 mEq/L (ref 3.5–5.1)
Sodium: 141 mEq/L (ref 135–145)

## 2023-07-20 LAB — CBC
HCT: 45.6 % (ref 36.0–46.0)
Hemoglobin: 14.9 g/dL (ref 12.0–15.0)
MCHC: 32.5 g/dL (ref 30.0–36.0)
MCV: 91.1 fl (ref 78.0–100.0)
Platelets: 307 10*3/uL (ref 150.0–400.0)
RBC: 5.01 Mil/uL (ref 3.87–5.11)
RDW: 13.8 % (ref 11.5–15.5)
WBC: 5.1 10*3/uL (ref 4.0–10.5)

## 2023-07-20 LAB — TSH: TSH: 0.34 u[IU]/mL — ABNORMAL LOW (ref 0.35–5.50)

## 2023-07-20 LAB — HEMOGLOBIN A1C: Hgb A1c MFr Bld: 5.7 % (ref 4.6–6.5)

## 2023-07-20 MED ORDER — FLUTICASONE-SALMETEROL 250-50 MCG/ACT IN AEPB: 1.0000 | INHALATION_SPRAY | Freq: Two times a day (BID) | RESPIRATORY_TRACT | 9 refills | Status: DC

## 2023-07-20 NOTE — Addendum Note (Signed)
Addended by: Abbe Amsterdam C on: 07/20/2023 08:29 PM   Modules accepted: Orders

## 2023-08-04 ENCOUNTER — Encounter: Payer: Self-pay | Admitting: Family Medicine

## 2023-08-24 ENCOUNTER — Encounter: Payer: Self-pay | Admitting: Family Medicine

## 2023-08-24 ENCOUNTER — Other Ambulatory Visit (INDEPENDENT_AMBULATORY_CARE_PROVIDER_SITE_OTHER): Payer: Medicare Other

## 2023-08-24 DIAGNOSIS — E0590 Thyrotoxicosis, unspecified without thyrotoxic crisis or storm: Secondary | ICD-10-CM | POA: Diagnosis not present

## 2023-08-24 LAB — TSH: TSH: 0.88 u[IU]/mL (ref 0.35–5.50)

## 2023-09-15 ENCOUNTER — Encounter: Payer: Self-pay | Admitting: Family Medicine

## 2023-09-15 NOTE — Telephone Encounter (Signed)
Can we administer this here for the pt? Looks like she has Medicare- but secondary maybe?

## 2023-09-22 DIAGNOSIS — Z23 Encounter for immunization: Secondary | ICD-10-CM | POA: Diagnosis not present

## 2023-10-03 ENCOUNTER — Encounter: Payer: Self-pay | Admitting: Family Medicine

## 2023-11-28 ENCOUNTER — Encounter: Payer: Self-pay | Admitting: *Deleted

## 2023-12-07 ENCOUNTER — Ambulatory Visit: Payer: Medicare Other | Admitting: *Deleted

## 2023-12-07 DIAGNOSIS — Z0000 Encounter for general adult medical examination without abnormal findings: Secondary | ICD-10-CM | POA: Diagnosis not present

## 2023-12-07 NOTE — Progress Notes (Signed)
Subjective:   Kristina Mann is a 77 y.o. female who presents for Medicare Annual (Subsequent) preventive examination.  Visit Complete: Virtual I connected with  Kristina Mann on 12/07/23 by a audio enabled telemedicine application and verified that I am speaking with the correct person using two identifiers.  Patient Location: Home  Provider Location: Office/Clinic  I discussed the limitations of evaluation and management by telemedicine. The patient expressed understanding and agreed to proceed.  Vital Signs: Because this visit was a virtual/telehealth visit, some criteria may be missing or patient reported. Any vitals not documented were not able to be obtained and vitals that have been documented are patient reported.  Patient Medicare AWV questionnaire was completed by the patient on 11/30/23; I have confirmed that all information answered by patient is correct and no changes since this date.  Cardiac Risk Factors include: advanced age (>66men, >52 women);hypertension     Objective:    There were no vitals filed for this visit. There is no height or weight on file to calculate BMI.     12/07/2023    1:39 PM 12/02/2022    9:05 AM 11/26/2021    9:43 AM 11/13/2020    9:46 AM 10/30/2020   12:42 PM 11/12/2019    9:09 AM 11/07/2018   10:19 AM  Advanced Directives  Does Patient Have a Medical Advance Directive? No;Yes Yes Yes Yes Yes Yes No  Type of Estate agent of [place];Living will Healthcare Power of [place];Living will Healthcare Power of [place];Living will Healthcare Power of [place];Living will Healthcare Power of [place];Living will Healthcare Power of [place];Living will   Does patient want to make changes to medical advance directive? No - Patient declined No - Patient declined   No - Patient declined No - Patient declined   Copy of Healthcare Power of Attorney in Chart? Yes - validated most recent copy scanned in chart (See row information) Yes  - validated most recent copy scanned in chart (See row information) Yes - validated most recent copy scanned in chart (See row information) Yes - validated most recent copy scanned in chart (See row information) No - copy requested Yes - validated most recent copy scanned in chart (See row information)   Would patient like information on creating a medical advance directive?       Yes (MAU/Ambulatory/Procedural Areas - Information given)    Current Medications (verified) Outpatient Encounter Medications as of 12/07/2023  Medication Sig   ASHWAGANDHA PO Take by mouth.   B Complex-C-Folic Acid (HM SUPER VITAMIN B COMPLEX/C PO) Take by mouth.   calcium-vitamin D 250-100 MG-UNIT per tablet Take 1 tablet by mouth 2 (two) times daily.   Ferrous Fumarate (FERROCITE PO) Take by mouth.   fluticasone (FLONASE) 50 MCG/ACT nasal spray Place 2 sprays into both nostrils daily.   fluticasone-salmeterol (ADVAIR) 250-50 MCG/ACT AEPB Inhale 1 puff into the lungs in the morning and at bedtime.   MAGNESIUM GLYCINATE PO Take by mouth.   Multiple Vitamin (MULTIVITAMIN) capsule Take 1 capsule by mouth daily.   Nutritional Supplements (OSTEO ADVANCE PO) Take by mouth.   Omega-3 Fatty Acids (OMEGA 3 PO) Take by mouth.   Probiotic Product (PROBIOTIC DAILY PO) Take by mouth.   Specialty Vitamins Products (RETAINE VISION PO) Take by mouth.   STRONTIUM GLUCONATE-B6-B12-FA PO Take by mouth.   Turmeric (QC TUMERIC COMPLEX PO) Take by mouth.   UNABLE TO FIND Med Name: Cholacol   No facility-administered encounter medications on file as of  12/07/2023.    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    Sarcoidosis    Seasonal allergies    Thyroid dysfunction     NODULE ABN CELLS 2006   Past Surgical History:  Procedure Laterality Date   ABDOMINAL HYSTERECTOMY  1992   APPENDECTOMY  1978   CARPAL TUNNEL RELEASE Left 1979   CARPAL TUNNEL RELEASE Right 1980   CHOLECYSTECTOMY  1978   COLONOSCOPY  11/08/2014   Dr.Brodie   COSMETIC SURGERY  2008   FRACTURE SURGERY  2008   GALLBLADDER SURGERY  1978   JOINT REPLACEMENT  February 12, 2022   POLYPECTOMY     TIBIA FRACTURE SURGERY Left 2009   had 6 surgeries to repair and debride; and skin graft   TONSILLECTOMY AND ADENOIDECTOMY  1953   TOTAL ANKLE REPLACEMENT Left 02/12/2022   TUBAL LIGATION  1983   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  2011   Family History  Problem Relation Age of Onset   Diabetes Mother    Cancer Mother        BREAST, THYROID   Dementia Mother    Heart disease Father        MI X3   Cancer Father        lung   COPD Father    Hypertension Sister    Hypertension Brother    Hypertension Son        mild    Colon cancer Neg Hx    Esophageal cancer Neg Hx    Stomach cancer Neg Hx    Social History   Socioeconomic History   Marital status: Married    Spouse name: Kristina Mann   Number of children: 3   Years of education: 16   Highest education level: Not on file  Occupational History   Occupation: retired  Tobacco Use   Smoking status: Never   Smokeless tobacco: Never  Vaping Use   Vaping status: Never Used  Substance and Sexual Activity   Alcohol use: Not Currently    Alcohol/week: 1.0 standard drink of alcohol    Types: 1 Glasses of wine per week    Comment: occ wine   Drug use: No   Sexual activity: Not Currently  Other Topics Concern   Not on file  Social History Narrative   ** Merged History Encounter **       O+ BLOOD DONATES Q 3 MONTHS.  EXERCISE-WALKS.   Mother in an assisted living facility in [place], where the patient's sister lives.   The patient lives with her husband.  Her son lives in [place], [place].   One daughter lives in [place], the other in [place]. Patient does exercise.   Social Determinants of Health   Financial Resource Strain: Low Risk  (11/30/2023)   Overall Financial Resource Strain (CARDIA)    Difficulty of Paying Living Expenses: Not hard at all  Food Insecurity: No Food Insecurity (11/30/2023)   Hunger Vital Sign    Worried About Running Out of Food in the Last Year: Never true    Ran Out of Food in the Last Year: Never true  Transportation Needs:  No Transportation Needs (11/30/2023)   PRAPARE - Administrator, Civil Service (Medical): No    Lack of Transportation (Non-Medical): No  Physical Activity: Insufficiently Active (11/30/2023)   Exercise Vital Sign    Days of Exercise per Week: 4 days    Minutes of Exercise per Session: 30 min  Stress: No Stress Concern Present (11/30/2023)   [employer] of Occupational Health - Occupational Stress Questionnaire    Feeling of Stress : Only a little  Social Connections: Unknown (11/30/2023)   Social Connection and Isolation Panel [NHANES]    Frequency of Communication with Friends and Family: More than three times a week    Frequency of Social Gatherings with Friends and Family: Twice a week    Attends Religious Services: Not on Marketing executive or Organizations: Yes    Attends Engineer, structural: More than 4 times per year    Marital Status: Married    Tobacco Counseling Counseling given: Not Answered   Clinical Intake:  Pre-visit preparation completed: Yes  Pain : No/denies pain  Nutritional Risks: None Diabetes: No  How often do you need to have someone help you when you read instructions, pamphlets, or other written materials from your doctor or pharmacy?: 1 - Never  Interpreter Needed?: No  Information entered by :: [employer], CMA   Activities of Daily Living    11/30/2023    7:59 PM 11/29/2023   10:01 AM  In your present state of health, do  you have any difficulty performing the following activities:  Hearing? 0 0  Vision? 0 0  Difficulty concentrating or making decisions? 0 0  Walking or climbing stairs? 0 0  Dressing or bathing? 0 0  Doing errands, shopping? 0 0  Preparing Food and eating ? N N  Using the Toilet? N N  In the past six months, have you accidently leaked urine? N N  Do you have problems with loss of bowel control? N N  Managing your Medications? N N  Managing your Finances? N N  Housekeeping or managing your Housekeeping? N N    Patient Care Team: Copland, Gwenlyn Found, MD as PCP - General (Family Medicine) Izell Eagle, MD (Endocrinology) Elise Benne, MD (Ophthalmology) Stacey Drain, MD (Internal Medicine) Shirlean Kelly, MD (Neurosurgery) Ladene Artist, MD (Internal Medicine)  Indicate any recent Medical Services you may have received from other than [facility] providers in the past year (date may be approximate).     Assessment:   This is a routine wellness examination for [place].  Hearing/Vision screen No results found.   Goals Addressed   None    Depression Screen    12/07/2023    1:46 PM 07/20/2023    9:16 AM 02/16/2023   10:43 AM 12/02/2022    9:13 AM 12/30/2021    9:03 AM 11/26/2021    9:46 AM 07/01/2021   10:50 AM  PHQ 2/9 Scores  PHQ - 2 Score 0 0 0 0 0 0 0    Fall Risk    11/30/2023    7:59 PM 11/29/2023   10:01 AM 07/20/2023    9:16 AM 02/16/2023   10:43 AM 12/02/2022    9:06 AM  Fall Risk   Falls in the past year? 0 0 0 0 0  Number falls in past yr: 0 0 0 0 0  Injury with Fall? 0 0 0 0 0  Risk for fall due to : No Fall  Risks  No Fall Risks  No Fall Risks  Follow up Falls evaluation completed  Falls evaluation completed Falls evaluation completed Falls evaluation completed    MEDICARE RISK AT HOME: Medicare Risk at Home Any stairs in or around the home?: Yes If so, are there any without handrails?: Yes Home free of loose throw rugs in walkways, pet beds, electrical  cords, etc?: Yes Adequate lighting in your home to reduce risk of falls?: Yes Life alert?: No Use of a cane, walker or w/c?: No Grab bars in the bathroom?: No Shower chair or bench in shower?: No Elevated toilet seat or a handicapped toilet?: No  TIMED UP AND GO:  Was the test performed?  No    Cognitive Function:        12/07/2023    1:50 PM 12/02/2022    9:24 AM  6CIT Screen  What Year? 0 points 0 points  What month? 0 points 0 points  What time? 0 points 0 points  Count back from 20 0 points 0 points  Months in reverse 0 points 0 points  Repeat phrase 0 points 0 points  Total Score 0 points 0 points    Immunizations Immunization History  Administered Date(s) Administered   Fluad Quad(high Dose 65+) 11/20/2019, 09/29/2022   Influenza Split 09/27/2022   Influenza Whole 11/06/2011   Influenza, High Dose Seasonal PF 12/13/2016, 12/14/2017, 10/25/2018   Influenza, Seasonal, Injecte, Preservative Fre 02/19/2013   Influenza,inj,Quad PF,6+ Mos 12/29/2013, 09/16/2014, 09/17/2015   Influenza-Unspecified 09/30/2020, 10/16/2021, 09/22/2023   PFIZER(Purple Top)SARS-COV-2 Vaccination 02/21/2020, 03/12/2020, 09/30/2020   Pfizer Covid-19 Vaccine Bivalent Booster 64yrs & up 10/16/2021, 09/27/2022   Pneumococcal Conjugate-13 09/16/2014   Pneumococcal Polysaccharide-23 09/26/2004, 03/17/2015   Respiratory Syncytial Virus Vaccine,Recomb Aduvanted(Arexvy) 09/29/2022   Td 06/17/2021   Tdap 01/13/2011   Unspecified SARS-COV-2 Vaccination 09/22/2023   Zoster Recombinant(Shingrix) 07/20/2021, 09/25/2021   Zoster, Live 03/17/2016    TDAP status: Up to date  Flu Vaccine status: Up to date  Pneumococcal vaccine status: Up to date  Covid-19 vaccine status: Information provided on how to obtain vaccines.   Qualifies for Shingles Vaccine? Yes   Zostavax completed Yes   Shingrix Completed?: Yes  Screening Tests Health Maintenance  Topic Date Due   COVID-19 Vaccine (7 - 2023-24  season) 11/17/2023   Medicare Annual Wellness (AWV)  12/03/2023   DTaP/Tdap/Td (3 - Td or Tdap) 06/18/2031   Pneumonia Vaccine 9+ Years old  Completed   INFLUENZA VACCINE  Completed   DEXA SCAN  Completed   Hepatitis C Screening  Completed   Zoster Vaccines- Shingrix  Completed   HPV VACCINES  Aged Out   Colonoscopy  Discontinued    Health Maintenance  Health Maintenance Due  Topic Date Due   COVID-19 Vaccine (7 - 2023-24 season) 11/17/2023   Medicare Annual Wellness (AWV)  12/03/2023    Colorectal cancer screening: No longer required.   Mammogram status: Completed 06/23/23. Repeat every year  Bone Density status: Completed 06/11/21. Results reflect: Bone density results: OSTEOPENIA. Repeat every 2 years. Will contact [facility] to add to mammogram appt in 05/2024  Lung Cancer Screening: (Low Dose CT Chest recommended if Age 55-80 years, 20 pack-year currently smoking OR have quit w/in 15years.) does not qualify.   Additional Screening:  Hepatitis C Screening: does qualify; Completed 09/17/15  Vision Screening: Recommended annual ophthalmology exams for early detection of glaucoma and other disorders of the eye. Is the patient up to date with their annual eye exam?  Yes  Who is the provider or what is the name of the office in which the patient attends annual eye exams? Dr. Manning Charity If pt is not established with a provider, would they like to be referred to a provider to establish care? No .   Dental Screening: Recommended annual dental exams for proper oral hygiene  Diabetic Foot Exam: N/a  Community Resource Referral / Chronic Care Management: CRR required this visit?  No   CCM required this visit?  No     Plan:     I have personally reviewed and noted the following in the patient's chart:   Medical and social history Use of alcohol, tobacco or illicit drugs  Current medications and supplements including opioid prescriptions. Patient is not currently taking opioid  prescriptions. Functional ability and status Nutritional status Physical activity Advanced directives List of other physicians Hospitalizations, surgeries, and ER visits in previous 12 months Vitals Screenings to include cognitive, depression, and falls Referrals and appointments  In addition, I have reviewed and discussed with patient certain preventive protocols, quality metrics, and best practice recommendations. A written personalized care plan for preventive services as well as general preventive health recommendations were provided to patient.     Donne Anon, CMA   12/07/2023   After Visit Summary: (MyChart) Due to this being a telephonic visit, the after visit summary with patients personalized plan was offered to patient via MyChart   Nurse Notes: None

## 2023-12-07 NOTE — Patient Instructions (Signed)
Kristina Mann , Thank you for taking time to come for your Medicare Wellness Visit. I appreciate your ongoing commitment to your health goals. Please review the following plan we discussed and let me know if I can assist you in the future.     This is a list of the screening recommended for you and due dates:  Health Maintenance  Topic Date Due   COVID-19 Vaccine (7 - 2023-24 season) 11/17/2023   Medicare Annual Wellness Visit  12/06/2024   DTaP/Tdap/Td vaccine (3 - Td or Tdap) 06/18/2031   Pneumonia Vaccine  Completed   Flu Shot  Completed   DEXA scan (bone density measurement)  Completed   Hepatitis C Screening  Completed   Zoster (Shingles) Vaccine  Completed   HPV Vaccine  Aged Out   Colon Cancer Screening  Discontinued    Next appointment: Follow up in one year for your annual wellness visit.   Preventive Care 3 Years and Older, Female Preventive care refers to lifestyle choices and visits with your health care provider that can promote health and wellness. What does preventive care include? A yearly physical exam. This is also called an annual well check. Dental exams once or twice a year. Routine eye exams. Ask your health care provider how often you should have your eyes checked. Personal lifestyle choices, including: Daily care of your teeth and gums. Regular physical activity. Eating a healthy diet. Avoiding tobacco and drug use. Limiting alcohol use. Practicing safe sex. Taking low-dose aspirin every day. Taking vitamin and mineral supplements as recommended by your health care provider. What happens during an annual well check? The services and screenings done by your health care provider during your annual well check will depend on your age, overall health, lifestyle risk factors, and family history of disease. Counseling  Your health care provider may ask you questions about your: Alcohol use. Tobacco use. Drug use. Emotional well-being. Home and  relationship well-being. Sexual activity. Eating habits. History of falls. Memory and ability to understand (cognition). Work and work Astronomer. Reproductive health. Screening  You may have the following tests or measurements: Height, weight, and BMI. Blood pressure. Lipid and cholesterol levels. These may be checked every 5 years, or more frequently if you are over 38 years old. Skin check. Lung cancer screening. You may have this screening every year starting at age 39 if you have a 30-pack-year history of smoking and currently smoke or have quit within the past 15 years. Fecal occult blood test (FOBT) of the stool. You may have this test every year starting at age 57. Flexible sigmoidoscopy or colonoscopy. You may have a sigmoidoscopy every 5 years or a colonoscopy every 10 years starting at age 37. Hepatitis C blood test. Hepatitis B blood test. Sexually transmitted disease (STD) testing. Diabetes screening. This is done by checking your blood sugar (glucose) after you have not eaten for a while (fasting). You may have this done every 1-3 years. Bone density scan. This is done to screen for osteoporosis. You may have this done starting at age 9. Mammogram. This may be done every 1-2 years. Talk to your health care provider about how often you should have regular mammograms. Talk with your health care provider about your test results, treatment options, and if necessary, the need for more tests. Vaccines  Your health care provider may recommend certain vaccines, such as: Influenza vaccine. This is recommended every year. Tetanus, diphtheria, and acellular pertussis (Tdap, Td) vaccine. You may need a Td  booster every 10 years. Zoster vaccine. You may need this after age 29. Pneumococcal 13-valent conjugate (PCV13) vaccine. One dose is recommended after age 89. Pneumococcal polysaccharide (PPSV23) vaccine. One dose is recommended after age 47. Talk to your health care provider  about which screenings and vaccines you need and how often you need them. This information is not intended to replace advice given to you by your health care provider. Make sure you discuss any questions you have with your health care provider. Document Released: 01/09/2016 Document Revised: 09/01/2016 Document Reviewed: 10/14/2015 Elsevier Interactive Patient Education  2017 [employer].  Fall Prevention in the Home Falls can cause injuries. They can happen to people of all ages. There are many things you can do to make your home safe and to help prevent falls. What can I do on the outside of my home? Regularly fix the edges of walkways and driveways and fix any cracks. Remove anything that might make you trip as you walk through a door, such as a raised step or threshold. Trim any bushes or trees on the path to your home. Use bright outdoor lighting. Clear any walking paths of anything that might make someone trip, such as rocks or tools. Regularly check to see if handrails are loose or broken. Make sure that both sides of any steps have handrails. Any raised decks and porches should have guardrails on the edges. Have any leaves, snow, or ice cleared regularly. Use sand or salt on walking paths during winter. Clean up any spills in your garage right away. This includes oil or grease spills. What can I do in the bathroom? Use night lights. Install grab bars by the toilet and in the tub and shower. Do not use towel bars as grab bars. Use non-skid mats or decals in the tub or shower. If you need to sit down in the shower, use a plastic, non-slip stool. Keep the floor dry. Clean up any water that spills on the floor as soon as it happens. Remove soap buildup in the tub or shower regularly. Attach bath mats securely with double-sided non-slip rug tape. Do not have throw rugs and other things on the floor that can make you trip. What can I do in the bedroom? Use night lights. Make sure  that you have a light by your bed that is easy to reach. Do not use any sheets or blankets that are too big for your bed. They should not hang down onto the floor. Have a firm chair that has side arms. You can use this for support while you get dressed. Do not have throw rugs and other things on the floor that can make you trip. What can I do in the kitchen? Clean up any spills right away. Avoid walking on wet floors. Keep items that you use a lot in easy-to-reach places. If you need to reach something above you, use a strong step stool that has a grab bar. Keep electrical cords out of the way. Do not use floor polish or wax that makes floors slippery. If you must use wax, use non-skid floor wax. Do not have throw rugs and other things on the floor that can make you trip. What can I do with my stairs? Do not leave any items on the stairs. Make sure that there are handrails on both sides of the stairs and use them. Fix handrails that are broken or loose. Make sure that handrails are as long as the stairways. Check any carpeting  to make sure that it is firmly attached to the stairs. Fix any carpet that is loose or worn. Avoid having throw rugs at the top or bottom of the stairs. If you do have throw rugs, attach them to the floor with carpet tape. Make sure that you have a light switch at the top of the stairs and the bottom of the stairs. If you do not have them, ask someone to add them for you. What else can I do to help prevent falls? Wear shoes that: Do not have high heels. Have rubber bottoms. Are comfortable and fit you well. Are closed at the toe. Do not wear sandals. If you use a stepladder: Make sure that it is fully opened. Do not climb a closed stepladder. Make sure that both sides of the stepladder are locked into place. Ask someone to hold it for you, if possible. Clearly mark and make sure that you can see: Any grab bars or handrails. First and last steps. Where the edge of  each step is. Use tools that help you move around (mobility aids) if they are needed. These include: Canes. Walkers. Scooters. Crutches. Turn on the lights when you go into a dark area. Replace any light bulbs as soon as they burn out. Set up your furniture so you have a clear path. Avoid moving your furniture around. If any of your floors are uneven, fix them. If there are any pets around you, be aware of where they are. Review your medicines with your doctor. Some medicines can make you feel dizzy. This can increase your chance of falling. Ask your doctor what other things that you can do to help prevent falls. This information is not intended to replace advice given to you by your health care provider. Make sure you discuss any questions you have with your health care provider. Document Released: 10/09/2009 Document Revised: 05/20/2016 Document Reviewed: 01/17/2015 Elsevier Interactive Patient Education  2017 [employer].

## 2024-01-07 ENCOUNTER — Encounter: Payer: Self-pay | Admitting: Family Medicine

## 2024-01-07 DIAGNOSIS — D869 Sarcoidosis, unspecified: Secondary | ICD-10-CM

## 2024-01-09 MED ORDER — FLUTICASONE-SALMETEROL 250-50 MCG/ACT IN AEPB: 1.0000 | INHALATION_SPRAY | Freq: Two times a day (BID) | RESPIRATORY_TRACT | 3 refills | Status: AC

## 2024-01-09 NOTE — Addendum Note (Signed)
 Addended by: Abbe Amsterdam C on: 01/09/2024 12:53 PM   Modules accepted: Orders

## 2024-01-10 DIAGNOSIS — H00011 Hordeolum externum right upper eyelid: Secondary | ICD-10-CM | POA: Diagnosis not present

## 2024-01-10 DIAGNOSIS — E119 Type 2 diabetes mellitus without complications: Secondary | ICD-10-CM | POA: Diagnosis not present

## 2024-01-10 DIAGNOSIS — H10501 Unspecified blepharoconjunctivitis, right eye: Secondary | ICD-10-CM | POA: Diagnosis not present

## 2024-01-10 DIAGNOSIS — H31001 Unspecified chorioretinal scars, right eye: Secondary | ICD-10-CM | POA: Diagnosis not present

## 2024-01-10 DIAGNOSIS — H52203 Unspecified astigmatism, bilateral: Secondary | ICD-10-CM | POA: Diagnosis not present

## 2024-01-10 LAB — HM DIABETES EYE EXAM

## 2024-01-17 DIAGNOSIS — H10501 Unspecified blepharoconjunctivitis, right eye: Secondary | ICD-10-CM | POA: Diagnosis not present

## 2024-01-17 DIAGNOSIS — H00011 Hordeolum externum right upper eyelid: Secondary | ICD-10-CM | POA: Diagnosis not present

## 2024-01-27 NOTE — Patient Instructions (Incomplete)
It was great to see you again today, I will be in touch with your lab work.  Assuming all is well please see me in about 6 months I will get you referred to see hand surgery for the cyst on your finger  Recommend covid booster if none in the last 6 months or so Try not to stress about your weight- keep working on your very good exercise program for your health

## 2024-01-27 NOTE — Progress Notes (Unsigned)
[facility] [address] [place], [place] [address] [phone]  Date:  01/30/2024   Name:  Kristina Mann   DOB:  01/12/1946   MRN:  [id number]  PCP:  Pearline Cables, MD    Chief Complaint: No chief complaint on file.   History of Present Illness:  Kristina Mann is a 78 y.o. very pleasant female patient who presents with the following:  Patient seen today for 75-month follow-up Most recent visit with myself was in July History of prediabetes, hypertension, sarcoidosis, osteopenia, subclinical hyperthyroidism, chronic left ankle swelling due to fracture 2009.  She did have her ankle replaced 2/23 at [facility] with improvement of her function  She follows with endocrinology, Dr. Quintin [facility] regarding her goiter, subclinical hyperthyroidism Her lab work in July showed minimally suppressed TSH-we did a recheck a month later, back to normal  Shingles, pneumonia, flu all up to date She has completed RSV Recommend COVID booster if not up-to-date Mammogram is up-to-date DEXA scan can be updated Colon cancer screening 2023  [facility] Patient Active Problem List   Diagnosis Date Noted   Osteopenia 03/30/2017   Trigger finger, acquired 01/18/2017   Multinodular goiter 05/12/2016   Subclinical hyperthyroidism 05/12/2016   Pre-diabetes 06/16/2015   Low back pain 11/20/2013   Seasonal allergies 01/20/2012   Thyroid nodule 01/20/2012   Sarcoidosis 01/20/2012   HTN (hypertension) 01/20/2012   Malignant basal cell neoplasm of skin 01/20/2012    Past Medical History:  Diagnosis Date   Allergy 1985   Basal cell cancer 06/2005   Patient denies any cancer   Blood transfusion without reported diagnosis    Bronchitis    Chicken pox    GERD (gastroesophageal reflux disease)    Goiter    Hypertension    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   Sarcoidosis    Seasonal allergies    Thyroid dysfunction      NODULE ABN CELLS 2006    Past Surgical History:  Procedure Laterality Date   ABDOMINAL HYSTERECTOMY  1992   APPENDECTOMY  1978   CARPAL TUNNEL RELEASE Left 1979   CARPAL TUNNEL RELEASE Right 1980   CHOLECYSTECTOMY  1978   COLONOSCOPY  11/08/2014   Dr.Brodie   COSMETIC SURGERY  2008   FRACTURE SURGERY  2008   GALLBLADDER SURGERY  1978   JOINT REPLACEMENT  February 12, 2022   POLYPECTOMY     TIBIA FRACTURE SURGERY Left 2009   had 6 surgeries to repair and debride; and skin graft   TONSILLECTOMY AND ADENOIDECTOMY  1953   TOTAL ANKLE REPLACEMENT Left 02/12/2022   TUBAL LIGATION  1983   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  2011    Social History   Tobacco Use   Smoking status: Never   Smokeless tobacco: Never  Vaping Use   Vaping status: Never Used  Substance Use Topics   Alcohol use: Not Currently    Alcohol/week: 1.0 standard drink of alcohol    Types: 1 Glasses of wine per week    Comment: occ wine   Drug use: No    Family History  Problem Relation Age of Onset   Diabetes Mother    Cancer Mother        BREAST, THYROID   Dementia Mother    Heart disease Father        MI X3   Cancer Father        lung  COPD Father    Hypertension Sister    Hypertension Brother    Hypertension Son        mild    Colon cancer Neg Hx    Esophageal cancer Neg Hx    Stomach cancer Neg Hx     Allergies  Allergen Reactions   Hornet Venom Anaphylaxis   Codeine Nausea And Vomiting    Medication list has been reviewed and updated.  Current Outpatient Medications on File Prior to Visit  Medication Sig Dispense Refill   ASHWAGANDHA PO Take by mouth.     B Complex-C-Folic Acid (HM SUPER VITAMIN B COMPLEX/C PO) Take by mouth.     calcium-vitamin D 250-100 MG-UNIT per tablet Take 1 tablet by mouth 2 (two) times daily.     Ferrous Fumarate (FERROCITE PO) Take by mouth.     fluticasone (FLONASE) 50 MCG/ACT nasal spray Place 2 sprays into both nostrils daily. 16 g 1   fluticasone-salmeterol (ADVAIR)  250-50 MCG/ACT AEPB Inhale 1 puff into the lungs in the morning and at bedtime. 180 each 3   MAGNESIUM GLYCINATE PO Take by mouth.     Multiple Vitamin (MULTIVITAMIN) capsule Take 1 capsule by mouth daily.     Nutritional Supplements (OSTEO ADVANCE PO) Take by mouth.     Omega-3 Fatty Acids (OMEGA 3 PO) Take by mouth.     Probiotic Product (PROBIOTIC DAILY PO) Take by mouth.     Specialty Vitamins Products (RETAINE VISION PO) Take by mouth.     STRONTIUM GLUCONATE-B6-B12-FA PO Take by mouth.     Turmeric (QC TUMERIC COMPLEX PO) Take by mouth.     UNABLE TO FIND Med Name: Cholacol     No current facility-administered medications on file prior to visit.    Review of Systems:  As per HPI- otherwise negative.   Physical Examination: There were no vitals filed for this visit. There were no vitals filed for this visit. There is no height or weight on file to calculate BMI. Ideal Body Weight:    GEN: no acute distress. HEENT: Atraumatic, Normocephalic.  Ears and Nose: No external deformity. CV: RRR, No M/G/R. No JVD. No thrill. No extra heart sounds. PULM: CTA B, no wheezes, crackles, rhonchi. No retractions. No resp. distress. No accessory muscle use. ABD: S, NT, ND, +BS. No rebound. No HSM. EXTR: No c/c/e PSYCH: Normally interactive. Conversant.    Assessment and Plan: ***  Signed Abbe Amsterdam, MD

## 2024-01-30 ENCOUNTER — Encounter: Payer: Self-pay | Admitting: Family Medicine

## 2024-01-30 ENCOUNTER — Ambulatory Visit (INDEPENDENT_AMBULATORY_CARE_PROVIDER_SITE_OTHER): Payer: Medicare Other | Admitting: Family Medicine

## 2024-01-30 VITALS — BP 136/76 | HR 71 | Temp 97.7°F | Resp 18 | Ht 60.0 in | Wt 162.2 lb

## 2024-01-30 DIAGNOSIS — Z13220 Encounter for screening for lipoid disorders: Secondary | ICD-10-CM | POA: Diagnosis not present

## 2024-01-30 DIAGNOSIS — E0590 Thyrotoxicosis, unspecified without thyrotoxic crisis or storm: Secondary | ICD-10-CM | POA: Diagnosis not present

## 2024-01-30 DIAGNOSIS — R7303 Prediabetes: Secondary | ICD-10-CM

## 2024-01-30 DIAGNOSIS — D869 Sarcoidosis, unspecified: Secondary | ICD-10-CM

## 2024-01-30 DIAGNOSIS — M67449 Ganglion, unspecified hand: Secondary | ICD-10-CM

## 2024-01-30 DIAGNOSIS — E669 Obesity, unspecified: Secondary | ICD-10-CM | POA: Diagnosis not present

## 2024-01-30 DIAGNOSIS — I10 Essential (primary) hypertension: Secondary | ICD-10-CM | POA: Diagnosis not present

## 2024-01-30 LAB — HEMOGLOBIN A1C: Hgb A1c MFr Bld: 5.8 % (ref 4.6–6.5)

## 2024-01-30 LAB — COMPREHENSIVE METABOLIC PANEL
ALT: 19 U/L (ref 0–35)
AST: 18 U/L (ref 0–37)
Albumin: 4.2 g/dL (ref 3.5–5.2)
Alkaline Phosphatase: 85 U/L (ref 39–117)
BUN: 12 mg/dL (ref 6–23)
CO2: 27 meq/L (ref 19–32)
Calcium: 8.9 mg/dL (ref 8.4–10.5)
Chloride: 106 meq/L (ref 96–112)
Creatinine, Ser: 0.71 mg/dL (ref 0.40–1.20)
GFR: 82.74 mL/min (ref >60.00)
Glucose, Bld: 94 mg/dL (ref 70–99)
Potassium: 4.3 meq/L (ref 3.5–5.1)
Sodium: 144 meq/L (ref 135–145)
Total Bilirubin: 0.4 mg/dL (ref 0.2–1.2)
Total Protein: 6.3 g/dL (ref 6.0–8.3)

## 2024-01-30 LAB — LIPID PANEL
Cholesterol: 191 mg/dL (ref 0–200)
HDL: 90.3 mg/dL (ref >39.00)
LDL Cholesterol: 87 mg/dL (ref 0–99)
NonHDL: 100.44
Total CHOL/HDL Ratio: 2
Triglycerides: 67 mg/dL (ref 0.0–149.0)
VLDL: 13.4 mg/dL (ref 0.0–40.0)

## 2024-01-30 LAB — CBC
HCT: 40.5 % (ref 36.0–46.0)
Hemoglobin: 13.3 g/dL (ref 12.0–15.0)
MCHC: 32.9 g/dL (ref 30.0–36.0)
MCV: 91.7 fL (ref 78.0–100.0)
Platelets: 323 10*3/uL (ref 150.0–400.0)
RBC: 4.42 Mil/uL (ref 3.87–5.11)
RDW: 14.4 % (ref 11.5–15.5)
WBC: 5.3 10*3/uL (ref 4.0–10.5)

## 2024-01-30 LAB — TSH: TSH: 0.73 u[IU]/mL (ref 0.35–5.50)

## 2024-02-02 DIAGNOSIS — M67449 Ganglion, unspecified hand: Secondary | ICD-10-CM | POA: Insufficient documentation

## 2024-02-02 DIAGNOSIS — R2232 Localized swelling, mass and lump, left upper limb: Secondary | ICD-10-CM | POA: Diagnosis not present

## 2024-02-14 DIAGNOSIS — R2232 Localized swelling, mass and lump, left upper limb: Secondary | ICD-10-CM | POA: Diagnosis not present

## 2024-02-28 DIAGNOSIS — R2230 Localized swelling, mass and lump, unspecified upper limb: Secondary | ICD-10-CM | POA: Diagnosis not present

## 2024-02-28 DIAGNOSIS — M65311 Trigger thumb, right thumb: Secondary | ICD-10-CM | POA: Diagnosis not present

## 2024-03-15 DIAGNOSIS — M19172 Post-traumatic osteoarthritis, left ankle and foot: Secondary | ICD-10-CM | POA: Diagnosis not present

## 2024-03-15 DIAGNOSIS — Z96662 Presence of left artificial ankle joint: Secondary | ICD-10-CM | POA: Diagnosis not present

## 2024-04-25 ENCOUNTER — Encounter: Payer: Self-pay | Admitting: Family Medicine

## 2024-04-25 DIAGNOSIS — Z85828 Personal history of other malignant neoplasm of skin: Secondary | ICD-10-CM

## 2024-04-26 NOTE — Telephone Encounter (Signed)
 Pended referral

## 2024-05-08 DIAGNOSIS — Z4789 Encounter for other orthopedic aftercare: Secondary | ICD-10-CM | POA: Insufficient documentation

## 2024-06-20 ENCOUNTER — Other Ambulatory Visit (HOSPITAL_BASED_OUTPATIENT_CLINIC_OR_DEPARTMENT_OTHER): Payer: Self-pay

## 2024-06-21 DIAGNOSIS — Z4789 Encounter for other orthopedic aftercare: Secondary | ICD-10-CM | POA: Diagnosis not present

## 2024-06-21 DIAGNOSIS — M65311 Trigger thumb, right thumb: Secondary | ICD-10-CM | POA: Insufficient documentation

## 2024-06-21 DIAGNOSIS — R2231 Localized swelling, mass and lump, right upper limb: Secondary | ICD-10-CM | POA: Diagnosis not present

## 2024-06-25 DIAGNOSIS — R29890 Loss of height: Secondary | ICD-10-CM | POA: Diagnosis not present

## 2024-06-25 DIAGNOSIS — M8588 Other specified disorders of bone density and structure, other site: Secondary | ICD-10-CM | POA: Diagnosis not present

## 2024-06-25 DIAGNOSIS — Z8262 Family history of osteoporosis: Secondary | ICD-10-CM | POA: Diagnosis not present

## 2024-06-25 DIAGNOSIS — Z1231 Encounter for screening mammogram for malignant neoplasm of breast: Secondary | ICD-10-CM | POA: Diagnosis not present

## 2024-06-25 LAB — HM MAMMOGRAPHY

## 2024-06-25 LAB — HM DEXA SCAN

## 2024-06-28 ENCOUNTER — Encounter: Payer: Self-pay | Admitting: Family Medicine

## 2024-07-08 ENCOUNTER — Encounter: Payer: Self-pay | Admitting: Family Medicine

## 2024-07-08 DIAGNOSIS — Z23 Encounter for immunization: Secondary | ICD-10-CM | POA: Diagnosis not present

## 2024-07-09 ENCOUNTER — Encounter: Payer: Self-pay | Admitting: Family Medicine

## 2024-07-19 DIAGNOSIS — M65311 Trigger thumb, right thumb: Secondary | ICD-10-CM | POA: Diagnosis not present

## 2024-07-24 NOTE — Patient Instructions (Addendum)
 Great to see you again today!  I will be in touch with your labs Assuming all is well please see me in about 6 months

## 2024-07-24 NOTE — Progress Notes (Signed)
 Regular yes follow-up at [facility] at [facility] [address], [address] [place], [place] [address] [phone]  Date:  07/30/2024   Name:  Kristina Mann   DOB:  [birth year]   MRN:  [id number]  PCP:  Watt Harlene BROCKS, MD    Chief Complaint: Follow-up (Pt states she has a rash on my lower abdomen that comes and goes, it came back about a week ago it itches /)   History of Present Illness:  Kristina Mann is a 78 y.o. very pleasant female patient who presents with the following:  Pt seen today for periodic recheck Last seen by myself in February   History of prediabetes, hypertension, sarcoidosis, osteopenia, subclinical hyperthyroidism, chronic left ankle swelling due to fracture 2009.  She did have her ankle replaced 2/23 at [facility] with improvement of her function- most recent recheck visit in March of this year.  Endocrinology- DR Claudene at [facility] - pt notes she was released as far as her thyroid   Labs done in February of this year Pt notes they moved to a smaller home- a condo however master is still on the 2nd floor  Ankle is doing well  She is not having sx of sarcoidosis, using her inhaler on a regular basis  Lab Results  Component Value Date   TSH 0.73 01/30/2024   She has noted a rash on her lower belly- it will come and go, started perhaps a week ago but she has had this over the summer Off and on for a year or so - she tried neosporin which did help She had a covid booster and the newest pneumonia shot recently She has completed her RSV and shingles vaccine      Patient Active Problem List   Diagnosis Date Noted   Osteopenia 03/30/2017   Trigger finger, acquired 01/18/2017   Multinodular goiter 05/12/2016   Subclinical hyperthyroidism 05/12/2016   Pre-diabetes 06/16/2015   Low back pain 11/20/2013   Seasonal allergies 01/20/2012   Thyroid  nodule 01/20/2012   Sarcoidosis 01/20/2012   HTN (hypertension) 01/20/2012    Malignant basal cell neoplasm of skin 01/20/2012    Past Medical History:  Diagnosis Date   Allergy  1985   Basal cell cancer 06/2005   Patient denies any cancer   Blood transfusion without reported diagnosis    Bronchitis    Chicken pox    GERD (gastroesophageal reflux disease)    Goiter    Hypertension    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   Sarcoidosis    Seasonal allergies    Thyroid  dysfunction     NODULE ABN CELLS 2006    Past Surgical History:  Procedure Laterality Date   ABDOMINAL HYSTERECTOMY  1992   APPENDECTOMY  1978   CARPAL TUNNEL RELEASE Left 1979   CARPAL TUNNEL RELEASE Right 1980   CHOLECYSTECTOMY  1978   COLONOSCOPY  11/08/2014   Dr.Brodie   COSMETIC SURGERY  2008   FRACTURE SURGERY  2008   GALLBLADDER SURGERY  1978   JOINT REPLACEMENT  February 12, 2022   POLYPECTOMY     TIBIA FRACTURE SURGERY Left 2009   had 6 surgeries to repair and debride; and skin graft   TONSILLECTOMY AND ADENOIDECTOMY  1953   TOTAL ANKLE REPLACEMENT Left 02/12/2022   TUBAL LIGATION  1983   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  2011    Social History   Tobacco Use   Smoking status: Never   Smokeless tobacco: Never  Vaping Use   Vaping status: Never Used  Substance Use Topics   Alcohol use: Not Currently    Alcohol/week: 1.0 standard drink of alcohol    Types: 1 Glasses of wine per week    Comment: occ wine   Drug use: No    Family History  Problem Relation Age of Onset   Diabetes Mother    Cancer Mother        BREAST, THYROID    Dementia Mother    Heart disease Father        MI X3   Cancer Father        lung   COPD Father    Hypertension Sister    Hypertension Brother    Hypertension Son        mild    Colon cancer Neg Hx    Esophageal cancer Neg Hx    Stomach cancer Neg Hx     Allergies  Allergen Reactions   Hornet Venom Anaphylaxis   Codeine Nausea And Vomiting    Medication list has been reviewed and updated.  Current Outpatient Medications on File Prior  to Visit  Medication Sig Dispense Refill   ASHWAGANDHA PO Take by mouth.     B Complex-C-Folic Acid (HM SUPER VITAMIN B COMPLEX/C PO) Take by mouth.     calcium-vitamin D  250-100 MG-UNIT per tablet Take 1 tablet by mouth 2 (two) times daily.     Ferrous Fumarate (FERROCITE PO) Take by mouth.     fluticasone -salmeterol (ADVAIR ) 250-50 MCG/ACT AEPB Inhale 1 puff into the lungs in the morning and at bedtime. 180 each 3   MAGNESIUM GLYCINATE PO Take by mouth.     Multiple Vitamin (MULTIVITAMIN) capsule Take 1 capsule by mouth daily.     Nutritional Supplements (OSTEO ADVANCE PO) Take by mouth.     Omega-3 Fatty Acids (OMEGA 3 PO) Take by mouth.     Probiotic Product (PROBIOTIC DAILY PO) Take by mouth.     Specialty Vitamins Products (RETAINE VISION PO) Take by mouth.     STRONTIUM GLUCONATE-B6-B12-FA PO Take by mouth.     Turmeric (QC TUMERIC COMPLEX PO) Take by mouth.     UNABLE TO FIND Med Name: Cholacol     No current facility-administered medications on file prior to visit.    Review of Systems:  As per HPI- otherwise negative.   Physical Examination: Vitals:   07/30/24 0825  BP: 124/72  Pulse: 65  SpO2: 95%   Vitals:   07/30/24 0825  Weight: 152 lb (68.9 kg)  Height: 5' (1.524 m)   Body mass index is 29.69 kg/m. Ideal Body Weight: Weight in (lb) to have BMI = 25: 127.7  GEN: no acute distress.  Minimal overweight, looks well  HEENT: Atraumatic, Normocephalic.  Bilateral TM wnl, oropharynx normal.  PEERL,EOMI.   Ears and Nose: No external deformity. CV: RRR, No M/G/R. No JVD. No thrill. No extra heart sounds. PULM: CTA B, no wheezes, crackles, rhonchi. No retractions. No resp. distress. No accessory muscle use. ABD: S, NT, ND. No rebound. No HSM. EXTR: No c/c/e PSYCH: Normally interactive. Conversant.  Minimal candida intertrigo under abdominal fold    Assessment and Plan: Subclinical hyperthyroidism - Plan: TSH  Primary hypertension - Plan: Basic metabolic  panel with GFR, CBC  Pre-diabetes - Plan: Hemoglobin A1c  Sarcoidosis  Candidal intertrigo - Plan: nystatin  (MYCOSTATIN /NYSTOP ) powder  Patient seen today for follow-up.  Follow-up on her thyroid  today Blood pressure well-controlled, check labs Follow-up in  prediabetes Mild Candida intertrigo, counseled regarding keeping her skin fold area dry and gave her terbinafine powder to use as needed  Will plan further follow- up pending labs. Plan to follow-up in 6 months  Signed Harlene Schroeder, MD  Received labs as below, message to patient  Results for orders placed or performed in visit on 07/30/24  Basic metabolic panel with GFR   Collection Time: 07/30/24  8:49 AM  Result Value Ref Range   Sodium 144 135 - 145 mEq/L   Potassium 5.0 3.5 - 5.1 mEq/L   Chloride 108 96 - 112 mEq/L   CO2 28 19 - 32 mEq/L   Glucose, Bld 94 70 - 99 mg/dL   BUN 13 6 - 23 mg/dL   Creatinine, Ser 9.29 0.40 - 1.20 mg/dL   GFR 16.12 >39.99 mL/min   Calcium 9.3 8.4 - 10.5 mg/dL  CBC   Collection Time: 07/30/24  8:49 AM  Result Value Ref Range   WBC 5.3 4.0 - 10.5 K/uL   RBC 4.66 3.87 - 5.11 Mil/uL   Platelets 285.0 150.0 - 400.0 K/uL   Hemoglobin 13.6 12.0 - 15.0 g/dL   HCT 58.0 63.9 - 53.9 %   MCV 89.9 78.0 - 100.0 fl   MCHC 32.5 30.0 - 36.0 g/dL   RDW 85.3 88.4 - 84.4 %  Hemoglobin A1c   Collection Time: 07/30/24  8:49 AM  Result Value Ref Range   Hgb A1c MFr Bld 5.8 4.6 - 6.5 %  TSH   Collection Time: 07/30/24  8:49 AM  Result Value Ref Range   TSH 0.82 0.35 - 5.50 uIU/mL

## 2024-07-30 ENCOUNTER — Ambulatory Visit (INDEPENDENT_AMBULATORY_CARE_PROVIDER_SITE_OTHER): Payer: Medicare Other | Admitting: Family Medicine

## 2024-07-30 ENCOUNTER — Encounter: Payer: Self-pay | Admitting: Family Medicine

## 2024-07-30 VITALS — BP 124/72 | HR 65 | Ht 60.0 in | Wt 152.0 lb

## 2024-07-30 DIAGNOSIS — D869 Sarcoidosis, unspecified: Secondary | ICD-10-CM | POA: Diagnosis not present

## 2024-07-30 DIAGNOSIS — I10 Essential (primary) hypertension: Secondary | ICD-10-CM

## 2024-07-30 DIAGNOSIS — R7303 Prediabetes: Secondary | ICD-10-CM | POA: Diagnosis not present

## 2024-07-30 DIAGNOSIS — E0590 Thyrotoxicosis, unspecified without thyrotoxic crisis or storm: Secondary | ICD-10-CM | POA: Diagnosis not present

## 2024-07-30 DIAGNOSIS — B372 Candidiasis of skin and nail: Secondary | ICD-10-CM | POA: Diagnosis not present

## 2024-07-30 LAB — BASIC METABOLIC PANEL WITH GFR
BUN: 13 mg/dL (ref 6–23)
CO2: 28 meq/L (ref 19–32)
Calcium: 9.3 mg/dL (ref 8.4–10.5)
Chloride: 108 meq/L (ref 96–112)
Creatinine, Ser: 0.7 mg/dL (ref 0.40–1.20)
GFR: 83.87 mL/min (ref >60.00)
Glucose, Bld: 94 mg/dL (ref 70–99)
Potassium: 5 meq/L (ref 3.5–5.1)
Sodium: 144 meq/L (ref 135–145)

## 2024-07-30 LAB — HEMOGLOBIN A1C: Hgb A1c MFr Bld: 5.8 % (ref 4.6–6.5)

## 2024-07-30 LAB — CBC
HCT: 41.9 % (ref 36.0–46.0)
Hemoglobin: 13.6 g/dL (ref 12.0–15.0)
MCHC: 32.5 g/dL (ref 30.0–36.0)
MCV: 89.9 fl (ref 78.0–100.0)
Platelets: 285 K/uL (ref 150.0–400.0)
RBC: 4.66 Mil/uL (ref 3.87–5.11)
RDW: 14.6 % (ref 11.5–15.5)
WBC: 5.3 K/uL (ref 4.0–10.5)

## 2024-07-30 LAB — TSH: TSH: 0.82 u[IU]/mL (ref 0.35–5.50)

## 2024-07-30 MED ORDER — NYSTATIN 100000 UNIT/GM EX POWD: 1.0000 | Freq: Three times a day (TID) | CUTANEOUS | 1 refills | Status: AC

## 2024-08-01 ENCOUNTER — Ambulatory Visit: Admitting: Dermatology

## 2024-08-01 ENCOUNTER — Encounter: Payer: Self-pay | Admitting: Dermatology

## 2024-08-01 DIAGNOSIS — D485 Neoplasm of uncertain behavior of skin: Secondary | ICD-10-CM

## 2024-08-01 DIAGNOSIS — L821 Other seborrheic keratosis: Secondary | ICD-10-CM

## 2024-08-01 DIAGNOSIS — L578 Other skin changes due to chronic exposure to nonionizing radiation: Secondary | ICD-10-CM | POA: Diagnosis not present

## 2024-08-01 DIAGNOSIS — L570 Actinic keratosis: Secondary | ICD-10-CM | POA: Diagnosis not present

## 2024-08-01 DIAGNOSIS — D492 Neoplasm of unspecified behavior of bone, soft tissue, and skin: Secondary | ICD-10-CM

## 2024-08-01 DIAGNOSIS — L738 Other specified follicular disorders: Secondary | ICD-10-CM | POA: Diagnosis not present

## 2024-08-01 DIAGNOSIS — Z1283 Encounter for screening for malignant neoplasm of skin: Secondary | ICD-10-CM | POA: Diagnosis not present

## 2024-08-01 DIAGNOSIS — D229 Melanocytic nevi, unspecified: Secondary | ICD-10-CM

## 2024-08-01 DIAGNOSIS — W908XXA Exposure to other nonionizing radiation, initial encounter: Secondary | ICD-10-CM

## 2024-08-01 DIAGNOSIS — D1801 Hemangioma of skin and subcutaneous tissue: Secondary | ICD-10-CM

## 2024-08-01 DIAGNOSIS — L814 Other melanin hyperpigmentation: Secondary | ICD-10-CM

## 2024-08-01 DIAGNOSIS — L739 Follicular disorder, unspecified: Secondary | ICD-10-CM | POA: Diagnosis not present

## 2024-08-01 NOTE — Patient Instructions (Addendum)
 Skin Education : We counseled the patient regarding the following: Sun screen (SPF 30 or greater) should be applied during peak UV exposure (between 10am and 2pm) and reapplied after exercise or swimming.  The ABCDEs of melanoma were reviewed with the patient, and the importance of monthly self-examination of moles was emphasized. Should any moles change in shape or color, or itch, bleed or burn, pt will contact our office for evaluation sooner then their interval appointment.  Plan: Sunscreen Recommendations We recommended a broad spectrum sunscreen with a SPF of 30 or higher.  SPF 30 sunscreens block approximately 97 percent of the sun's harmful rays. Sunscreens should be applied at least 15 minutes prior to expected sun exposure and then every 2 hours after that as long as sun exposure continues. If swimming or exercising sunscreen should be reapplied every 45 minutes to an hour after getting wet or sweating. One ounce, or the equivalent of a shot glass full of sunscreen, is adequate to protect the skin not covered by a bathing suit. We also recommended a lip balm with a sunscreen as well. Sun protective clothing can be used in lieu of sunscreen but must be worn the entire time you are exposed to the sun's rays.     Patient Handout: Wound Care for Skin Biopsy Site  Taking Care of Your Skin Biopsy Site  Proper care of the biopsy site is essential for promoting healing and minimizing scarring. This handout provides instructions on how to care for your biopsy site to ensure optimal recovery.  1. Cleaning the Wound:  Clean the biopsy site daily with gentle soap and water. Gently pat the area dry with a clean, soft towel. Avoid harsh scrubbing or rubbing the area, as this can irritate the skin and delay healing.  2. Applying Aquaphor and Bandage:  After cleaning the wound, apply a thin layer of Aquaphor ointment to the biopsy site. Cover the area with a sterile bandage to protect it from  dirt, bacteria, and friction. Change the bandage daily or as needed if it becomes soiled or wet.  3. Continued Care for One Week:  Repeat the cleaning, Aquaphor application, and bandaging process daily for one week following the biopsy procedure. Keeping the wound clean and moist during this initial healing period will help prevent infection and promote optimal healing.  4. Massaging Aquaphor into the Area:  ---After one week, discontinue the use of bandages but continue to apply Aquaphor to the biopsy site. ----Gently massage the Aquaphor into the area using circular motions. ---Massaging the skin helps to promote circulation and prevent the formation of scar tissue.   Additional Tips:  Avoid exposing the biopsy site to direct sunlight during the healing process, as this can cause hyperpigmentation or worsen scarring. If you experience any signs of infection, such as increased redness, swelling, warmth, or drainage from the wound, contact your healthcare provider immediately. Follow any additional instructions provided by your healthcare provider for caring for the biopsy site and managing any discomfort. Conclusion:  Taking proper care of your skin biopsy site is crucial for ensuring optimal healing and minimizing scarring. By following these instructions for cleaning, applying Aquaphor, and massaging the area, you can promote a smooth and successful recovery. If you have any questions or concerns about caring for your biopsy site, don't hesitate to contact your healthcare provider for guidance.    Important Information   Due to recent changes in healthcare laws, you may see results of your pathology and/or laboratory studies  on MyChart before the doctors have had a chance to review them. We understand that in some cases there may be results that are confusing or concerning to you. Please understand that not all results are received at the same time and often the doctors may need to  interpret multiple results in order to provide you with the best plan of care or course of treatment. Therefore, we ask that you please give us  2 business days to thoroughly review all your results before contacting the office for clarification. Should we see a critical lab result, you will be contacted sooner.     If You Need Anything After Your Visit   If you have any questions or concerns for your doctor, please call our main line at [phone]. If no one answers, please leave a voicemail as directed and we will return your call as soon as possible. Messages left after 4 pm will be answered the following business day.    You may also send us  a message via MyChart. We typically respond to MyChart messages within 1-2 business days.  For prescription refills, please ask your pharmacy to contact our office. Our fax number is [phone].  If you have an urgent issue when the clinic is closed that cannot wait until the next business day, you can page your doctor at the number below.     Please note that while we do our best to be available for urgent issues outside of office hours, we are not available 24/7.    If you have an urgent issue and are unable to reach us , you may choose to seek medical care at your doctor's office, retail clinic, urgent care center, or emergency room.   If you have a medical emergency, please immediately call 911 or go to the emergency department. In the event of inclement weather, please call our main line at [phone] for an update on the status of any delays or closures.  Dermatology Medication Tips: Please keep the boxes that topical medications come in in order to help keep track of the instructions about where and how to use these. Pharmacies typically print the medication instructions only on the boxes and not directly on the medication tubes.   If your medication is too expensive, please contact our office at [phone] or send us  a message through  MyChart.    We are unable to tell what your co-pay for medications will be in advance as this is different depending on your insurance coverage. However, we may be able to find a substitute medication at lower cost or fill out paperwork to get insurance to cover a needed medication.    If a prior authorization is required to get your medication covered by your insurance company, please allow us  1-2 business days to complete this process.   Drug prices often vary depending on where the prescription is filled and some pharmacies may offer cheaper prices.   The website www.goodrx.com contains coupons for medications through different pharmacies. The prices here do not account for what the cost may be with help from insurance (it may be cheaper with your insurance), but the website can give you the price if you did not use any insurance.  - You can print the associated coupon and take it with your prescription to the pharmacy.  - You may also stop by our office during regular business hours and pick up a GoodRx coupon card.  - If you need your prescription sent electronically to a  different pharmacy, notify our office through [facility] or by phone at [phone]

## 2024-08-01 NOTE — Progress Notes (Addendum)
 New Patient Visit   Subjective  Kristina Mann is a 78 y.o. female who presents for the following: Skin Cancer Screening and Full Body Skin Exam  The patient presents for Total-Body Skin Exam (TBSE) for skin cancer screening and mole check. The patient has spots, moles and lesions to be evaluated, some may be new or changing.  Pt has no hx of skin cancer but has had spots removed. No family hx    The following portions of the chart were reviewed this encounter and updated as appropriate: medications, allergies, medical history  Review of Systems:  No other skin or systemic complaints except as noted in HPI or Assessment and Plan.  Objective  Well appearing patient in no apparent distress; mood and affect are within normal limits.  A full examination was performed including scalp, head, eyes, ears, nose, lips, neck, chest, axillae, abdomen, back, buttocks, bilateral upper extremities, bilateral lower extremities, hands, feet, fingers, toes, fingernails, and toenails. All findings within normal limits unless otherwise noted below.   Relevant physical exam findings are noted in the Assessment and Plan.       Left Buccal Cheek 5 mmm pink papule Left Forearm - Posterior, Scalp (2) Erythematous thin papules/macules with gritty scale.   Assessment & Plan   SKIN CANCER SCREENING PERFORMED TODAY.  ACTINIC DAMAGE - Chronic condition, secondary to cumulative UV/sun exposure - diffuse scaly erythematous macules with underlying dyspigmentation - Recommend daily broad spectrum sunscreen SPF 30+ to sun-exposed areas, reapply every 2 hours as needed.  - Staying in the shade or wearing long sleeves, sun glasses (UVA+UVB protection) and wide brim hats (4-inch brim around the entire circumference of the hat) are also recommended for sun protection.  - Call for new or changing lesions.  MELANOCYTIC NEVI - Tan-brown and/or pink-flesh-colored symmetric macules and papules - Benign appearing on  exam today - Observation - Call clinic for new or changing moles - Recommend daily use of broad spectrum spf 30+ sunscreen to sun-exposed areas.   LENTIGINES Exam: scattered tan macules Due to sun exposure Treatment Plan: Benign-appearing, observe. Recommend daily broad spectrum sunscreen SPF 30+ to sun-exposed areas, reapply every 2 hours as needed.  Call for any changes   HEMANGIOMA Exam: red papule(s) Discussed benign nature. Recommend observation. Call for changes.   SEBORRHEIC KERATOSIS - Stuck-on, waxy, tan-brown papules and/or plaques  - Benign-appearing - Discussed benign etiology and prognosis. - Observe - Call for any changes  Scattered Previously Cauterized SK with hemorrhagic crusting on chest and back - Treated by patient's daughter who is an Public librarian, treated with cautery  NEOPLASM OF UNCERTAIN BEHAVIOR OF SKIN Left Buccal Cheek Epidermal / dermal shaving  Lesion diameter (cm):  0.5 Informed consent: discussed and consent obtained   Timeout: patient name, date of birth, surgical site, and procedure verified   Procedure prep:  Patient was prepped and draped in usual sterile fashion Prep type:  Isopropyl alcohol Anesthesia: the lesion was anesthetized in a standard fashion   Anesthetic:  1% lidocaine w/ epinephrine 1-100,000 buffered w/ 8.4% NaHCO3 Instrument used: DermaBlade   Hemostasis achieved with: aluminum chloride   Outcome: patient tolerated procedure well   Post-procedure details: wound care instructions given    Specimen 1 - Surgical pathology Differential Diagnosis: Sebaceous Hyperplasia vs NMSC vs other  Check Margins: No AK (ACTINIC KERATOSIS) (3) Left Forearm - Posterior, Scalp (2) Destruction of lesion - Left Forearm - Posterior, Scalp (2) Complexity: simple   Destruction method: cryotherapy   Informed consent:  discussed and consent obtained   Timeout:  patient name, date of birth, surgical site, and procedure verified Cryotherapy  cycles:  3 Outcome: patient tolerated procedure well with no complications   Post-procedure details: wound care instructions given    ACTINIC SKIN DAMAGE   MULTIPLE BENIGN NEVI   CHERRY ANGIOMA   LENTIGINES   SEBORRHEIC KERATOSIS    Return in about 1 year (around 08/01/2025) for TBSE.    Documentation: I have reviewed the above documentation for accuracy and completeness, and I agree with the above.  RUFUS CHRISTELLA HOLY, MD

## 2024-08-06 ENCOUNTER — Ambulatory Visit: Payer: Self-pay | Admitting: Dermatology

## 2024-08-06 LAB — SURGICAL PATHOLOGY

## 2024-08-30 ENCOUNTER — Telehealth: Payer: Self-pay

## 2024-08-30 DIAGNOSIS — Z23 Encounter for immunization: Secondary | ICD-10-CM | POA: Diagnosis not present

## 2024-08-30 MED ORDER — COVID-19 MRNA VACC (MODERNA) 50 MCG/0.5ML IM SUSP: 0.5000 mL | Freq: Once | INTRAMUSCULAR | 0 refills | Status: AC

## 2024-08-30 NOTE — Telephone Encounter (Signed)
 Spoke w/ Pharmacist at [facility]- needing prescription for new covid booster. Rx sent.

## 2024-08-31 ENCOUNTER — Encounter: Payer: Self-pay | Admitting: Family Medicine

## 2024-09-12 ENCOUNTER — Other Ambulatory Visit (HOSPITAL_BASED_OUTPATIENT_CLINIC_OR_DEPARTMENT_OTHER): Payer: Self-pay

## 2024-09-21 DIAGNOSIS — M65311 Trigger thumb, right thumb: Secondary | ICD-10-CM | POA: Diagnosis not present

## 2024-10-30 DIAGNOSIS — M65311 Trigger thumb, right thumb: Secondary | ICD-10-CM | POA: Diagnosis not present

## 2024-11-05 ENCOUNTER — Encounter: Payer: Self-pay | Admitting: Family Medicine

## 2024-11-05 MED ORDER — TRAMADOL HCL 50 MG PO TABS: 50.0000 mg | ORAL_TABLET | Freq: Three times a day (TID) | ORAL | 0 refills | Status: DC | PRN

## 2024-11-05 NOTE — Addendum Note (Signed)
 Addended by: WATT RAISIN C on: 11/05/2024 08:41 PM   Modules accepted: Orders

## 2024-12-17 ENCOUNTER — Ambulatory Visit (INDEPENDENT_AMBULATORY_CARE_PROVIDER_SITE_OTHER): Admitting: *Deleted

## 2024-12-17 VITALS — BP 142/76 | HR 70 | Temp 97.9°F | Resp 16 | Ht 59.0 in | Wt 157.2 lb

## 2024-12-17 DIAGNOSIS — Z0000 Encounter for general adult medical examination without abnormal findings: Secondary | ICD-10-CM | POA: Diagnosis not present

## 2024-12-17 NOTE — Patient Instructions (Addendum)
 Kristina Mann,  Thank you for taking the time for your Medicare Wellness Visit. I appreciate your continued commitment to your health goals. Please review the care plan we discussed, and feel free to reach out if I can assist you further.  Please note that Annual Wellness Visits do not include a physical exam. Some assessments may be limited, especially if the visit was conducted virtually. If needed, we may recommend an in-person follow-up with your provider.  Goal: To maintain a healthy, active lifestyle.  Ongoing Care Seeing your primary care provider every 3 to 6 months helps us  monitor your health and provide consistent, personalized care.   Dr Watt: 02/04/25 9am Medicare AWV: 12/18/25 9am, in person  Referrals If a referral was made during today's visit and you haven't received any updates within two weeks, please contact the referred provider directly to check on the status.  Please complete your mammogram as scheduled in June at [facility].  Recommended Screenings:  Health Maintenance  Topic Date Due   COVID-19 Vaccine (8 - 2025-26 season) 09/02/2024   Medicare Annual Wellness Visit  12/06/2024   Osteoporosis screening with Bone Density Scan  06/25/2026   DTaP/Tdap/Td vaccine (3 - Td or Tdap) 06/18/2031   Pneumococcal Vaccine for age over 37  Completed   Flu Shot  Completed   Hepatitis C Screening  Completed   Zoster (Shingles) Vaccine  Completed   Meningitis B Vaccine  Aged Out   Breast Cancer Screening  Discontinued   Colon Cancer Screening  Discontinued       12/17/2024    8:15 AM  Advanced Directives  Does Patient Have a Medical Advance Directive? Yes  Type of Estate Agent of [place];Living will  Does patient want to make changes to medical advance directive? No - Patient declined  Copy of Healthcare Power of Attorney in Chart? Yes - validated most recent copy scanned in chart (See row information)    Vision: Annual vision screenings are  recommended for early detection of glaucoma, cataracts, and diabetic retinopathy. These exams can also reveal signs of chronic conditions such as diabetes and high blood pressure.  Dental: Annual dental screenings help detect early signs of oral cancer, gum disease, and other conditions linked to overall health, including heart disease and diabetes.  Please see the attached documents for additional preventive care recommendations.

## 2024-12-17 NOTE — Progress Notes (Signed)
 "  Chief Complaint  Patient presents with   Medicare Wellness     Subjective:   Kristina Mann is a 78 y.o. female who presents for a Medicare Annual Wellness Visit.  Visit info / Clinical Intake: Medicare Wellness Visit Type:: Subsequent Annual Wellness Visit Persons participating in visit and providing information:: patient Medicare Wellness Visit Mode:: In-person (required for WTM) Interpreter Needed?: No Pre-visit prep was completed: yes AWV questionnaire completed by patient prior to visit?: no Living arrangements:: lives with spouse/significant other Patient's Overall Health Status Rating: very good Typical amount of pain: some (arthritis in finger) Does pain affect daily life?: no Are you currently prescribed opioids?: no  Dietary Habits and Nutritional Risks How many meals a day?: 3 Eats fruit and vegetables daily?: yes Most meals are obtained by: preparing own meals In the last 2 weeks, have you had any of the following?: none Diabetic:: no  Functional Status Activities of Daily Living (to include ambulation/medication): Independent Ambulation: Independent Medication Administration: Independent Home Management (perform basic housework or laundry): Independent Manage your own finances?: yes Primary transportation is: driving Concerns about vision?: no *vision screening is required for WTM* (Up to date with Selinda Reusing) Concerns about hearing?: (!) yes (has noted some decrease in group settings.) Uses hearing aids?: no  Fall Screening Falls in the past year?: 0 Number of falls in past year: 0 Was there an injury with Fall?: 0 Fall Risk Category Calculator: 0 Patient Fall Risk Level: Low Fall Risk  Fall Risk Patient at Risk for Falls Due to: No Fall Risks Fall risk Follow up: Falls evaluation completed  Home and Transportation Safety: All rugs have non-skid backing?: yes All stairs or steps have railings?: yes Grab bars in the bathtub or shower?: yes Have  non-skid surface in bathtub or shower?: yes Good home lighting?: yes Regular seat belt use?: yes Hospital stays in the last year:: no  Cognitive Assessment Difficulty concentrating, remembering, or making decisions? : no Will 6CIT or Mini Cog be Completed: yes What year is it?: 0 points What month is it?: 0 points Give patient an address phrase to remember (5 components): [address], [place]  About what time is it?: 0 points Count backwards from 20 to 1: 0 points Say the months of the year in reverse: 0 points Repeat the address phrase from earlier: 0 points 6 CIT Score: 0 points  Advance Directives (For Healthcare) Does Patient Have a Medical Advance Directive?: Yes Does patient want to make changes to medical advance directive?: No - Patient declined Type of Advance Directive: Healthcare Power of [place]; Living will Copy of Healthcare Power of Attorney in Chart?: Yes - validated most recent copy scanned in chart (See row information) Copy of Living Will in Chart?: Yes - validated most recent copy scanned in chart (See row information)  Reviewed/Updated  Reviewed/Updated: Reviewed All (Medical, Surgical, Family, Medications, Allergies, Care Teams, Patient Goals)    Allergies (verified) Hornet venom and Codeine   Current Medications (verified) Outpatient Encounter Medications as of 12/17/2024  Medication Sig   ASHWAGANDHA PO Take by mouth.   B Complex-C-Folic Acid (HM SUPER VITAMIN B COMPLEX/C PO) Take by mouth.   calcium-vitamin D  250-100 MG-UNIT per tablet Take 1 tablet by mouth 2 (two) times daily.   Ferrous Fumarate (FERROCITE PO) Take by mouth.   fluticasone -salmeterol (ADVAIR ) 250-50 MCG/ACT AEPB Inhale 1 puff into the lungs in the morning and at bedtime.   MAGNESIUM GLYCINATE PO Take by mouth.   Multiple Vitamin (  MULTIVITAMIN) capsule Take 1 capsule by mouth daily.   Nutritional Supplements (OSTEO ADVANCE PO) Take by mouth.   nystatin  (MYCOSTATIN /NYSTOP )  powder Apply 1 Application topically 3 (three) times daily. (Patient taking differently: Apply 1 Application topically 3 (three) times daily as needed.)   Omega-3 Fatty Acids (OMEGA 3 PO) Take by mouth.   Probiotic Product (PROBIOTIC DAILY PO) Take by mouth.   Specialty Vitamins Products (RETAINE VISION PO) Take by mouth.   STRONTIUM GLUCONATE-B6-B12-FA PO Take by mouth.   Turmeric (QC TUMERIC COMPLEX PO) Take by mouth.   UNABLE TO FIND Med Name: Cholacol   [DISCONTINUED] traMADol  (ULTRAM ) 50 MG tablet Take 1 tablet (50 mg total) by mouth every 8 (eight) hours as needed.   No facility-administered encounter medications on file as of 12/17/2024.    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   Sarcoidosis    Seasonal allergies    Thyroid  dysfunction     NODULE ABN CELLS 2006   Past Surgical History:  Procedure Laterality Date   ABDOMINAL HYSTERECTOMY  1992   APPENDECTOMY  1978   CARPAL TUNNEL RELEASE Left 1979   CARPAL TUNNEL RELEASE Right 1980   CHOLECYSTECTOMY  1978   COLONOSCOPY  11/08/2014   Dr.Brodie   COSMETIC SURGERY  2008   FRACTURE SURGERY  2008   GALLBLADDER SURGERY  1978   JOINT REPLACEMENT  February 12, 2022   POLYPECTOMY     TIBIA FRACTURE SURGERY Left 2009   had 6 surgeries to repair and debride; and skin graft   TONSILLECTOMY AND ADENOIDECTOMY  1953   TOTAL ANKLE REPLACEMENT Left 02/12/2022   TUBAL LIGATION  1983   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  2011   Family History  Problem Relation Age of Onset   Diabetes Mother    Cancer Mother        BREAST, THYROID    Dementia Mother    Heart disease Father        MI X3   Cancer Father        lung   COPD Father    Hypertension Sister    Hypertension Brother     Heart failure Brother    Hypertension Son        mild    Colon cancer Neg Hx    Esophageal cancer Neg Hx    Stomach cancer Neg Hx    Social History   Occupational History   Occupation: retired  Tobacco Use   Smoking status: Never   Smokeless tobacco: Never  Vaping Use   Vaping status: Never Used  Substance and Sexual Activity   Alcohol use: Not Currently    Alcohol/week: 1.0 standard drink of alcohol    Types: 1 Glasses of wine per week    Comment: occ wine   Drug use: No   Sexual activity: Not Currently   Tobacco Counseling Counseling given: Not Answered  SDOH Screenings   Food Insecurity: No Food Insecurity (12/17/2024)  Housing: Low Risk (12/17/2024)  Transportation Needs: No Transportation Needs (12/17/2024)  Utilities: Not At Risk (12/17/2024)  Alcohol Screen: Low Risk (07/23/2024)  Depression (PHQ2-9): Low Risk (12/17/2024)  Financial Resource Strain: Low Risk (07/23/2024)  Physical  Activity: Insufficiently Active (12/17/2024)  Social Connections: Socially Integrated (12/17/2024)  Stress: No Stress Concern Present (12/17/2024)  Tobacco Use: Low Risk (12/17/2024)  Health Literacy: Adequate Health Literacy (12/07/2023)   See flowsheets for full screening details  Depression Screen PHQ 2 & 9 Depression Scale- Over the past 2 weeks, how often have you been bothered by any of the following problems? Little interest or pleasure in doing things: 0 Feeling down, depressed, or hopeless (PHQ Adolescent also includes...irritable): 0 PHQ-2 Total Score: 0 Trouble falling or staying asleep, or sleeping too much: 1 (sometimes uses otc sleep medication to fall asleep) Feeling tired or having little energy: 0 Poor appetite or overeating (PHQ Adolescent also includes...weight loss): 0 Feeling bad about yourself - or that you are a failure or have let yourself or your family down: 0 Trouble concentrating on things, such as reading the newspaper or watching television (PHQ  Adolescent also includes...like school work): 0 Moving or speaking so slowly that other people could have noticed. Or the opposite - being so fidgety or restless that you have been moving around a lot more than usual: 0 Thoughts that you would be better off dead, or of hurting yourself in some way: 0 PHQ-9 Total Score: 1 If you checked off any problems, how difficult have these problems made it for you to do your work, take care of things at home, or get along with other people?: Not difficult at all  Depression Treatment Depression Interventions/Treatment : EYV7-0 Score <4 Follow-up Not Indicated     Goals Addressed             This Visit's Progress    Patient Stated   On track    Maintain healthy active lifestyle.             Objective:    Today's Vitals   12/17/24 0759 12/17/24 0839  BP: (!) 163/67 (!) 142/76  Pulse: 70   Resp: 16   Temp: 97.9 F (36.6 C)   TempSrc: Oral   SpO2: 97%   Weight: 157 lb 3.2 oz (71.3 kg)   Height: 4' 11 (1.499 m)    Body mass index is 31.75 kg/m.  Hearing/Vision screen No results found. Immunizations and Health Maintenance Health Maintenance  Topic Date Due   COVID-19 Vaccine (8 - 2025-26 season) 09/02/2024   Medicare Annual Wellness (AWV)  12/17/2025   Bone Density Scan  06/25/2026   DTaP/Tdap/Td (3 - Td or Tdap) 06/18/2031   Pneumococcal Vaccine: 50+ Years  Completed   Influenza Vaccine  Completed   Hepatitis C Screening  Completed   Zoster Vaccines- Shingrix  Completed   Meningococcal B Vaccine  Aged Out   Mammogram  Discontinued   Colonoscopy  Discontinued        Assessment/Plan:  This is a routine wellness examination for [place].  Patient Care Team: Copland, Harlene BROCKS, MD as PCP - General (Family Medicine) Cloretta Arley NOVAK, MD (Internal Medicine) Claudene Elsie JUDITHANN Mickey., MD (Endocrinology) Robinson Mayo, OD as Referring Physician (Optometry)  I have personally reviewed and noted the following in the patients  chart:   Medical and social history Use of alcohol, tobacco or illicit drugs  Current medications and supplements including opioid prescriptions. Functional ability and status Nutritional status Physical activity Advanced directives List of other physicians Hospitalizations, surgeries, and ER visits in previous 12 months Vitals Screenings to include cognitive, depression, and falls Referrals and appointments  No orders of the defined types were placed in this encounter.  In addition, I have reviewed and discussed with patient certain preventive protocols, quality metrics, and best practice recommendations. A written personalized care plan for preventive services as well as general preventive health recommendations were provided to patient.   Lolita Libra, CMA   12/17/2024   Return in 1 year (on 12/17/2025).  After Visit Summary: (In Person-Printed) AVS printed and given to the patient  Nurse Notes: nothing significant to report  "

## 2025-01-28 ENCOUNTER — Encounter: Payer: Self-pay | Admitting: Family Medicine

## 2025-01-28 ENCOUNTER — Other Ambulatory Visit: Payer: Self-pay | Admitting: Medical Genetics

## 2025-01-31 NOTE — Patient Instructions (Incomplete)
 Good to see you again today- I will be in touch with your labs If all is well please see me in 6-9 months

## 2025-01-31 NOTE — Progress Notes (Unsigned)
 [facility] [address] [place], [place] [address] [phone]  Date:  02/04/2025   Name:  Kristina Mann   DOB:  08/17/1946   MRN:  [id number]  PCP:  Watt Harlene BROCKS, MD    Chief Complaint: No chief complaint on file.   History of Present Illness:  Bellany Elbaum is a 79 y.o. very pleasant female patient who presents with the following:  Patient seen today for periodic follow-up.  I saw her most recently in August  History of prediabetes, hypertension, sarcoidosis, osteopenia, subclinical hyperthyroidism, chronic left ankle swelling due to fracture 2009.  She did have her ankle replaced 2/23 at [facility] with improvement of her function Endocrinology let her go from care- we are now following her TSH for her   Discussed the use of AI scribe software for clinical note transcription with the patient, who gave verbal consent to proceed.  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   Arthralgia of left ankle 01/25/2020   Complication associated with orthopedic device 01/25/2020   Osteopenia 03/30/2017   Trigger finger, acquired 01/18/2017   Multinodular goiter 05/12/2016   Subclinical hyperthyroidism 05/12/2016   Pre-diabetes 06/16/2015   Low back pain 11/20/2013   Seasonal allergies 01/20/2012   Thyroid  nodule 01/20/2012   Sarcoidosis 01/20/2012   HTN (hypertension) 01/20/2012   Malignant basal cell neoplasm of skin 01/20/2012    Past Medical History:  Diagnosis Date   Allergy  1985   Basal cell cancer 06/2005   Patient denies any  cancer   Blood transfusion without reported diagnosis    Bronchitis    Chicken pox    GERD (gastroesophageal reflux disease)    Goiter    Hypertension    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   Sarcoidosis    Seasonal allergies    Thyroid  dysfunction     NODULE ABN CELLS 2006    Past Surgical History:  Procedure Laterality Date   ABDOMINAL HYSTERECTOMY  1992   APPENDECTOMY  1978   CARPAL TUNNEL RELEASE Left 1979   CARPAL TUNNEL RELEASE Right 1980   CHOLECYSTECTOMY  1978   COLONOSCOPY  11/08/2014   Dr.Brodie   COSMETIC SURGERY  2008   FRACTURE SURGERY  2008   GALLBLADDER SURGERY  1978   JOINT REPLACEMENT  February 12, 2022   POLYPECTOMY     TIBIA FRACTURE SURGERY Left 2009   had 6 surgeries to repair and debride; and skin graft   TONSILLECTOMY AND ADENOIDECTOMY  1953   TOTAL ANKLE REPLACEMENT Left 02/12/2022   TUBAL LIGATION  1983   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  2011    Social History[1]  Family History  Problem Relation Age of Onset   Diabetes Mother    Cancer Mother        BREAST, THYROID    Dementia Mother    Heart disease Father        MI X3   Cancer Father        lung   COPD Father    Hypertension Sister    Hypertension Brother  Heart failure Brother    Hypertension Son        mild    Colon cancer Neg Hx    Esophageal cancer Neg Hx    Stomach cancer Neg Hx     Allergies[2]  Medication list has been reviewed and updated.  Medications Ordered Prior to Encounter[3]  Review of Systems:  ***  Physical Examination: There were no vitals filed for this visit. There were no vitals filed for this visit. There is no height or weight on file to calculate BMI. Ideal Body Weight:    ***  Assessment and Plan: No diagnosis found.  Assessment & Plan   Signed Harlene Schroeder, MD    [1]  Social History Tobacco Use   Smoking status: Never   Smokeless tobacco: Never  Vaping Use   Vaping status: Never Used  Substance Use Topics   Alcohol use: Not  Currently    Alcohol/week: 1.0 standard drink of alcohol    Types: 1 Glasses of wine per week    Comment: occ wine   Drug use: No  [2]  Allergies Allergen Reactions   Hornet Venom Anaphylaxis   Codeine Nausea And Vomiting  [3]  Current Outpatient Medications on File Prior to Visit  Medication Sig Dispense Refill   ASHWAGANDHA PO Take by mouth.     B Complex-C-Folic Acid (HM SUPER VITAMIN B COMPLEX/C PO) Take by mouth.     calcium-vitamin D  250-100 MG-UNIT per tablet Take 1 tablet by mouth 2 (two) times daily.     Ferrous Fumarate (FERROCITE PO) Take by mouth.     fluticasone -salmeterol (ADVAIR ) 250-50 MCG/ACT AEPB Inhale 1 puff into the lungs in the morning and at bedtime. 180 each 3   MAGNESIUM GLYCINATE PO Take by mouth.     Multiple Vitamin (MULTIVITAMIN) capsule Take 1 capsule by mouth daily.     Nutritional Supplements (OSTEO ADVANCE PO) Take by mouth.     nystatin  (MYCOSTATIN /NYSTOP ) powder Apply 1 Application topically 3 (three) times daily. (Patient taking differently: Apply 1 Application topically 3 (three) times daily as needed.) 30 g 1   Omega-3 Fatty Acids (OMEGA 3 PO) Take by mouth.     Probiotic Product (PROBIOTIC DAILY PO) Take by mouth.     Specialty Vitamins Products (RETAINE VISION PO) Take by mouth.     STRONTIUM GLUCONATE-B6-B12-FA PO Take by mouth.     Turmeric (QC TUMERIC COMPLEX PO) Take by mouth.     UNABLE TO FIND Med Name: Cholacol     No current facility-administered medications on file prior to visit.   "

## 2025-02-04 ENCOUNTER — Ambulatory Visit: Admitting: Family Medicine

## 2025-02-04 DIAGNOSIS — E0590 Thyrotoxicosis, unspecified without thyrotoxic crisis or storm: Secondary | ICD-10-CM

## 2025-02-04 DIAGNOSIS — Z13220 Encounter for screening for lipoid disorders: Secondary | ICD-10-CM

## 2025-02-04 DIAGNOSIS — I10 Essential (primary) hypertension: Secondary | ICD-10-CM

## 2025-02-04 DIAGNOSIS — R7303 Prediabetes: Secondary | ICD-10-CM

## 2025-02-04 DIAGNOSIS — Z130 Encounter for screening for diseases of the blood and blood-forming organs and certain disorders involving the immune mechanism: Secondary | ICD-10-CM

## 2025-02-13 ENCOUNTER — Other Ambulatory Visit

## 2025-08-06 ENCOUNTER — Ambulatory Visit: Admitting: Dermatology

## 2025-12-18 ENCOUNTER — Ambulatory Visit
